# Patient Record
Sex: Female | Born: 1935 | Race: Black or African American | Hispanic: No | Smoking: Never smoker
Health system: Southern US, Community
[De-identification: ages and names within clinical notes are randomized; demographics above are authoritative.]

## PROBLEM LIST (undated history)

## (undated) DIAGNOSIS — M1990 Unspecified osteoarthritis, unspecified site: Secondary | ICD-10-CM

## (undated) DIAGNOSIS — F0390 Unspecified dementia without behavioral disturbance: Secondary | ICD-10-CM

## (undated) DIAGNOSIS — E079 Disorder of thyroid, unspecified: Secondary | ICD-10-CM

## (undated) DIAGNOSIS — C50919 Malignant neoplasm of unspecified site of unspecified female breast: Secondary | ICD-10-CM

## (undated) DIAGNOSIS — N289 Disorder of kidney and ureter, unspecified: Secondary | ICD-10-CM

## (undated) DIAGNOSIS — L209 Atopic dermatitis, unspecified: Secondary | ICD-10-CM

## (undated) DIAGNOSIS — E119 Type 2 diabetes mellitus without complications: Secondary | ICD-10-CM

## (undated) DIAGNOSIS — I4891 Unspecified atrial fibrillation: Secondary | ICD-10-CM

## (undated) DIAGNOSIS — I10 Essential (primary) hypertension: Secondary | ICD-10-CM

## (undated) DIAGNOSIS — F32A Depression, unspecified: Secondary | ICD-10-CM

## (undated) DIAGNOSIS — F329 Major depressive disorder, single episode, unspecified: Secondary | ICD-10-CM

## (undated) DIAGNOSIS — I509 Heart failure, unspecified: Secondary | ICD-10-CM

## (undated) HISTORY — PX: OTHER SURGICAL HISTORY: SHX169

## (undated) HISTORY — PX: MASTECTOMY: SHX3

## (undated) HISTORY — PX: HIP SURGERY: SHX245

---

## 2003-11-28 ENCOUNTER — Ambulatory Visit: Payer: Self-pay | Admitting: Oncology

## 2004-02-18 ENCOUNTER — Ambulatory Visit: Payer: Self-pay | Admitting: Oncology

## 2004-02-28 ENCOUNTER — Ambulatory Visit: Payer: Self-pay | Admitting: Oncology

## 2004-06-09 ENCOUNTER — Emergency Department: Payer: Self-pay | Admitting: Emergency Medicine

## 2004-06-16 ENCOUNTER — Ambulatory Visit: Payer: Self-pay | Admitting: Oncology

## 2004-06-24 ENCOUNTER — Ambulatory Visit: Payer: Self-pay | Admitting: Oncology

## 2004-06-27 ENCOUNTER — Ambulatory Visit: Payer: Self-pay | Admitting: Oncology

## 2004-08-12 ENCOUNTER — Ambulatory Visit: Payer: Self-pay | Admitting: Oncology

## 2004-08-18 ENCOUNTER — Ambulatory Visit: Payer: Self-pay | Admitting: Oncology

## 2004-11-17 ENCOUNTER — Ambulatory Visit: Payer: Self-pay | Admitting: Oncology

## 2004-11-27 ENCOUNTER — Ambulatory Visit: Payer: Self-pay | Admitting: Oncology

## 2005-03-02 ENCOUNTER — Ambulatory Visit: Payer: Self-pay | Admitting: Oncology

## 2005-03-30 ENCOUNTER — Ambulatory Visit: Payer: Self-pay | Admitting: Oncology

## 2005-05-11 ENCOUNTER — Ambulatory Visit: Payer: Self-pay | Admitting: Internal Medicine

## 2005-05-29 ENCOUNTER — Ambulatory Visit: Payer: Self-pay

## 2005-07-14 ENCOUNTER — Ambulatory Visit: Payer: Self-pay | Admitting: Oncology

## 2005-07-28 ENCOUNTER — Ambulatory Visit: Payer: Self-pay | Admitting: Oncology

## 2005-08-27 ENCOUNTER — Ambulatory Visit: Payer: Self-pay | Admitting: Oncology

## 2005-12-22 ENCOUNTER — Ambulatory Visit: Payer: Self-pay | Admitting: Oncology

## 2005-12-28 ENCOUNTER — Ambulatory Visit: Payer: Self-pay | Admitting: Oncology

## 2006-04-20 ENCOUNTER — Ambulatory Visit: Payer: Self-pay | Admitting: Oncology

## 2006-04-28 ENCOUNTER — Ambulatory Visit: Payer: Self-pay | Admitting: Oncology

## 2006-05-29 ENCOUNTER — Ambulatory Visit: Payer: Self-pay | Admitting: Unknown Physician Specialty

## 2006-08-24 ENCOUNTER — Ambulatory Visit: Payer: Self-pay | Admitting: Internal Medicine

## 2006-10-12 ENCOUNTER — Ambulatory Visit: Payer: Self-pay | Admitting: Internal Medicine

## 2006-10-19 ENCOUNTER — Ambulatory Visit: Payer: Self-pay | Admitting: Oncology

## 2006-10-29 ENCOUNTER — Ambulatory Visit: Payer: Self-pay | Admitting: Oncology

## 2006-10-29 ENCOUNTER — Ambulatory Visit: Payer: Self-pay | Admitting: Internal Medicine

## 2006-11-16 ENCOUNTER — Ambulatory Visit: Payer: Self-pay | Admitting: Unknown Physician Specialty

## 2006-11-30 ENCOUNTER — Inpatient Hospital Stay: Payer: Self-pay | Admitting: Unknown Physician Specialty

## 2006-12-05 ENCOUNTER — Encounter: Payer: Self-pay | Admitting: Internal Medicine

## 2007-03-31 ENCOUNTER — Ambulatory Visit: Payer: Self-pay | Admitting: Internal Medicine

## 2007-04-22 ENCOUNTER — Ambulatory Visit: Payer: Self-pay | Admitting: Oncology

## 2007-04-28 ENCOUNTER — Ambulatory Visit: Payer: Self-pay | Admitting: Internal Medicine

## 2007-05-21 ENCOUNTER — Ambulatory Visit: Payer: Self-pay | Admitting: Internal Medicine

## 2007-05-29 ENCOUNTER — Ambulatory Visit: Payer: Self-pay | Admitting: Internal Medicine

## 2007-08-26 ENCOUNTER — Ambulatory Visit: Payer: Self-pay | Admitting: Internal Medicine

## 2008-06-08 ENCOUNTER — Ambulatory Visit: Payer: Self-pay | Admitting: Family

## 2008-08-26 ENCOUNTER — Ambulatory Visit: Payer: Self-pay | Admitting: Internal Medicine

## 2008-09-01 ENCOUNTER — Ambulatory Visit: Payer: Self-pay | Admitting: Internal Medicine

## 2009-08-19 ENCOUNTER — Ambulatory Visit: Payer: Self-pay | Admitting: Internal Medicine

## 2009-08-24 ENCOUNTER — Ambulatory Visit: Payer: Self-pay | Admitting: Internal Medicine

## 2009-08-27 ENCOUNTER — Ambulatory Visit: Payer: Self-pay | Admitting: Oncology

## 2009-09-02 ENCOUNTER — Ambulatory Visit: Payer: Self-pay | Admitting: Internal Medicine

## 2009-09-15 ENCOUNTER — Ambulatory Visit: Payer: Self-pay | Admitting: Oncology

## 2009-09-17 LAB — CANCER ANTIGEN 27.29: CA 27.29: 23.1 U/mL

## 2009-09-27 ENCOUNTER — Ambulatory Visit: Payer: Self-pay | Admitting: Oncology

## 2009-09-28 ENCOUNTER — Ambulatory Visit: Payer: Self-pay | Admitting: Oncology

## 2010-09-07 ENCOUNTER — Ambulatory Visit: Payer: Self-pay | Admitting: Internal Medicine

## 2010-11-03 ENCOUNTER — Ambulatory Visit: Payer: Self-pay | Admitting: Oncology

## 2010-11-04 LAB — CANCER ANTIGEN 27.29: CA 27.29: 23.5 U/mL (ref 0.0–38.6)

## 2010-11-28 ENCOUNTER — Ambulatory Visit: Payer: Self-pay | Admitting: Oncology

## 2011-01-06 ENCOUNTER — Emergency Department: Payer: Self-pay | Admitting: Emergency Medicine

## 2011-04-12 ENCOUNTER — Ambulatory Visit: Payer: Self-pay | Admitting: Gastroenterology

## 2011-04-17 LAB — PATHOLOGY REPORT

## 2011-05-27 ENCOUNTER — Emergency Department: Payer: Self-pay | Admitting: *Deleted

## 2011-06-05 ENCOUNTER — Emergency Department: Payer: Self-pay | Admitting: Emergency Medicine

## 2011-06-05 LAB — COMPREHENSIVE METABOLIC PANEL
Anion Gap: 9 (ref 7–16)
BUN: 40 mg/dL — ABNORMAL HIGH (ref 7–18)
Bilirubin,Total: 0.4 mg/dL (ref 0.2–1.0)
Calcium, Total: 9.4 mg/dL (ref 8.5–10.1)
Co2: 27 mmol/L (ref 21–32)
EGFR (Non-African Amer.): 26 — ABNORMAL LOW
Glucose: 394 mg/dL — ABNORMAL HIGH (ref 65–99)
SGPT (ALT): 57 U/L
Sodium: 132 mmol/L — ABNORMAL LOW (ref 136–145)

## 2011-06-05 LAB — CBC
HCT: 35.5 % (ref 35.0–47.0)
MCV: 100 fL (ref 80–100)
Platelet: 201 10*3/uL (ref 150–440)
RDW: 13.8 % (ref 11.5–14.5)

## 2011-06-07 ENCOUNTER — Ambulatory Visit: Payer: Self-pay | Admitting: Oncology

## 2011-06-08 ENCOUNTER — Inpatient Hospital Stay: Payer: Self-pay | Admitting: Internal Medicine

## 2011-06-08 LAB — COMPREHENSIVE METABOLIC PANEL
Albumin: 4 g/dL (ref 3.4–5.0)
Alkaline Phosphatase: 119 U/L (ref 50–136)
Anion Gap: 11 (ref 7–16)
Calcium, Total: 8.8 mg/dL (ref 8.5–10.1)
Chloride: 94 mmol/L — ABNORMAL LOW (ref 98–107)
Co2: 27 mmol/L (ref 21–32)
EGFR (African American): 18 — ABNORMAL LOW
EGFR (Non-African Amer.): 16 — ABNORMAL LOW
Potassium: 5 mmol/L (ref 3.5–5.1)
SGOT(AST): 37 U/L (ref 15–37)
SGPT (ALT): 50 U/L
Sodium: 132 mmol/L — ABNORMAL LOW (ref 136–145)
Total Protein: 7.6 g/dL (ref 6.4–8.2)

## 2011-06-08 LAB — CBC
HGB: 10.8 g/dL — ABNORMAL LOW (ref 12.0–16.0)
MCH: 32.1 pg (ref 26.0–34.0)
MCHC: 31.8 g/dL — ABNORMAL LOW (ref 32.0–36.0)
MCV: 101 fL — ABNORMAL HIGH (ref 80–100)
Platelet: 181 10*3/uL (ref 150–440)

## 2011-06-08 LAB — URINALYSIS, COMPLETE
Ph: 5 (ref 4.5–8.0)
RBC,UR: 1 /HPF (ref 0–5)
Specific Gravity: 1.016 (ref 1.003–1.030)
Squamous Epithelial: <1

## 2011-06-08 LAB — CK TOTAL AND CKMB (NOT AT ARMC)
CK, Total: 78 U/L (ref 21–215)
CK-MB: 0.6 ng/mL (ref 0.5–3.6)

## 2011-06-08 LAB — HEMOGLOBIN A1C: Hemoglobin A1C: 12 % — ABNORMAL HIGH (ref 4.2–6.3)

## 2011-06-08 LAB — TROPONIN I: Troponin-I: <0.02 ng/mL

## 2011-06-09 LAB — LIPID PANEL
Cholesterol: 162 mg/dL (ref 0–200)
HDL Cholesterol: 38 mg/dL — ABNORMAL LOW (ref 40–60)

## 2011-06-09 LAB — CBC WITH DIFFERENTIAL/PLATELET
Basophil %: 0.2 %
Eosinophil #: 0 10*3/uL (ref 0.0–0.7)
HCT: 28.8 % — ABNORMAL LOW (ref 35.0–47.0)
HGB: 9.4 g/dL — ABNORMAL LOW (ref 12.0–16.0)
MCHC: 32.6 g/dL (ref 32.0–36.0)
Monocyte #: 0.4 x10 3/mm (ref 0.2–0.9)
Neutrophil #: 3.2 10*3/uL (ref 1.4–6.5)
Neutrophil %: 61 %
RBC: 2.9 10*6/uL — ABNORMAL LOW (ref 3.80–5.20)

## 2011-06-09 LAB — MAGNESIUM: Magnesium: 1.5 mg/dL — ABNORMAL LOW

## 2011-06-09 LAB — BASIC METABOLIC PANEL
Anion Gap: 11 (ref 7–16)
Co2: 22 mmol/L (ref 21–32)
EGFR (African American): 27 — ABNORMAL LOW
EGFR (Non-African Amer.): 23 — ABNORMAL LOW
Glucose: 103 mg/dL — ABNORMAL HIGH (ref 65–99)
Osmolality: 279 (ref 275–301)
Potassium: 4.3 mmol/L (ref 3.5–5.1)

## 2011-06-10 LAB — BASIC METABOLIC PANEL
BUN: 39 mg/dL — ABNORMAL HIGH (ref 7–18)
Calcium, Total: 8.4 mg/dL — ABNORMAL LOW (ref 8.5–10.1)
Chloride: 103 mmol/L (ref 98–107)
Co2: 23 mmol/L (ref 21–32)
EGFR (African American): 28 — ABNORMAL LOW
EGFR (Non-African Amer.): 24 — ABNORMAL LOW
Osmolality: 285 (ref 275–301)

## 2011-06-11 LAB — BASIC METABOLIC PANEL
Anion Gap: 9 (ref 7–16)
Co2: 24 mmol/L (ref 21–32)
Creatinine: 1.96 mg/dL — ABNORMAL HIGH (ref 0.60–1.30)
EGFR (Non-African Amer.): 23 — ABNORMAL LOW
Osmolality: 285 (ref 275–301)

## 2011-06-28 ENCOUNTER — Ambulatory Visit: Payer: Self-pay | Admitting: Oncology

## 2011-07-04 ENCOUNTER — Ambulatory Visit: Payer: Self-pay | Admitting: Oncology

## 2011-07-29 ENCOUNTER — Ambulatory Visit: Payer: Self-pay | Admitting: Oncology

## 2011-09-25 ENCOUNTER — Ambulatory Visit: Payer: Self-pay | Admitting: Internal Medicine

## 2012-04-27 ENCOUNTER — Ambulatory Visit: Payer: Self-pay | Admitting: Oncology

## 2012-05-21 LAB — CBC CANCER CENTER
Basophil #: 0 x10 3/mm (ref 0.0–0.1)
Basophil %: 0.3 %
Eosinophil #: 0 x10 3/mm (ref 0.0–0.7)
Eosinophil %: 0.9 %
HGB: 11.3 g/dL — ABNORMAL LOW (ref 12.0–16.0)
Lymphocyte #: 1.3 x10 3/mm (ref 1.0–3.6)
Lymphocyte %: 28.6 %
MCHC: 33.5 g/dL (ref 32.0–36.0)
MCV: 99 fL (ref 80–100)
Neutrophil %: 62.6 %
RDW: 14.4 % (ref 11.5–14.5)
WBC: 4.5 x10 3/mm (ref 3.6–11.0)

## 2012-05-21 LAB — COMPREHENSIVE METABOLIC PANEL
Albumin: 3.6 g/dL (ref 3.4–5.0)
BUN: 48 mg/dL — ABNORMAL HIGH (ref 7–18)
Calcium, Total: 8.9 mg/dL (ref 8.5–10.1)
Chloride: 102 mmol/L (ref 98–107)
Creatinine: 2.09 mg/dL — ABNORMAL HIGH (ref 0.60–1.30)
EGFR (African American): 24 — ABNORMAL LOW
Potassium: 4.3 mmol/L (ref 3.5–5.1)
SGPT (ALT): 34 U/L (ref 12–78)
Sodium: 138 mmol/L (ref 136–145)
Total Protein: 7.6 g/dL (ref 6.4–8.2)

## 2012-05-28 ENCOUNTER — Ambulatory Visit: Payer: Self-pay | Admitting: Oncology

## 2012-09-25 ENCOUNTER — Ambulatory Visit: Payer: Self-pay | Admitting: Oncology

## 2012-10-17 ENCOUNTER — Ambulatory Visit: Payer: Self-pay | Admitting: Oncology

## 2012-10-24 ENCOUNTER — Emergency Department: Payer: Self-pay | Admitting: Emergency Medicine

## 2012-10-24 LAB — URINALYSIS, COMPLETE
Bacteria: NONE SEEN
Bilirubin,UR: NEGATIVE
Nitrite: NEGATIVE
Protein: NEGATIVE
RBC,UR: NONE SEEN /HPF (ref 0–5)
Squamous Epithelial: 1
WBC UR: 1 /HPF (ref 0–5)

## 2012-10-24 LAB — COMPREHENSIVE METABOLIC PANEL
Anion Gap: 7 (ref 7–16)
BUN: 44 mg/dL — ABNORMAL HIGH (ref 7–18)
Bilirubin,Total: 0.3 mg/dL (ref 0.2–1.0)
Calcium, Total: 8.7 mg/dL (ref 8.5–10.1)
Chloride: 102 mmol/L (ref 98–107)
Co2: 27 mmol/L (ref 21–32)
Creatinine: 1.8 mg/dL — ABNORMAL HIGH (ref 0.60–1.30)
Glucose: 240 mg/dL — ABNORMAL HIGH (ref 65–99)
Potassium: 4.2 mmol/L (ref 3.5–5.1)
SGOT(AST): 30 U/L (ref 15–37)

## 2012-10-24 LAB — CBC
HCT: 29.3 % — ABNORMAL LOW (ref 35.0–47.0)
MCH: 33 pg (ref 26.0–34.0)
MCHC: 33.8 g/dL (ref 32.0–36.0)
Platelet: 185 10*3/uL (ref 150–440)
RBC: 3 10*6/uL — ABNORMAL LOW (ref 3.80–5.20)
RDW: 14.8 % — ABNORMAL HIGH (ref 11.5–14.5)
WBC: 3.2 10*3/uL — ABNORMAL LOW (ref 3.6–11.0)

## 2012-10-24 LAB — TROPONIN I: Troponin-I: <0.02 ng/mL

## 2012-12-01 ENCOUNTER — Emergency Department: Payer: Self-pay | Admitting: Emergency Medicine

## 2013-04-06 ENCOUNTER — Emergency Department: Payer: Self-pay | Admitting: Emergency Medicine

## 2013-04-06 LAB — CBC WITH DIFFERENTIAL/PLATELET
BANDS NEUTROPHIL: 7 %
BASOS ABS: 1 %
Eosinophil: 1 %
HCT: 31.3 % — AB (ref 35.0–47.0)
HGB: 10.4 g/dL — ABNORMAL LOW (ref 12.0–16.0)
Lymphocytes: 4 %
MCH: 32.7 pg (ref 26.0–34.0)
MCHC: 33.4 g/dL (ref 32.0–36.0)
MCV: 98 fL (ref 80–100)
MONOS PCT: 7 %
Platelet: 156 10*3/uL (ref 150–440)
RBC: 3.19 10*6/uL — AB (ref 3.80–5.20)
RDW: 14.5 % (ref 11.5–14.5)
Segmented Neutrophils: 80 %
WBC: 4.9 10*3/uL (ref 3.6–11.0)

## 2013-04-06 LAB — URINALYSIS, COMPLETE
Bacteria: NEGATIVE
Bilirubin,UR: NEGATIVE
Glucose,UR: NEGATIVE mg/dL (ref 0–75)
Ketone: NEGATIVE
Leukocyte Esterase: NEGATIVE
Nitrite: NEGATIVE
Ph: 5 (ref 4.5–8.0)
Protein: 100
Specific Gravity: 1.013 (ref 1.003–1.030)

## 2013-04-06 LAB — HEPATIC FUNCTION PANEL A (ARMC)
AST: 21 U/L (ref 15–37)
Albumin: 3.2 g/dL — ABNORMAL LOW (ref 3.4–5.0)
Alkaline Phosphatase: 69 U/L
Bilirubin, Direct: 0.1 mg/dL (ref 0.00–0.20)
Bilirubin,Total: 0.2 mg/dL (ref 0.2–1.0)
SGPT (ALT): 28 U/L (ref 12–78)
Total Protein: 7.2 g/dL (ref 6.4–8.2)

## 2013-04-06 LAB — BASIC METABOLIC PANEL
ANION GAP: 5 — AB (ref 7–16)
BUN: 40 mg/dL — ABNORMAL HIGH (ref 7–18)
CALCIUM: 8.5 mg/dL (ref 8.5–10.1)
Chloride: 105 mmol/L (ref 98–107)
Co2: 25 mmol/L (ref 21–32)
Creatinine: 1.87 mg/dL — ABNORMAL HIGH (ref 0.60–1.30)
EGFR (African American): 27 — ABNORMAL LOW
EGFR (Non-African Amer.): 23 — ABNORMAL LOW
GLUCOSE: 112 mg/dL — AB (ref 65–99)
OSMOLALITY: 281 (ref 275–301)
Potassium: 4 mmol/L (ref 3.5–5.1)
Sodium: 135 mmol/L — ABNORMAL LOW (ref 136–145)

## 2013-04-06 LAB — TROPONIN I: Troponin-I: <0.02 ng/mL

## 2013-04-06 LAB — MAGNESIUM: Magnesium: 1.6 mg/dL — ABNORMAL LOW

## 2013-04-06 LAB — LIPASE, BLOOD: Lipase: 78 U/L (ref 73–393)

## 2013-05-06 ENCOUNTER — Ambulatory Visit: Payer: Self-pay | Admitting: Ophthalmology

## 2013-05-19 ENCOUNTER — Ambulatory Visit: Payer: Self-pay | Admitting: Ophthalmology

## 2013-05-20 ENCOUNTER — Ambulatory Visit: Payer: Self-pay | Admitting: Oncology

## 2013-05-21 LAB — COMPREHENSIVE METABOLIC PANEL
ALT: 32 U/L (ref 12–78)
Albumin: 3.2 g/dL — ABNORMAL LOW (ref 3.4–5.0)
Alkaline Phosphatase: 72 U/L
Anion Gap: 9 (ref 7–16)
BUN: 45 mg/dL — AB (ref 7–18)
Bilirubin,Total: 0.2 mg/dL (ref 0.2–1.0)
CALCIUM: 9 mg/dL (ref 8.5–10.1)
CHLORIDE: 104 mmol/L (ref 98–107)
CREATININE: 1.76 mg/dL — AB (ref 0.60–1.30)
Co2: 27 mmol/L (ref 21–32)
EGFR (African American): 29 — ABNORMAL LOW
GFR CALC NON AF AMER: 25 — AB
GLUCOSE: 132 mg/dL — AB (ref 65–99)
Osmolality: 293 (ref 275–301)
POTASSIUM: 4.4 mmol/L (ref 3.5–5.1)
SGOT(AST): 26 U/L (ref 15–37)
Sodium: 140 mmol/L (ref 136–145)
Total Protein: 7.4 g/dL (ref 6.4–8.2)

## 2013-05-21 LAB — CBC CANCER CENTER
Basophil #: 0 x10 3/mm (ref 0.0–0.1)
Basophil %: 0.7 %
Eosinophil #: 0.1 x10 3/mm (ref 0.0–0.7)
Eosinophil %: 2.9 %
HCT: 33.1 % — ABNORMAL LOW (ref 35.0–47.0)
HGB: 10.6 g/dL — AB (ref 12.0–16.0)
LYMPHS ABS: 1.5 x10 3/mm (ref 1.0–3.6)
Lymphocyte %: 34.9 %
MCH: 31.4 pg (ref 26.0–34.0)
MCHC: 32 g/dL (ref 32.0–36.0)
MCV: 98 fL (ref 80–100)
MONO ABS: 0.3 x10 3/mm (ref 0.2–0.9)
Monocyte %: 8 %
Neutrophil #: 2.2 x10 3/mm (ref 1.4–6.5)
Neutrophil %: 53.5 %
PLATELETS: 208 x10 3/mm (ref 150–440)
RBC: 3.37 10*6/uL — ABNORMAL LOW (ref 3.80–5.20)
RDW: 15 % — ABNORMAL HIGH (ref 11.5–14.5)
WBC: 4.2 x10 3/mm (ref 3.6–11.0)

## 2013-05-28 ENCOUNTER — Ambulatory Visit: Payer: Self-pay | Admitting: Oncology

## 2013-06-27 ENCOUNTER — Ambulatory Visit: Payer: Self-pay | Admitting: Oncology

## 2014-05-19 DIAGNOSIS — I10 Essential (primary) hypertension: Secondary | ICD-10-CM | POA: Diagnosis not present

## 2014-05-19 DIAGNOSIS — R609 Edema, unspecified: Secondary | ICD-10-CM | POA: Diagnosis not present

## 2014-05-19 DIAGNOSIS — E119 Type 2 diabetes mellitus without complications: Secondary | ICD-10-CM | POA: Diagnosis not present

## 2014-05-19 DIAGNOSIS — D485 Neoplasm of uncertain behavior of skin: Secondary | ICD-10-CM | POA: Diagnosis not present

## 2014-06-05 DIAGNOSIS — C50919 Malignant neoplasm of unspecified site of unspecified female breast: Secondary | ICD-10-CM | POA: Diagnosis not present

## 2014-06-11 DIAGNOSIS — C44622 Squamous cell carcinoma of skin of right upper limb, including shoulder: Secondary | ICD-10-CM | POA: Diagnosis not present

## 2014-06-20 NOTE — Op Note (Signed)
PATIENT NAME:  Paige James, Paige James MR#:  [id number] DATE OF BIRTH:  1920/12/14  DATE OF PROCEDURE:  05/19/2013  Patient name Paige James [id number].   PREOPERATIVE DIAGNOSIS: Cataract, left eye.   POSTOPERATIVE DIAGNOSIS:  Cataract, left eye.  PROCEDURE PERFORMED: Extracapsular cataract extraction using phacoemulsification with placement of Alcon [id number], 22.0 diopter posterior chamber lens, serial number [id number].   SURGEON: Loura Back. Corney Knighton, M.D.   ANESTHESIA: 4% lidocaine and 0.75% Marcaine a 50-50 mixture with 10 units/mL of Hylenex added, given as a peribulbar.   ANESTHESIOLOGIST: Dr. [place] Service.   COMPLICATIONS: None.   ESTIMATED BLOOD LOSS: Less than 1 mL.   DESCRIPTION OF PROCEDURE: The patient was brought to the Operating Room and given IV sedation and peribulbar block. She was prepped and draped in the usual fashion. The vertical rectus muscles were imbricated using 5-0 silk sutures, bridle sutures. Hemostasis was obtained for 1 clock hour at the superior limbus and a partial-thickness scleral groove was dissected anteriorly into clear cornea with an Alcon crescent knife. The anterior chamber was entered superonasally and superotemporally through clear cornea with a paracentesis knife and air was used to replace the aqueous. Vision blue was used to paint the anterior capsule and then the air was replaced with balanced salt. The anterior chamber was entered superiorly through the lamellar dissection with a 2.6 mm keratome. DisCoVisc was used to place the aqueous and a continuous-tear circular capsulorrhexis was carried out. Hydrodissection was used to loosen the nucleus and phacoemulsification was carried out with a divide-and-conquer technique. Ultrasound time was 1 minute and 33 seconds, average power was 22.9% and CDE was 33.66. Irrigation-aspiration was used to remove the residual cortex. The capsular bag was inflated with DisCoVisc and the intraocular lens was inserted  in the capsular bag under direct visualization. Irrigation-aspiration was used to remove the residual DisCoVisc and the wound was inflated with balanced salt. Miostat was injected via the paracentesis track and a 0.1 mL of cefuroxime was also injected via the paracentesis track in 1 mg of fluid. The wound was checked for leaks. None were found. The bridle sutures were removed and 2 drops of Vigamox were placed in the eye. A shield was placed on the eye and the patient was discharged to the recovery room in good condition.     ____________________________ Loura Back Gagandeep Pettet, MD sad:cs D: 05/19/2013 13:57:09 ET T: 05/19/2013 15:56:04 ET JOB#: [id number]  cc: Remo Lipps A. Ellianna Ruest, MD, <Dictator> Martie Lee MD ELECTRONICALLY SIGNED 05/26/2013 13:28

## 2014-06-21 NOTE — Discharge Summary (Signed)
PATIENT NAME:  Paige James, Paige James MR#:  [id number] DATE OF BIRTH:  08/18/1922  DATE OF ADMISSION:  06/08/2011 DATE OF DISCHARGE:  06/12/2011  ADMITTING PHYSICIAN: Gladstone Lighter, MD   DISCHARGING PHYSICIAN: Gladstone Lighter, MD   PRIMARY MD: Clayborn Bigness, MD   PRIMARY ONCOLOGIST: Forest Gleason, MD   [place]: None.    DISCHARGE DIAGNOSES:  1. Metabolic encephalopathy on admission secondary to elevated blood glucose.  2. Acute renal failure.  3. Uncontrolled diabetes mellitus with HbA1c of 12.  4. Hypertension.  5. Hypothyroidism.   6. Right neck mass.  7. History of breast cancer.  8. Chronic kidney disease with baseline creatinine of 1.8.   DISCHARGE MEDICATIONS:  1. Norvasc 5 mg p.o. daily.  2. Celexa 10 mg p.o. daily.  3. Actos 30 mg p.o. daily.  4. Levothyroxine 100 mcg p.o. daily.  5. Glipizide 10 mg p.o. q.a.m. and 5 mg p.o. at bedtime.  6. Magnesium oxide 400 mg p.o. daily.   DISCHARGE DIET: Low sodium, ADA diet.   DISCHARGE ACTIVITY: As tolerated.    FOLLOW-UP INSTRUCTIONS:  1. Follow-up with PCP 1 to 2 weeks. 2. Follow-up with Dr. Oliva Bustard in two weeks.   LABS AND IMAGING STUDIES: Sodium 138, potassium 4.3, chloride 105, bicarb 24, BUN 40, creatinine 1.96, glucose 98, calcium 8.1. WBC 5.3, hemoglobin 9.4, hematocrit 28.8, platelet count 165. Serum magnesium 1.5 on admission. LDL 87, HDL 38, triglycerides 194, total cholesterol of 162.   CT of the head without contrast for altered mental status showing chronic involutional changes without any acute abnormalities. Urinalysis negative for any infection.   BRIEF HOSPITAL COURSE: Paige James is an 81 year old elderly African American female with past medical history significant for metastatic breast cancer status post right-sided mastectomy with chemoradiation in the past, follows up with Dr. Oliva Bustard, hypertension, noncompliant with meds for diabetes who presented to the hospital from Dr. Metro Kung  office after being confused and altered and blood sugars of 400 in his office. Her creatinine was also 2.6 on presentation with baseline creatinine is around 1.8 to 1.9.  1. Metabolic encephalopathy secondary to dehydration, acute renal failure, and also hyperglycemia.  2. Uncontrolled diabetes mellitus. HbA1c was 12.0. She was only on Actos at home. She was on Amaryl in the past but looks like she has not been taking it currently. She was put back on glipizide at 10 mg in the morning and 5 mg at bedtime and also Actos dose was increased to 30 mg p.o. daily which caused better control of blood sugar and she is being discharged on the same.  3. Hypertension. Continue Norvasc.  4. History of metastatic breast cancer with recent right neck mass in the aorta caval region seen on CT of the neck done for dysphagia. She was seen by Dr. Oliva Bustard and he wanted to get PET scan to see if breast cancer was coming back. PET scan was done in the hospital on 06/12/2011 prior to discharge, the results of which are not available but will follow-up with Dr. Oliva Bustard in the next 1 to 2 weeks.   Her course has been otherwise uneventful in the hospital. She worked with physical therapy who recommended home health but the patient says she walks usually pretty good and drives herself. She is not homebound and she refused home health. She is being discharged home.   DISCHARGE CONDITION: Stable.   DISCHARGE DISPOSITION: Home.   TIME SPENT ON DISCHARGE: 40 minutes.  ____________________________ Gladstone Lighter, MD rk:drc  D: 06/12/2011 15:53:13 ET T: 06/13/2011 14:18:08 ET JOB#: [id number]  cc: Gladstone Lighter, MD, <Dictator> Lavera Guise, MD Martie Lee. Oliva Bustard, MD Gladstone Lighter MD ELECTRONICALLY SIGNED 06/13/2011 15:06

## 2014-06-21 NOTE — H&P (Signed)
PATIENT NAME:  Paige James, SANAGUSTIN MR#:  [id number] DATE OF BIRTH:  08/18/1922  DATE OF ADMISSION:  06/08/2011  ADMITTING PHYSICIAN: Dr. Gladstone Lighter.  PRIMARY CARE PHYSICIAN: Dr. Clayborn Bigness. PRIMARY ONCOLOGIST: Dr. Oliva Bustard.   CHIEF COMPLAINT: Sent in from oncology office for altered mental status and hyperglycemia.   HISTORY OF PRESENT ILLNESS: Paige James is an 81 year old elderly African American female with past medical history significant for history of metastatic breast cancer, status post right-sided mastectomy with chemotherapy and radiation, currently negative for malignancy, history of diabetes, hypertension, hypothyroidism, who was sent in from Dr. Metro Kung office for hyperglycemia and altered mental status. The patient followed up with Dr. Oliva Bustard today for routine breast cancer follow-up and was found to be in remission. She is also being seen by Dr. Tamala Julian for abnormal mass on the right side of the neck and also dysphagia recently. However, she was found to be a little confused during her visit this morning and her fingerstick was found to be 400, so she was sent to the Emergency Room. Her blood glucose on BMP in the ER was 540 with acute on chronic renal failure with creatinine at 2.6 with baseline around 1.8. She is being admitted for the above complaints. Her mental status is starting to clear up with fluids.   PAST MEDICAL HISTORY:  1. Hypertension.  2. Hypothyroidism.  3. Non-insulin-dependent diabetes mellitus.  4. Breast cancer status post surgery chemotherapy and radiation  5. Migraine headaches.   PAST SURGICAL HISTORY:  1. Cataract surgery.  2. Right-sided mastectomy.  3. Right knee surgery.  4. Left knee total knee replacement.  5. Hip surgery.   ALLERGIES TO MEDICATIONS: No known drug allergies.   CURRENT MEDICATIONS:  1. Triamcinolone ointment as needed for rash.  2. Protonix 40 mg p.o. daily.  3. Actos 15 mg p.o. daily.  4. Synthroid 100 mcg p.o. daily.   5. Norvasc 5 mg p.o. daily.  6. Oxybutynin 10 mg p.o. daily.  7. Loratadine 5 mg p.o. daily.  8. Topical cream apply once a day for rash 9. Amaryl 4 mg in the morning and 2 mg at night. Not sure if the patient is still on this or not.  SOCIAL HISTORY: Lives at home by herself. No history of any smoking or alcohol use.   FAMILY HISTORY: The patient states one of her daughters had colon cancer, the other one had leukemia and brother with prostate cancer.    REVIEW OF SYSTEMS: CONSTITUTIONAL: No fever, fatigue, or weakness. EYES: Positive for blurred vision. No glaucoma. Positive for cataracts. ENT: No tinnitus, ear pain, hearing loss, epistaxis or discharge. RESPIRATORY: No cough, wheeze, hemoptysis, or chronic obstructive pulmonary disease. CARDIOVASCULAR: No chest pain, orthopnea, edema, arrhythmia, palpitations, or syncope. GASTROINTESTINAL: No nausea, vomiting, diarrhea, abdominal pain, hematemesis, or melena. GENITOURINARY: No dysuria, hematuria, frequency or incontinence. ENDOCRINE: No polyuria, nocturia, thyroid problems, heat or cold intolerance. HEMATOLOGY: No anemia, easy bruising or bleeding. SKIN: No acne, rash, or lesions. MUSCULOSKELETAL: No neck, back, or shoulder pain. Positive for hand pain secondary to arthritis. NEUROLOGIC: No numbness, weakness, cerebrovascular accident, transient ischemic attack, or seizures. PSYCHOLOGICAL: No anxiety, insomnia, or depression.   PHYSICAL EXAMINATION:  VITAL SIGNS: Temperature 97.2 degrees F., pulse 83, respirations 18, blood pressure 140/91, pulse ox 100% on room air.    GENERAL: Well built, well nourished female lying in bed, not in any acute distress.   HEENT: Normocephalic, atraumatic. Pupils equal, round, reacting to light. Anicteric sclerae. Extraocular movements intact. Oropharynx  clear without erythema, mass or exudates.   NECK: Supple. No thyromegaly, JVD, or carotid bruits. No lymphadenopathy.   LUNGS: Clear to auscultation.  Decreased bibasilar breath sounds. No wheeze or crackles. No use of accessory muscles for breathing.   CARDIOVASCULAR: S1, S2, regular rate and rhythm. No murmurs, rubs or gallops.   ABDOMEN: Soft, nontender. No hepatosplenomegaly. Normal bowel sounds.  EXTREMITIES: No pedal edema. No clubbing or cyanosis. 2+ dorsalis pedis pulses palpable bilaterally.   SKIN: No acne, rash, or lesions.   LYMPHATICS: No cervical or supraclavicular lymphadenopathy.   NEUROLOGIC: Cranial nerves intact. Follows simple commands. No motor or sensory deficits.   PSYCHOLOGICAL: The patient is awake, alert, oriented x2 with episodes of confusion.   LABORATORY, RADIOLOGICAL AND DIAGNOSTIC DATA: WBC 4.1, hemoglobin 10.8, hematocrit 34.0, platelet count 181. Sodium 132, potassium 5.0, chloride 94, bicarbonate 27, BUN 47, creatinine 2.64, glucose 514, calcium 8.8. ALT 50, AST 37, alkaline phosphatase 119, total bilirubin 0.4, albumin 4.0, troponin less than 0.02. Urinalysis: 1+ leukocyte esterase, 3 WBCs but no bacteria. Serum glucose improved to 189. She had a recent CT of the neck down on 06/05/2011 showing nonobstructing calculus is inflammatory in the left kidney, a mass within the aortocaval area in the abdomen cannot be excluded. No evidence of free fluid or pneumoperitoneum present.   ASSESSMENT AND PLAN: An 81 year old female with past medical history of breast cancer status post right mastectomy, chemotherapy and radiation following with Dr. Oliva Bustard, history of diabetes, hypertension, sent in for confusion and fingerstick of 400 from oncology clinic.  1. Altered mental status, likely metabolic encephalopathy from acute renal failure and hyperglycemia, seems to be improving now. IV fluids. Recheck labs and also get a CT of the head.  2. Diabetes mellitus with hyperglycemia. Hemoglobin A1c ordered. Her sugars improved after insulin dose in the ED. The patient is on Amaryl and Actos at home. Not sure if they are updated  from now or not, started her on low dose glipizide and also increased her Actos dose. Follow-up sugars and adjust dosage and will get a dietary consult.  3. Acute renal failure on CKD stage III, prerenal. IV fluids. Recheck in a.m. Baseline creatinine seems to be 1.8.  4. Hypothyroidism. Continue Synthroid. Follow-up TSH.  5. Hypertension. Norvasc.  6. Gastrointestinal and deep vein thrombosis prophylaxis. On Protonix and subcutaneous heparin.  7. CODE STATUS: FULL CODE.   TIME SPENT ON ADMISSION: 50 minutes.    ____________________________ Gladstone Lighter, MD rk:ap D: 06/08/2011 16:33:55 ET T: 06/08/2011 17:02:17 ET JOB#: [id number]  cc: Gladstone Lighter, MD, <Dictator> Janak K. Oliva Bustard, MD Gladstone Lighter MD ELECTRONICALLY SIGNED 06/09/2011 14:29

## 2014-07-20 DIAGNOSIS — I10 Essential (primary) hypertension: Secondary | ICD-10-CM | POA: Diagnosis not present

## 2014-07-20 DIAGNOSIS — N189 Chronic kidney disease, unspecified: Secondary | ICD-10-CM | POA: Diagnosis not present

## 2014-07-20 DIAGNOSIS — N184 Chronic kidney disease, stage 4 (severe): Secondary | ICD-10-CM | POA: Diagnosis not present

## 2014-07-20 DIAGNOSIS — D631 Anemia in chronic kidney disease: Secondary | ICD-10-CM | POA: Diagnosis not present

## 2014-07-21 DIAGNOSIS — I129 Hypertensive chronic kidney disease with stage 1 through stage 4 chronic kidney disease, or unspecified chronic kidney disease: Secondary | ICD-10-CM | POA: Diagnosis not present

## 2014-07-21 DIAGNOSIS — R609 Edema, unspecified: Secondary | ICD-10-CM | POA: Diagnosis not present

## 2014-07-21 DIAGNOSIS — E119 Type 2 diabetes mellitus without complications: Secondary | ICD-10-CM | POA: Diagnosis not present

## 2014-07-21 DIAGNOSIS — E039 Hypothyroidism, unspecified: Secondary | ICD-10-CM | POA: Diagnosis not present

## 2014-07-21 DIAGNOSIS — N184 Chronic kidney disease, stage 4 (severe): Secondary | ICD-10-CM | POA: Diagnosis not present

## 2014-07-21 DIAGNOSIS — R635 Abnormal weight gain: Secondary | ICD-10-CM | POA: Diagnosis not present

## 2014-07-21 DIAGNOSIS — I10 Essential (primary) hypertension: Secondary | ICD-10-CM | POA: Diagnosis not present

## 2014-10-19 DIAGNOSIS — N184 Chronic kidney disease, stage 4 (severe): Secondary | ICD-10-CM | POA: Diagnosis not present

## 2014-10-19 DIAGNOSIS — I10 Essential (primary) hypertension: Secondary | ICD-10-CM | POA: Diagnosis not present

## 2014-10-19 DIAGNOSIS — I129 Hypertensive chronic kidney disease with stage 1 through stage 4 chronic kidney disease, or unspecified chronic kidney disease: Secondary | ICD-10-CM | POA: Diagnosis not present

## 2014-11-03 DIAGNOSIS — N184 Chronic kidney disease, stage 4 (severe): Secondary | ICD-10-CM | POA: Diagnosis not present

## 2014-11-03 DIAGNOSIS — I129 Hypertensive chronic kidney disease with stage 1 through stage 4 chronic kidney disease, or unspecified chronic kidney disease: Secondary | ICD-10-CM | POA: Diagnosis not present

## 2014-11-03 DIAGNOSIS — E872 Acidosis: Secondary | ICD-10-CM | POA: Diagnosis not present

## 2014-11-12 DIAGNOSIS — I10 Essential (primary) hypertension: Secondary | ICD-10-CM | POA: Diagnosis not present

## 2014-11-12 DIAGNOSIS — Z0001 Encounter for general adult medical examination with abnormal findings: Secondary | ICD-10-CM | POA: Diagnosis not present

## 2014-11-12 DIAGNOSIS — E039 Hypothyroidism, unspecified: Secondary | ICD-10-CM | POA: Diagnosis not present

## 2014-11-12 DIAGNOSIS — F329 Major depressive disorder, single episode, unspecified: Secondary | ICD-10-CM | POA: Diagnosis not present

## 2014-11-12 DIAGNOSIS — L409 Psoriasis, unspecified: Secondary | ICD-10-CM | POA: Diagnosis not present

## 2014-11-12 DIAGNOSIS — E119 Type 2 diabetes mellitus without complications: Secondary | ICD-10-CM | POA: Diagnosis not present

## 2014-12-08 ENCOUNTER — Emergency Department: Payer: Medicare Other

## 2014-12-08 ENCOUNTER — Encounter: Payer: Self-pay | Admitting: Emergency Medicine

## 2014-12-08 ENCOUNTER — Inpatient Hospital Stay (HOSPITAL_COMMUNITY)
Admit: 2014-12-08 | Discharge: 2014-12-08 | Disposition: A | Discharge status: Home / Self Care | Payer: Medicare Other | Attending: Cardiovascular Disease | Admitting: Cardiovascular Disease

## 2014-12-08 ENCOUNTER — Inpatient Hospital Stay
Admission: EM | Admit: 2014-12-08 | Discharge: 2014-12-18 | DRG: 682 | Disposition: A | Discharge status: Hospice | Payer: Medicare Other | Attending: Internal Medicine | Admitting: Internal Medicine

## 2014-12-08 DIAGNOSIS — I340 Nonrheumatic mitral (valve) insufficiency: Secondary | ICD-10-CM

## 2014-12-08 DIAGNOSIS — J440 Chronic obstructive pulmonary disease with acute lower respiratory infection: Secondary | ICD-10-CM | POA: Diagnosis not present

## 2014-12-08 DIAGNOSIS — R944 Abnormal results of kidney function studies: Secondary | ICD-10-CM | POA: Diagnosis not present

## 2014-12-08 DIAGNOSIS — Z6834 Body mass index (BMI) 34.0-34.9, adult: Secondary | ICD-10-CM

## 2014-12-08 DIAGNOSIS — R0902 Hypoxemia: Secondary | ICD-10-CM | POA: Diagnosis not present

## 2014-12-08 DIAGNOSIS — N183 Chronic kidney disease, stage 3 (moderate): Secondary | ICD-10-CM | POA: Diagnosis not present

## 2014-12-08 DIAGNOSIS — E1122 Type 2 diabetes mellitus with diabetic chronic kidney disease: Secondary | ICD-10-CM | POA: Diagnosis present

## 2014-12-08 DIAGNOSIS — Z794 Long term (current) use of insulin: Secondary | ICD-10-CM

## 2014-12-08 DIAGNOSIS — J441 Chronic obstructive pulmonary disease with (acute) exacerbation: Secondary | ICD-10-CM | POA: Diagnosis present

## 2014-12-08 DIAGNOSIS — Z9889 Other specified postprocedural states: Secondary | ICD-10-CM

## 2014-12-08 DIAGNOSIS — J9811 Atelectasis: Secondary | ICD-10-CM | POA: Diagnosis not present

## 2014-12-08 DIAGNOSIS — D631 Anemia in chronic kidney disease: Secondary | ICD-10-CM | POA: Diagnosis present

## 2014-12-08 DIAGNOSIS — C50919 Malignant neoplasm of unspecified site of unspecified female breast: Secondary | ICD-10-CM | POA: Diagnosis present

## 2014-12-08 DIAGNOSIS — R0602 Shortness of breath: Secondary | ICD-10-CM | POA: Diagnosis not present

## 2014-12-08 DIAGNOSIS — R410 Disorientation, unspecified: Secondary | ICD-10-CM

## 2014-12-08 DIAGNOSIS — J90 Pleural effusion, not elsewhere classified: Secondary | ICD-10-CM | POA: Diagnosis not present

## 2014-12-08 DIAGNOSIS — R0600 Dyspnea, unspecified: Secondary | ICD-10-CM

## 2014-12-08 DIAGNOSIS — Z853 Personal history of malignant neoplasm of breast: Secondary | ICD-10-CM | POA: Diagnosis not present

## 2014-12-08 DIAGNOSIS — F0390 Unspecified dementia without behavioral disturbance: Secondary | ICD-10-CM | POA: Diagnosis present

## 2014-12-08 DIAGNOSIS — J810 Acute pulmonary edema: Secondary | ICD-10-CM

## 2014-12-08 DIAGNOSIS — M1990 Unspecified osteoarthritis, unspecified site: Secondary | ICD-10-CM | POA: Diagnosis present

## 2014-12-08 DIAGNOSIS — G9341 Metabolic encephalopathy: Secondary | ICD-10-CM | POA: Diagnosis not present

## 2014-12-08 DIAGNOSIS — G92 Toxic encephalopathy: Secondary | ICD-10-CM | POA: Diagnosis present

## 2014-12-08 DIAGNOSIS — N179 Acute kidney failure, unspecified: Principal | ICD-10-CM | POA: Diagnosis present

## 2014-12-08 DIAGNOSIS — I4891 Unspecified atrial fibrillation: Secondary | ICD-10-CM | POA: Diagnosis not present

## 2014-12-08 DIAGNOSIS — D71 Functional disorders of polymorphonuclear neutrophils: Secondary | ICD-10-CM | POA: Diagnosis present

## 2014-12-08 DIAGNOSIS — Z6832 Body mass index (BMI) 32.0-32.9, adult: Secondary | ICD-10-CM | POA: Diagnosis not present

## 2014-12-08 DIAGNOSIS — I129 Hypertensive chronic kidney disease with stage 1 through stage 4 chronic kidney disease, or unspecified chronic kidney disease: Secondary | ICD-10-CM | POA: Diagnosis present

## 2014-12-08 DIAGNOSIS — R32 Unspecified urinary incontinence: Secondary | ICD-10-CM | POA: Diagnosis present

## 2014-12-08 DIAGNOSIS — K219 Gastro-esophageal reflux disease without esophagitis: Secondary | ICD-10-CM | POA: Diagnosis present

## 2014-12-08 DIAGNOSIS — N184 Chronic kidney disease, stage 4 (severe): Secondary | ICD-10-CM | POA: Diagnosis present

## 2014-12-08 DIAGNOSIS — E039 Hypothyroidism, unspecified: Secondary | ICD-10-CM | POA: Diagnosis present

## 2014-12-08 DIAGNOSIS — C3490 Malignant neoplasm of unspecified part of unspecified bronchus or lung: Secondary | ICD-10-CM | POA: Diagnosis present

## 2014-12-08 DIAGNOSIS — N19 Unspecified kidney failure: Secondary | ICD-10-CM | POA: Diagnosis not present

## 2014-12-08 DIAGNOSIS — E440 Moderate protein-calorie malnutrition: Secondary | ICD-10-CM | POA: Diagnosis present

## 2014-12-08 DIAGNOSIS — Z7982 Long term (current) use of aspirin: Secondary | ICD-10-CM | POA: Diagnosis not present

## 2014-12-08 DIAGNOSIS — I481 Persistent atrial fibrillation: Secondary | ICD-10-CM | POA: Diagnosis not present

## 2014-12-08 DIAGNOSIS — R4182 Altered mental status, unspecified: Secondary | ICD-10-CM | POA: Diagnosis not present

## 2014-12-08 DIAGNOSIS — I5031 Acute diastolic (congestive) heart failure: Secondary | ICD-10-CM | POA: Diagnosis not present

## 2014-12-08 DIAGNOSIS — M6281 Muscle weakness (generalized): Secondary | ICD-10-CM | POA: Diagnosis not present

## 2014-12-08 DIAGNOSIS — I482 Chronic atrial fibrillation: Secondary | ICD-10-CM | POA: Diagnosis not present

## 2014-12-08 DIAGNOSIS — R911 Solitary pulmonary nodule: Secondary | ICD-10-CM | POA: Diagnosis present

## 2014-12-08 DIAGNOSIS — Z66 Do not resuscitate: Secondary | ICD-10-CM | POA: Diagnosis present

## 2014-12-08 DIAGNOSIS — I272 Other secondary pulmonary hypertension: Secondary | ICD-10-CM | POA: Diagnosis not present

## 2014-12-08 DIAGNOSIS — I890 Lymphedema, not elsewhere classified: Secondary | ICD-10-CM | POA: Diagnosis present

## 2014-12-08 DIAGNOSIS — D649 Anemia, unspecified: Secondary | ICD-10-CM | POA: Diagnosis present

## 2014-12-08 DIAGNOSIS — I509 Heart failure, unspecified: Secondary | ICD-10-CM

## 2014-12-08 DIAGNOSIS — N189 Chronic kidney disease, unspecified: Secondary | ICD-10-CM

## 2014-12-08 DIAGNOSIS — J9601 Acute respiratory failure with hypoxia: Secondary | ICD-10-CM | POA: Diagnosis present

## 2014-12-08 DIAGNOSIS — Z79899 Other long term (current) drug therapy: Secondary | ICD-10-CM | POA: Diagnosis not present

## 2014-12-08 DIAGNOSIS — I5043 Acute on chronic combined systolic (congestive) and diastolic (congestive) heart failure: Secondary | ICD-10-CM

## 2014-12-08 DIAGNOSIS — F329 Major depressive disorder, single episode, unspecified: Secondary | ICD-10-CM | POA: Diagnosis present

## 2014-12-08 DIAGNOSIS — Z515 Encounter for palliative care: Secondary | ICD-10-CM | POA: Diagnosis present

## 2014-12-08 DIAGNOSIS — Z9011 Acquired absence of right breast and nipple: Secondary | ICD-10-CM | POA: Diagnosis not present

## 2014-12-08 DIAGNOSIS — J209 Acute bronchitis, unspecified: Secondary | ICD-10-CM | POA: Diagnosis present

## 2014-12-08 DIAGNOSIS — I4819 Other persistent atrial fibrillation: Secondary | ICD-10-CM

## 2014-12-08 DIAGNOSIS — R062 Wheezing: Secondary | ICD-10-CM

## 2014-12-08 HISTORY — DX: Type 2 diabetes mellitus without complications: E11.9

## 2014-12-08 HISTORY — DX: Atopic dermatitis, unspecified: L20.9

## 2014-12-08 HISTORY — DX: Essential (primary) hypertension: I10

## 2014-12-08 HISTORY — DX: Unspecified osteoarthritis, unspecified site: M19.90

## 2014-12-08 HISTORY — DX: Disorder of kidney and ureter, unspecified: N28.9

## 2014-12-08 HISTORY — DX: Unspecified dementia, unspecified severity, without behavioral disturbance, psychotic disturbance, mood disturbance, and anxiety: F03.90

## 2014-12-08 HISTORY — DX: Major depressive disorder, single episode, unspecified: F32.9

## 2014-12-08 HISTORY — DX: Disorder of thyroid, unspecified: E07.9

## 2014-12-08 HISTORY — DX: Depression, unspecified: F32.A

## 2014-12-08 LAB — CBC
HEMATOCRIT: 28.6 % — AB (ref 35.0–47.0)
Hemoglobin: 9.5 g/dL — ABNORMAL LOW (ref 12.0–16.0)
MCH: 33.2 pg (ref 26.0–34.0)
MCHC: 33.3 g/dL (ref 32.0–36.0)
MCV: 99.6 fL (ref 80.0–100.0)
PLATELETS: 194 10*3/uL (ref 150–440)
RBC: 2.87 MIL/uL — AB (ref 3.80–5.20)
RDW: 15 % — ABNORMAL HIGH (ref 11.5–14.5)
WBC: 5.1 10*3/uL (ref 3.6–11.0)

## 2014-12-08 LAB — BASIC METABOLIC PANEL
Anion gap: 4 — ABNORMAL LOW (ref 5–15)
BUN: 40 mg/dL — AB (ref 6–20)
CHLORIDE: 105 mmol/L (ref 101–111)
CO2: 23 mmol/L (ref 22–32)
CREATININE: 2.64 mg/dL — AB (ref 0.44–1.00)
Calcium: 7.6 mg/dL — ABNORMAL LOW (ref 8.9–10.3)
GFR calc non Af Amer: 15 mL/min — ABNORMAL LOW (ref >60)
GFR, EST AFRICAN AMERICAN: 17 mL/min — AB (ref >60)
Glucose, Bld: 186 mg/dL — ABNORMAL HIGH (ref 65–99)
Potassium: 5 mmol/L (ref 3.5–5.1)
Sodium: 132 mmol/L — ABNORMAL LOW (ref 135–145)

## 2014-12-08 LAB — CBC WITH DIFFERENTIAL/PLATELET
BASOS ABS: 0 10*3/uL (ref 0–0.1)
Basophils Relative: 1 %
Eosinophils Absolute: 0 10*3/uL (ref 0–0.7)
Eosinophils Relative: 1 %
HEMATOCRIT: 28.2 % — AB (ref 35.0–47.0)
HEMOGLOBIN: 9.1 g/dL — AB (ref 12.0–16.0)
LYMPHS ABS: 0.9 10*3/uL — AB (ref 1.0–3.6)
Lymphocytes Relative: 18 %
MCH: 32.1 pg (ref 26.0–34.0)
MCHC: 32.2 g/dL (ref 32.0–36.0)
MCV: 99.8 fL (ref 80.0–100.0)
Monocytes Absolute: 0.5 10*3/uL (ref 0.2–0.9)
Monocytes Relative: 9 %
NEUTROS ABS: 3.6 10*3/uL (ref 1.4–6.5)
Neutrophils Relative %: 71 %
Platelets: 193 10*3/uL (ref 150–440)
RBC: 2.83 MIL/uL — ABNORMAL LOW (ref 3.80–5.20)
RDW: 15.4 % — ABNORMAL HIGH (ref 11.5–14.5)
WBC: 5.1 10*3/uL (ref 3.6–11.0)

## 2014-12-08 LAB — CREATININE, SERUM
Creatinine, Ser: 2.71 mg/dL — ABNORMAL HIGH (ref 0.44–1.00)
GFR calc non Af Amer: 14 mL/min — ABNORMAL LOW (ref >60)
GFR, EST AFRICAN AMERICAN: 16 mL/min — AB (ref >60)

## 2014-12-08 LAB — GLUCOSE, CAPILLARY: Glucose-Capillary: 66 mg/dL (ref 65–99)

## 2014-12-08 LAB — BRAIN NATRIURETIC PEPTIDE: B Natriuretic Peptide: 972 pg/mL — ABNORMAL HIGH (ref 0.0–100.0)

## 2014-12-08 LAB — TROPONIN I: Troponin I: 0.03 ng/mL (ref <0.031)

## 2014-12-08 LAB — TSH: TSH: 12.422 u[IU]/mL — ABNORMAL HIGH (ref 0.350–4.500)

## 2014-12-08 MED ORDER — INSULIN ASPART 100 UNIT/ML ~~LOC~~ SOLN
0.0000 [IU] | Freq: Three times a day (TID) | SUBCUTANEOUS | Status: DC
  Administered 2014-12-09 (×2): 1 [IU] via SUBCUTANEOUS
  Administered 2014-12-10: 2 [IU] via SUBCUTANEOUS
  Administered 2014-12-11: 5 [IU] via SUBCUTANEOUS
  Administered 2014-12-12: 2 [IU] via SUBCUTANEOUS
  Administered 2014-12-12: 1 [IU] via SUBCUTANEOUS
  Administered 2014-12-12: 3 [IU] via SUBCUTANEOUS
  Administered 2014-12-13: 1 [IU] via SUBCUTANEOUS
  Administered 2014-12-13: 5 [IU] via SUBCUTANEOUS
  Administered 2014-12-13: 2 [IU] via SUBCUTANEOUS
  Administered 2014-12-14: 7 [IU] via SUBCUTANEOUS
  Administered 2014-12-14: 3 [IU] via SUBCUTANEOUS
  Administered 2014-12-14: 2 [IU] via SUBCUTANEOUS
  Administered 2014-12-15: 5 [IU] via SUBCUTANEOUS
  Administered 2014-12-15: 7 [IU] via SUBCUTANEOUS
  Administered 2014-12-15: 3 [IU] via SUBCUTANEOUS
  Administered 2014-12-16 (×2): 7 [IU] via SUBCUTANEOUS
  Administered 2014-12-16 – 2014-12-17 (×2): 5 [IU] via SUBCUTANEOUS
  Administered 2014-12-17 (×2): 3 [IU] via SUBCUTANEOUS
  Administered 2014-12-18: 5 [IU] via SUBCUTANEOUS
  Administered 2014-12-18: 3 [IU] via SUBCUTANEOUS
  Administered 2014-12-18: 5 [IU] via SUBCUTANEOUS
  Filled 2014-12-08 (×2): qty 2
  Filled 2014-12-08: qty 5
  Filled 2014-12-08: qty 1
  Filled 2014-12-08: qty 2
  Filled 2014-12-08 (×3): qty 3
  Filled 2014-12-08: qty 1
  Filled 2014-12-08: qty 7
  Filled 2014-12-08 (×2): qty 5
  Filled 2014-12-08: qty 2
  Filled 2014-12-08 (×2): qty 5
  Filled 2014-12-08: qty 1
  Filled 2014-12-08: qty 3
  Filled 2014-12-08: qty 1
  Filled 2014-12-08: qty 3
  Filled 2014-12-08: qty 1
  Filled 2014-12-08 (×2): qty 7
  Filled 2014-12-08: qty 5
  Filled 2014-12-08: qty 3
  Filled 2014-12-08: qty 7
  Filled 2014-12-08: qty 5

## 2014-12-08 MED ORDER — PANTOPRAZOLE SODIUM 40 MG PO TBEC
40.0000 mg | DELAYED_RELEASE_TABLET | Freq: Every day | ORAL | Status: DC
  Administered 2014-12-08 – 2014-12-18 (×11): 40 mg via ORAL
  Filled 2014-12-08 (×11): qty 1

## 2014-12-08 MED ORDER — VITAMIN D (ERGOCALCIFEROL) 1.25 MG (50000 UNIT) PO CAPS
50000.0000 [IU] | ORAL_CAPSULE | ORAL | Status: DC
  Administered 2014-12-15: 50000 [IU] via ORAL
  Filled 2014-12-08 (×2): qty 1

## 2014-12-08 MED ORDER — FLUOXETINE HCL 10 MG PO CAPS
10.0000 mg | ORAL_CAPSULE | Freq: Every day | ORAL | Status: DC
  Administered 2014-12-09 – 2014-12-18 (×10): 10 mg via ORAL
  Filled 2014-12-08 (×11): qty 1

## 2014-12-08 MED ORDER — ASPIRIN EC 81 MG PO TBEC
81.0000 mg | DELAYED_RELEASE_TABLET | Freq: Every day | ORAL | Status: DC
  Administered 2014-12-08 – 2014-12-18 (×11): 81 mg via ORAL
  Filled 2014-12-08 (×11): qty 1

## 2014-12-08 MED ORDER — GLIPIZIDE 10 MG PO TABS
5.0000 mg | ORAL_TABLET | Freq: Two times a day (BID) | ORAL | Status: DC
  Administered 2014-12-09: 5 mg via ORAL
  Filled 2014-12-08: qty 1

## 2014-12-08 MED ORDER — HEPARIN SODIUM (PORCINE) 5000 UNIT/ML IJ SOLN
5000.0000 [IU] | Freq: Three times a day (TID) | INTRAMUSCULAR | Status: DC
  Administered 2014-12-09 – 2014-12-17 (×21): 5000 [IU] via SUBCUTANEOUS
  Filled 2014-12-08 (×22): qty 1

## 2014-12-08 MED ORDER — FUROSEMIDE 10 MG/ML IJ SOLN
20.0000 mg | Freq: Two times a day (BID) | INTRAMUSCULAR | Status: DC
  Filled 2014-12-08: qty 2

## 2014-12-08 MED ORDER — ADULT MULTIVITAMIN W/MINERALS CH
1.0000 | ORAL_TABLET | Freq: Every day | ORAL | Status: DC
  Administered 2014-12-09 – 2014-12-18 (×10): 1 via ORAL
  Filled 2014-12-08 (×11): qty 1

## 2014-12-08 MED ORDER — SODIUM CHLORIDE 0.9 % IJ SOLN
3.0000 mL | Freq: Two times a day (BID) | INTRAMUSCULAR | Status: DC
  Administered 2014-12-08 – 2014-12-18 (×13): 3 mL via INTRAVENOUS

## 2014-12-08 MED ORDER — VITAMINS A & D EX OINT
1.0000 "application " | TOPICAL_OINTMENT | CUTANEOUS | Status: DC
  Administered 2014-12-16: 1 via TOPICAL
  Filled 2014-12-08 (×2): qty 5

## 2014-12-08 MED ORDER — ACETAMINOPHEN 325 MG PO TABS: 650.0000 mg | ORAL_TABLET | ORAL | Status: DC | PRN

## 2014-12-08 MED ORDER — ONDANSETRON HCL 4 MG/2ML IJ SOLN: 4.0000 mg | Freq: Four times a day (QID) | INTRAMUSCULAR | Status: DC | PRN

## 2014-12-08 MED ORDER — CETIRIZINE HCL 10 MG PO TABS
10.0000 mg | ORAL_TABLET | Freq: Every day | ORAL | Status: DC
  Administered 2014-12-08 – 2014-12-18 (×11): 10 mg via ORAL
  Filled 2014-12-08 (×11): qty 1

## 2014-12-08 MED ORDER — CARVEDILOL 3.125 MG PO TABS
3.1250 mg | ORAL_TABLET | Freq: Two times a day (BID) | ORAL | Status: DC
  Administered 2014-12-08: 3.125 mg via ORAL
  Filled 2014-12-08: qty 1

## 2014-12-08 MED ORDER — AMLODIPINE BESYLATE 5 MG PO TABS
5.0000 mg | ORAL_TABLET | Freq: Every day | ORAL | Status: DC
  Filled 2014-12-08: qty 1

## 2014-12-08 MED ORDER — FUROSEMIDE 10 MG/ML IJ SOLN
40.0000 mg | Freq: Once | INTRAMUSCULAR | Status: AC
  Administered 2014-12-08: 40 mg via INTRAVENOUS
  Filled 2014-12-08: qty 4

## 2014-12-08 MED ORDER — SODIUM CHLORIDE 0.9 % IJ SOLN
3.0000 mL | INTRAMUSCULAR | Status: DC | PRN
  Administered 2014-12-15: 3 mL via INTRAVENOUS
  Filled 2014-12-08: qty 10

## 2014-12-08 MED ORDER — OLOPATADINE HCL 0.1 % OP SOLN
1.0000 [drp] | Freq: Every day | OPHTHALMIC | Status: DC
  Administered 2014-12-09 – 2014-12-18 (×10): 1 [drp] via OPHTHALMIC
  Filled 2014-12-08 (×2): qty 5

## 2014-12-08 MED ORDER — SODIUM BICARBONATE 650 MG PO TABS
650.0000 mg | ORAL_TABLET | Freq: Two times a day (BID) | ORAL | Status: DC
  Administered 2014-12-08 – 2014-12-18 (×19): 650 mg via ORAL
  Filled 2014-12-08 (×19): qty 1

## 2014-12-08 MED ORDER — SODIUM CHLORIDE 0.9 % IV SOLN: 250.0000 mL | INTRAVENOUS | Status: DC | PRN

## 2014-12-08 MED ORDER — OXYBUTYNIN CHLORIDE ER 5 MG PO TB24
10.0000 mg | ORAL_TABLET | Freq: Every day | ORAL | Status: DC
  Administered 2014-12-09 – 2014-12-18 (×10): 10 mg via ORAL
  Filled 2014-12-08 (×12): qty 2

## 2014-12-08 MED ORDER — INSULIN ASPART 100 UNIT/ML ~~LOC~~ SOLN
0.0000 [IU] | Freq: Every day | SUBCUTANEOUS | Status: DC
  Administered 2014-12-11 – 2014-12-14 (×3): 3 [IU] via SUBCUTANEOUS
  Administered 2014-12-15: 5 [IU] via SUBCUTANEOUS
  Administered 2014-12-16: 3 [IU] via SUBCUTANEOUS
  Administered 2014-12-17: 2 [IU] via SUBCUTANEOUS
  Filled 2014-12-08: qty 3
  Filled 2014-12-08: qty 2
  Filled 2014-12-08: qty 3
  Filled 2014-12-08: qty 5
  Filled 2014-12-08: qty 3
  Filled 2014-12-08: qty 2

## 2014-12-08 MED ORDER — LEVOTHYROXINE SODIUM 100 MCG PO TABS
100.0000 ug | ORAL_TABLET | Freq: Every day | ORAL | Status: DC
  Administered 2014-12-09: 100 ug via ORAL
  Filled 2014-12-08: qty 1

## 2014-12-08 MED ORDER — LORAZEPAM 0.5 MG PO TABS
0.5000 mg | ORAL_TABLET | Freq: Two times a day (BID) | ORAL | Status: DC | PRN
  Administered 2014-12-10: 0.5 mg via ORAL
  Filled 2014-12-08: qty 1

## 2014-12-08 NOTE — Consult Note (Signed)
Cardiology Consultation Note  Patient ID: Paige James, MRN: [id number], DOB/AGE: [birth year] 81 y.o. Admit date: 12/08/2014   Date of Consult: 12/08/2014 Primary Physician: No primary care provider on file. Primary Cardiologist: No cardiologist  Chief Complaint: Shortness of breath, weakness Reason for Consult: New onset atrial fibrillation, CHF  HPI: 81 y.o. female with a known history of Metastatic breast cancer status post right-sided mastectomy with chemotherapy and radiation, currently negative for malignancy, history of diabetes, hypertension, hypothyroidism who lives at [facility] assisted [facility]. She presents today with 3-4 days of worsening shortness of breath, weakness, giving out. Found to be in atrial fibrillation in the emergency room.  daughter called EMS for worsening shortness of breath on exertion, unable to ambulate as she did in the past. She just gives out with several steps. Mild cough, nothing significant.  Family does report chronic shortness of breath, worse in the past several days. Possibly some mild leg edema  On EMS arrival, per report, EMS noted wheezes and O2 sats on RA were 83%.   Pt arrived to ED with 4L n/c with adequate O2 sats.   She has been requiring 2 pillows to lay downand being comfortable. She denies any fever. Family was unaware she was in atrial fibrillation. He denies smoking history, no prior heart attack  Past Medical History  Diagnosis Date  . Renal disorder   . Diabetes mellitus without complication ([place])   . Thyroid disease   . Senile dementia   . Hypertension   . Atopic dermatitis   . Osteoarthritis   . Depressive disorder       Most Recent Cardiac Studies: Echocardiogram pending    Surgical History: History reviewed. No pertinent past surgical history.   Home Meds: Prior to Admission medications   Medication Sig Start Date End Date Taking? Authorizing Provider  amLODipine (NORVASC) 5 MG tablet Take 5 mg by  mouth daily.   Yes Historical Provider, MD  carvedilol (COREG) 3.125 MG tablet Take 3.125 mg by mouth 2 (two) times daily.   Yes Historical Provider, MD  FLUoxetine (PROZAC) 10 MG capsule Take 10 mg by mouth daily.   Yes Historical Provider, MD  glipiZIDE (GLUCOTROL) 5 MG tablet Take 5 mg by mouth 2 (two) times daily with a meal.   Yes Historical Provider, MD  levocetirizine (XYZAL) 5 MG tablet Take 5 mg by mouth daily.   Yes Historical Provider, MD  levothyroxine (SYNTHROID, LEVOTHROID) 100 MCG tablet Take 100 mcg by mouth daily before breakfast.   Yes Historical Provider, MD  LORazepam (ATIVAN) 0.5 MG tablet Take 0.5 mg by mouth 2 (two) times daily as needed (for agitation).   Yes Historical Provider, MD  Multiple Vitamin (MULTIVITAMIN WITH MINERALS) TABS tablet Take 1 tablet by mouth daily.   Yes Historical Provider, MD  Olopatadine HCl (PAZEO) 0.7 % SOLN Apply 1 drop to eye daily.   Yes Historical Provider, MD  oxybutynin (DITROPAN-XL) 10 MG 24 hr tablet Take 10 mg by mouth daily.   Yes Historical Provider, MD  pantoprazole (PROTONIX) 40 MG tablet Take 40 mg by mouth daily.   Yes Historical Provider, MD  sodium bicarbonate 650 MG tablet Take 650 mg by mouth 2 (two) times daily.   Yes Historical Provider, MD  Vitamin D, Ergocalciferol, (DRISDOL) 50000 UNITS CAPS capsule Take 50,000 Units by mouth every 7 (seven) days. Pt takes on Wednesday.   Yes Historical Provider, MD  Vitamins A & D (VITAMIN A & D) ointment Apply 1 application  topically every Monday, Wednesday, and Friday.   Yes Historical Provider, MD    Inpatient Medications:  . furosemide  40 mg Intravenous Once  . insulin aspart  0-5 Units Subcutaneous QHS  . insulin aspart  0-9 Units Subcutaneous TID WC      Allergies: No Known Allergies  Social History   Social History  . Marital Status: Widowed    Spouse Name: N/A  . Number of Children: N/A  . Years of Education: N/A   Occupational History  . Not on file.   Social  History Main Topics  . Smoking status: Never Smoker   . Smokeless tobacco: Not on file  . Alcohol Use: Not on file  . Drug Use: Not on file  . Sexual Activity: Not on file   Other Topics Concern  . Not on file   Social History Narrative  . No narrative on file     No family history on file.   Review of Systems :Review of Systems  Constitutional: Positive for malaise/fatigue.  Respiratory: Positive for cough, shortness of breath and wheezing.   Cardiovascular: Positive for leg swelling.  Gastrointestinal: Negative.   Musculoskeletal: Negative.   Neurological: Positive for weakness.  Psychiatric/Behavioral: Negative.   All other systems reviewed and are negative.   Labs:  Recent Labs  12/08/14 1319  TROPONINI 0.03   Lab Results  Component Value Date   WBC 5.1 12/08/2014   HGB 9.1* 12/08/2014   HCT 28.2* 12/08/2014   MCV 99.8 12/08/2014   PLT 193 12/08/2014    Recent Labs Lab 12/08/14 1319  NA 132*  K 5.0  CL 105  CO2 23  BUN 40*  CREATININE 2.64*  CALCIUM 7.6*  GLUCOSE 186*   Lab Results  Component Value Date   CHOL 162 06/09/2011   HDL 38* 06/09/2011   LDLCALC 85 06/09/2011   TRIG 194 06/09/2011   No results found for: DDIMER  Radiology/Studies:  Dg Chest 2 View  12/08/2014   CLINICAL DATA:  81 year old with acute confusion superimposed upon baseline dementia. Wheezing and hypoxemia identified upon EMS arrival. Personal history of breast cancer post right mastectomy.  EXAM: CHEST  2 VIEW  COMPARISON:  CT chest 06/03/2013. Chest x-rays 04/06/2013 and earlier.  FINDINGS: Cardiac silhouette mildly enlarged for technique, unchanged. Mild-to-moderate diffuse interstitial pulmonary edema. Bilateral pleural effusions and associated mild consolidation in the lower lobes.  IMPRESSION: Mild to moderate acute CHF, with stable mild cardiomegaly and mild to moderate diffuse interstitial pulmonary edema. Bilateral pleural effusions and associated passive  atelectasis in the lower lobes.   Electronically Signed   By: Evangeline Dakin M.D.   On: 12/08/2014 13:37    EKG: Atrial fibrillation with rate in the 80s to 90s  Weights: There were no vitals filed for this visit.   Physical Exam: Telemetry showing atrial fibrillation, rate up to 100 bpm at rest Blood pressure 134/100, pulse 78, temperature 98.8 F (37.1 C), temperature source Oral, resp. rate 29, height '5\' 5"'$  (1.651 m), SpO2 98 %. There is no weight on file to calculate BMI. General: Well developed, well nourished, in no acute distress. Head: Normocephalic, atraumatic, sclera non-icteric, no xanthomas, nares are without discharge.  Neck: Negative for carotid bruits. JVD not elevated. Lungs: Wheezing, diffuse rails  Heart: IRRR  No murmurs, rubs, or gallops appreciated. Abdomen: Soft, non-tender, non-distended with active bowel sounds. No hepatomegaly. No rebound/guarding. No obvious abdominal masses. Msk:  Strength and tone appear normal for age. Extremities: No clubbing or  cyanosis. No edema.  Distal pedal pulses are 2+ and equal bilaterally. Neuro: Alert and oriented X 3. No facial asymmetry. No focal deficit. Moves all extremities spontaneously. Psych:  Responds to questions appropriately with a normal affect.    Assessment and Plan:  81 y.o. female with a history of Metastatic breast cancer status post right-sided mastectomy with chemotherapy and radiation, currently negative for malignancy, history of diabetes, hypertension, hypothyroidism who lives at [facility] assisted living home. She presents today with 3-4 days of worsening shortness of breath, weakness, giving out. Found to be in atrial fibrillation in the emergency room.  1) respiratory distress, hypoxia   secondary to CHF. No prior smoking history to explain her wheezing. Echocardiogram pending. Ejection fraction unclear at this time Chest x-ray confirming pulmonary edema, pleural effusions No known coronary artery  disease, cardiac enzymes negative Possibly exacerbated by new atrial fibrillation --She is scheduled to receive Lasix 40 mg IV in the emergency room   would continue Lasix 40 mg IV twice a day  2) atrial fibrillation Possibly persistent. Timing of onset is unclear. Unable to exclude onset of atrial fibrillation 4 days ago given symptoms got worse at that time. May have had chronic atrial fibrillation for long period time, CHF symptoms in the past several days.  --Not a good candidate for anticoagulation given her anemia, high fall risk. Family confirms she has had recent fall. Should be walking with a walker, only walks with a cane at the nursing home. --We'll start beta blocker for rate control, metoprolol 25 mg twice a day with slow titration upwards as tolerated. Echo pending  3) chronic renal failure, stage IV Worse than 2015, creatinine now 2.5 We'll need to proceed with Lasix cautiously She reports that she sees nephrology  4) anemia Likely contributing to her shortness of breath symptoms, tachycardia Unclear if this is iron deficiency, would run a iron panel A need to discuss with renal she is a candidate for EPO   5) Breast cancer, history of mastectomy   surgery on the right with lymphedema of the right arm Seen by oncology, by report is in remission  6) pleural effusions Seen on chest x-ray Secondary to CHF  Signed, Esmond Plants, MD [facility] 12/08/2014, 4:43 PM

## 2014-12-08 NOTE — H&P (Signed)
[facility] [place] NAME: Paige James    MR#:  [id number]  DATE OF BIRTH:  1920-12-27  DATE OF ADMISSION:  12/08/2014  PRIMARY CARE PHYSICIAN: Clayborn Bigness MD   REQUESTING/REFERRING PHYSICIAN: Nance Pear MD  CHIEF COMPLAINT:   Chief Complaint  Patient presents with  . Altered Mental Status  Hypoxia HISTORY OF PRESENT ILLNESS:  Paige James  is a 81 y.o. female with a known history of Metastatic breast cancer status post right-sided mastectomy with chemotherapy and radiation, currently negative for malignancy, history of diabetes, hypertension, hypothyroidism who lives at [facility]. Her daughter called EMS due to her concerns that she was talking about people who have been deceased over the phone with her today. Pt does have baseline dementia, but daughter noted her to be more altered than normal. On EMS arrival, per report, EMS noted wheezes and O2 sats on RA were 83%. Pt arrived to ED with 4L n/c with adequate O2 sats. Pt pleasant, alert and oriented to person and place. On further discussion.  Her daughter mentioned patient regarding cold for about 4 days.  She has also been having shortness of breath on and off for couple of weeks with her not able to walk from bed to the door without getting short of breath lately.  She has been requiring 2 pillows to lay downand being comfortable.  She denies any fever.  She was noted to be in new onset atrial fibrillation while in the emergency department. PAST MEDICAL HISTORY:   Past Medical History  Diagnosis Date  . Renal disorder   . Diabetes mellitus without complication ([place])   . Thyroid disease   . Senile dementia   . Hypertension   . Atopic dermatitis   . Osteoarthritis   . Depressive disorder    PAST SURGICAL HISTORY:  History reviewed. No pertinent past surgical history.  1.   Cataract surgery.   2.   Right-sided mastectomy.   3.   Right knee  surgery.   4.   Left knee total knee replacement.   5.   Hip surgery.   SOCIAL HISTORY:   Social History  Substance Use Topics  . Smoking status: Never Smoker   . Smokeless tobacco: Not on file  . Alcohol Use: Not on file  She lives it [facility] assisted [facility] FAMILY HISTORY:  No family history on file. one of her daughters had colon cancer, the other one had leukemia and brother with prostate cancer.   DRUG ALLERGIES:  No Known Allergies REVIEW OF SYSTEMS:   Review of Systems  Constitutional: Negative for fever, weight loss, malaise/fatigue and diaphoresis.  HENT: Negative for ear discharge, ear pain, hearing loss, nosebleeds, sore throat and tinnitus.   Eyes: Negative for blurred vision and pain.  Respiratory: Positive for shortness of breath and wheezing. Negative for cough and hemoptysis.   Cardiovascular: Positive for orthopnea and PND. Negative for chest pain, palpitations and leg swelling.  Gastrointestinal: Negative for heartburn, nausea, vomiting, abdominal pain, diarrhea, constipation and blood in stool.  Genitourinary: Negative for dysuria, urgency and frequency.  Musculoskeletal: Negative for myalgias and back pain.  Skin: Negative for itching and rash.  Neurological: Negative for dizziness, tingling, tremors, focal weakness, seizures, weakness and headaches.  Psychiatric/Behavioral: Positive for memory loss. Negative for depression. The patient is not nervous/anxious.    MEDICATIONS AT HOME:   Prior to Admission medications   Medication Sig Start Date End Date Taking? Authorizing  Provider  amLODipine (NORVASC) 5 MG tablet Take 5 mg by mouth daily.   Yes Historical Provider, MD  carvedilol (COREG) 3.125 MG tablet Take 3.125 mg by mouth 2 (two) times daily.   Yes Historical Provider, MD  FLUoxetine (PROZAC) 10 MG capsule Take 10 mg by mouth daily.   Yes Historical Provider, MD  glipiZIDE (GLUCOTROL) 5 MG tablet Take 5 mg by mouth 2 (two) times daily with a meal.    Yes Historical Provider, MD  levocetirizine (XYZAL) 5 MG tablet Take 5 mg by mouth daily.   Yes Historical Provider, MD  levothyroxine (SYNTHROID, LEVOTHROID) 100 MCG tablet Take 100 mcg by mouth daily before breakfast.   Yes Historical Provider, MD  LORazepam (ATIVAN) 0.5 MG tablet Take 0.5 mg by mouth 2 (two) times daily as needed (for agitation).   Yes Historical Provider, MD  Multiple Vitamin (MULTIVITAMIN WITH MINERALS) TABS tablet Take 1 tablet by mouth daily.   Yes Historical Provider, MD  Olopatadine HCl (PAZEO) 0.7 % SOLN Apply 1 drop to eye daily.   Yes Historical Provider, MD  oxybutynin (DITROPAN-XL) 10 MG 24 hr tablet Take 10 mg by mouth daily.   Yes Historical Provider, MD  pantoprazole (PROTONIX) 40 MG tablet Take 40 mg by mouth daily.   Yes Historical Provider, MD  sodium bicarbonate 650 MG tablet Take 650 mg by mouth 2 (two) times daily.   Yes Historical Provider, MD  Vitamin D, Ergocalciferol, (DRISDOL) 50000 UNITS CAPS capsule Take 50,000 Units by mouth every 7 (seven) days. Pt takes on Wednesday.   Yes Historical Provider, MD  Vitamins A & D (VITAMIN A & D) ointment Apply 1 application topically every Monday, Wednesday, and Friday.   Yes Historical Provider, MD      VITAL SIGNS:  Blood pressure 134/100, pulse 78, temperature 98.8 F (37.1 C), temperature source Oral, resp. rate 29, height '5\' 5"'$  (1.651 m), SpO2 98 %. on 4 L PHYSICAL EXAMINATION:  Physical Exam  Constitutional: She is oriented to person, place, and time and well-developed, well-nourished, and in no distress.  HENT:  Head: Normocephalic and atraumatic.  Eyes: Conjunctivae and EOM are normal. Pupils are equal, round, and reactive to light.  Neck: Normal range of motion. Neck supple. No tracheal deviation present. No thyromegaly present.  Cardiovascular: Normal rate, regular rhythm and normal heart sounds.   Pulmonary/Chest: Tachypnea noted. She is in respiratory distress. She has wheezes. She exhibits no  tenderness.  Right-sided mastectomy  Abdominal: Soft. Bowel sounds are normal. She exhibits no distension. There is no tenderness.  Musculoskeletal: Normal range of motion.  Neurological: She is alert and oriented to person, place, and time. No cranial nerve deficit.  Skin: Skin is warm and dry. No rash noted.  Psychiatric: Mood and affect normal.   LABORATORY PANEL:   CBC  Recent Labs Lab 12/08/14 1319  WBC 5.1  HGB 9.1*  HCT 28.2*  PLT 193   ------------------------------------------------------------------------------------------------------------------  Chemistries   Recent Labs Lab 12/08/14 1319  NA 132*  K 5.0  CL 105  CO2 23  GLUCOSE 186*  BUN 40*  CREATININE 2.64*  CALCIUM 7.6*   ------------------------------------------------------------------------------------------------------------------  Cardiac Enzymes  Recent Labs Lab 12/08/14 1319  TROPONINI 0.03   ------------------------------------------------------------------------------------------------------------------  RADIOLOGY:  Dg Chest 2 View  12/08/2014   CLINICAL DATA:  81 year old with acute confusion superimposed upon baseline dementia. Wheezing and hypoxemia identified upon EMS arrival. Personal history of breast cancer post right mastectomy.  EXAM: CHEST  2 VIEW  COMPARISON:  CT chest 06/03/2013. Chest x-rays 04/06/2013 and earlier.  FINDINGS: Cardiac silhouette mildly enlarged for technique, unchanged. Mild-to-moderate diffuse interstitial pulmonary edema. Bilateral pleural effusions and associated mild consolidation in the lower lobes.  IMPRESSION: Mild to moderate acute CHF, with stable mild cardiomegaly and mild to moderate diffuse interstitial pulmonary edema. Bilateral pleural effusions and associated passive atelectasis in the lower lobes.   Electronically Signed   By: Evangeline Dakin M.D.   On: 12/08/2014 13:37   IMPRESSION AND PLAN:  An 81 year old female with past medical history of  breast cancer status post right mastectomy, chemotherapy and radiation following with Dr. Oliva Bustard, history of diabetes, hypertension, sent in for confusion and Hypoxia    1.   Altered mental status, likely metabolic encephalopathy from acute renal failure and hypoxia, seems to be improving now and at baseline.  2.   Acute hypoxic respiratory failure: Could be due to new onset atrial fibrillation leading to CHF and/or underlying URI, will get 2-D echocardiogram.  Obtain cardiology consultation and gentle diuresis. Strict I's and O's and daily weights. Nebs. Consider steroids. 3.   Acute renal failure on CKD stage III: Consider nephrology consultation if worsens. Recheck in a.m. Baseline creatinine seems to be 1.8.   4.   New onset atrial fibrillation: Rate is well controlled at this time.  We will consult cardiology. 5.   Hypertension. Norvasc.   6.   Diabetes mellitus: monitor sugars and adjust dosage and diabetic nurse c/s. SSI 7.   Hypothyroidism. Continue Synthroid. Follow-up TSH.     Gastrointestinal and deep vein thrombosis prophylaxis. On Protonix and subcutaneous heparin.   Physical therapy evaluation and management to see if she would need stepup in her placement from assisted to a rehabilitation   All the records are reviewed and case discussed with ED provider. Management plans discussed with the patient, family and they are in agreement.  CODE STATUS: DO NOT RESUSCITATE  TOTAL TIME TAKING CARE OF THIS PATIENT: 55 minutes.    [facility], Migel Hannis M.D on 12/08/2014 at 2:50 PM  Between 7am to 6pm - Pager - [phone]  After 6pm go to [email or web address] - password [email or web address] [facility]  [facility]  Office  [phone]  CC: Primary care physician; Clayborn Bigness MD

## 2014-12-08 NOTE — ED Notes (Signed)
Pt with low blood glucose per hypoglycemic protocol.  Blood glucose = 66.  Pt given graham crackers and sandwich, which she is eating

## 2014-12-08 NOTE — Progress Notes (Signed)
*  PRELIMINARY RESULTS* Echocardiogram 2D Echocardiogram has been performed.  Paige James 12/08/2014, 6:19 PM

## 2014-12-08 NOTE — ED Provider Notes (Signed)
[facility] Emergency Department Provider Note    ____________________________________________  Time seen: 1255  I have reviewed the triage vital signs and the nursing notes.   HISTORY  Chief Complaint Cough  History limited by: Not Limited   HPI Paige James is a 81 y.o. female who presents from her living facility today because of concerns for cough. The patient states she has been coughing for a few days. She denies bringing up anything with her cough. She denies any associated chest pain. She does state she has had some worsening shortness of breath. No fevers. The patient was found to be hypoxic into the low 80s on room air by EMS. She was also found to be wheezing by EMS and received 1 DuoNeb.    Past Medical History  Diagnosis Date  . Renal disorder   . Diabetes mellitus without complication ([place])   . Thyroid disease   . Senile dementia   . Hypertension   . Atopic dermatitis   . Osteoarthritis   . Depressive disorder     Patient Active Problem List   Diagnosis Date Noted  . CHF (congestive heart failure) ([place]) 12/08/2014    History reviewed. No pertinent past surgical history.  Current Outpatient Rx  Name  Route  Sig  Dispense  Refill  . amLODipine (NORVASC) 5 MG tablet   Oral   Take 5 mg by mouth daily.         . carvedilol (COREG) 3.125 MG tablet   Oral   Take 3.125 mg by mouth 2 (two) times daily.         Marland Kitchen FLUoxetine (PROZAC) 10 MG capsule   Oral   Take 10 mg by mouth daily.         Marland Kitchen glipiZIDE (GLUCOTROL) 5 MG tablet   Oral   Take 5 mg by mouth 2 (two) times daily with a meal.         . levocetirizine (XYZAL) 5 MG tablet   Oral   Take 5 mg by mouth daily.         Marland Kitchen levothyroxine (SYNTHROID, LEVOTHROID) 100 MCG tablet   Oral   Take 100 mcg by mouth daily before breakfast.         . LORazepam (ATIVAN) 0.5 MG tablet   Oral   Take 0.5 mg by mouth 2 (two) times daily as needed (for agitation).         . Multiple Vitamin (MULTIVITAMIN WITH MINERALS) TABS tablet   Oral   Take 1 tablet by mouth daily.         . Olopatadine HCl (PAZEO) 0.7 % SOLN   Ophthalmic   Apply 1 drop to eye daily.         Marland Kitchen oxybutynin (DITROPAN-XL) 10 MG 24 hr tablet   Oral   Take 10 mg by mouth daily.         . pantoprazole (PROTONIX) 40 MG tablet   Oral   Take 40 mg by mouth daily.         . sodium bicarbonate 650 MG tablet   Oral   Take 650 mg by mouth 2 (two) times daily.         . Vitamin D, Ergocalciferol, (DRISDOL) 50000 UNITS CAPS capsule   Oral   Take 50,000 Units by mouth every 7 (seven) days. Pt takes on Wednesday.         . Vitamins A & D (VITAMIN A & D) ointment  Topical   Apply 1 application topically every Monday, Wednesday, and Friday.           Allergies Review of patient's allergies indicates no known allergies.  No family history on file.  Social History Social History  Substance Use Topics  . Smoking status: Never Smoker   . Smokeless tobacco: None  . Alcohol Use: None    Review of Systems  Constitutional: Negative for fever. Cardiovascular: Negative for chest pain. Respiratory: Positive for shortness of breath and cough. Gastrointestinal: Negative for abdominal pain, vomiting and diarrhea. Genitourinary: Negative for dysuria. Musculoskeletal: Negative for back pain. Skin: Negative for rash. Neurological: Negative for headaches, focal weakness or numbness.  10-point ROS otherwise negative.  ____________________________________________   PHYSICAL EXAM:  VITAL SIGNS:   98.8 F (37.1 C)  78  --   134/100 mmHg  98 %     Constitutional: Alert and oriented. Well appearing and in no distress. Eyes: Conjunctivae are normal. PERRL. Normal extraocular movements. ENT   Head: Normocephalic and atraumatic.   Nose: No congestion/rhinnorhea.   Mouth/Throat: Mucous membranes are moist.   Neck: No  stridor. Hematological/Lymphatic/Immunilogical: No cervical lymphadenopathy. Cardiovascular: Normal rate, regular rhythm.  No murmurs, rubs, or gallops. Respiratory: Normal respiratory effort without tachypnea nor retractions. Breath sounds are clear and equal bilaterally. No wheezes/rales/rhonchi. Gastrointestinal: Soft and nontender. No distention. There is no CVA tenderness. Genitourinary: Deferred Musculoskeletal: Normal range of motion in all extremities. No joint effusions.  No lower extremity tenderness nor edema. Neurologic:  Normal speech and language. No gross focal neurologic deficits are appreciated. Speech is normal.  Skin:  Skin is warm, dry and intact. No rash noted. Psychiatric: Mood and affect are normal. Speech and behavior are normal. Patient exhibits appropriate insight and judgment.  ____________________________________________    LABS (pertinent positives/negatives)  Labs Reviewed  CBC WITH DIFFERENTIAL/PLATELET - Abnormal; Notable for the following:    RBC 2.83 (*)    Hemoglobin 9.1 (*)    HCT 28.2 (*)    RDW 15.4 (*)    Lymphs Abs 0.9 (*)    All other components within normal limits  BASIC METABOLIC PANEL - Abnormal; Notable for the following:    Sodium 132 (*)    Glucose, Bld 186 (*)    BUN 40 (*)    Creatinine, Ser 2.64 (*)    Calcium 7.6 (*)    GFR calc non Af Amer 15 (*)    GFR calc Af Amer 17 (*)    Anion gap 4 (*)    All other components within normal limits  BRAIN NATRIURETIC PEPTIDE - Abnormal; Notable for the following:    B Natriuretic Peptide 972.0 (*)    All other components within normal limits  TROPONIN I  GLUCOSE, CAPILLARY  HEMOGLOBIN A1C     ____________________________________________   EKG  I, Nance Pear, attending physician, personally viewed and interpreted this EKG  EKG Time: 1259 Rate: 98 Rhythm: afib Axis: normal Intervals: qtc 538 QRS: RBBB ST changes: no st elevation Impression: abnormal  EKG ____________________________________________    RADIOLOGY  CXR IMPRESSION: Mild to moderate acute CHF, with stable mild cardiomegaly and mild to moderate diffuse interstitial pulmonary edema. Bilateral pleural effusions and associated passive atelectasis in the lower lobes.  I, Avonda Toso, personally viewed and evaluated these images (plain radiographs) as part of my medical decision making. ____________________________________________   PROCEDURES  Procedure(s) performed: None  Critical Care performed: No  ____________________________________________   INITIAL IMPRESSION / ASSESSMENT AND PLAN / ED  COURSE  Pertinent labs & imaging results that were available during my care of the patient were reviewed by me and considered in my medical decision making (see chart for details).  Patient presented to the emergency department today because of some confusion and shortness of breath. Workup is notable for elevated BNP, pulmonary edema. Because of this patient was written for Lasix. Will plan on admission to the hospitalist service. ____________________________________________   FINAL CLINICAL IMPRESSION(S) / ED DIAGNOSES  Final diagnoses:  Hypoxia  Acute pulmonary edema ([place])     Nance Pear, MD 12/08/14 2024

## 2014-12-08 NOTE — ED Notes (Signed)
Pt lives in assisted living home.  Her daughter called EMS due to her concerns that she was talking about people who have been deceased over the phone with her today.  Pt does have baseline dementia, but daughter noted her to be more altered than normal.  On EMS arrival, per report, EMS noted wheezes and O2 sats on RA were 83%.  Pt arrived to ED with 4L n/c with adequate O2 sats.  Pt pleasant, alert and oriented to person and place.

## 2014-12-09 ENCOUNTER — Inpatient Hospital Stay: Payer: Medicare Other

## 2014-12-09 DIAGNOSIS — J810 Acute pulmonary edema: Secondary | ICD-10-CM

## 2014-12-09 DIAGNOSIS — R062 Wheezing: Secondary | ICD-10-CM

## 2014-12-09 DIAGNOSIS — I4891 Unspecified atrial fibrillation: Secondary | ICD-10-CM

## 2014-12-09 DIAGNOSIS — R0602 Shortness of breath: Secondary | ICD-10-CM

## 2014-12-09 DIAGNOSIS — I4819 Other persistent atrial fibrillation: Secondary | ICD-10-CM

## 2014-12-09 DIAGNOSIS — R0902 Hypoxemia: Secondary | ICD-10-CM

## 2014-12-09 DIAGNOSIS — I5043 Acute on chronic combined systolic (congestive) and diastolic (congestive) heart failure: Secondary | ICD-10-CM

## 2014-12-09 LAB — IRON AND TIBC
Iron: 44 ug/dL (ref 28–170)
SATURATION RATIOS: 22 % (ref 10.4–31.8)
TIBC: 203 ug/dL — ABNORMAL LOW (ref 250–450)
UIBC: 159 ug/dL

## 2014-12-09 LAB — BASIC METABOLIC PANEL
ANION GAP: 9 (ref 5–15)
BUN: 43 mg/dL — ABNORMAL HIGH (ref 6–20)
CHLORIDE: 107 mmol/L (ref 101–111)
CO2: 25 mmol/L (ref 22–32)
Calcium: 7.8 mg/dL — ABNORMAL LOW (ref 8.9–10.3)
Creatinine, Ser: 2.8 mg/dL — ABNORMAL HIGH (ref 0.44–1.00)
GFR calc Af Amer: 16 mL/min — ABNORMAL LOW (ref >60)
GFR, EST NON AFRICAN AMERICAN: 14 mL/min — AB (ref >60)
GLUCOSE: 124 mg/dL — AB (ref 65–99)
POTASSIUM: 4 mmol/L (ref 3.5–5.1)
Sodium: 141 mmol/L (ref 135–145)

## 2014-12-09 LAB — HEMOGLOBIN A1C: HEMOGLOBIN A1C: 6.8 % — AB (ref 4.0–6.0)

## 2014-12-09 LAB — CBC WITH DIFFERENTIAL/PLATELET
BASOS ABS: 0 10*3/uL (ref 0–0.1)
Basophils Relative: 1 %
Eosinophils Absolute: 0.1 10*3/uL (ref 0–0.7)
Eosinophils Relative: 3 %
HEMATOCRIT: 27 % — AB (ref 35.0–47.0)
Hemoglobin: 8.6 g/dL — ABNORMAL LOW (ref 12.0–16.0)
LYMPHS ABS: 1.3 10*3/uL (ref 1.0–3.6)
LYMPHS PCT: 27 %
MCH: 32 pg (ref 26.0–34.0)
MCHC: 31.9 g/dL — ABNORMAL LOW (ref 32.0–36.0)
MCV: 100.1 fL — AB (ref 80.0–100.0)
MONO ABS: 0.5 10*3/uL (ref 0.2–0.9)
MONOS PCT: 10 %
NEUTROS ABS: 2.8 10*3/uL (ref 1.4–6.5)
Neutrophils Relative %: 59 %
Platelets: 198 10*3/uL (ref 150–440)
RBC: 2.69 MIL/uL — ABNORMAL LOW (ref 3.80–5.20)
RDW: 15.1 % — AB (ref 11.5–14.5)
WBC: 4.7 10*3/uL (ref 3.6–11.0)

## 2014-12-09 LAB — FERRITIN: Ferritin: 202 ng/mL (ref 11–307)

## 2014-12-09 LAB — GLUCOSE, CAPILLARY
GLUCOSE-CAPILLARY: 105 mg/dL — AB (ref 65–99)
GLUCOSE-CAPILLARY: 106 mg/dL — AB (ref 65–99)
Glucose-Capillary: 125 mg/dL — ABNORMAL HIGH (ref 65–99)
Glucose-Capillary: 142 mg/dL — ABNORMAL HIGH (ref 65–99)
Glucose-Capillary: 166 mg/dL — ABNORMAL HIGH (ref 65–99)

## 2014-12-09 LAB — RETICULOCYTES
RBC.: 2.67 MIL/uL — ABNORMAL LOW (ref 3.80–5.20)
Retic Count, Absolute: 64.1 10*3/uL (ref 19.0–183.0)
Retic Ct Pct: 2.4 % (ref 0.4–3.1)

## 2014-12-09 LAB — FOLATE: Folate: 60.8 ng/mL (ref >5.9)

## 2014-12-09 LAB — VITAMIN B12: Vitamin B-12: 312 pg/mL (ref 180–914)

## 2014-12-09 MED ORDER — ALBUTEROL SULFATE (2.5 MG/3ML) 0.083% IN NEBU: 2.5000 mg | INHALATION_SOLUTION | Freq: Four times a day (QID) | RESPIRATORY_TRACT | Status: DC

## 2014-12-09 MED ORDER — AZITHROMYCIN 250 MG PO TABS
250.0000 mg | ORAL_TABLET | Freq: Every day | ORAL | Status: AC
  Administered 2014-12-10 – 2014-12-13 (×4): 250 mg via ORAL
  Filled 2014-12-09 (×4): qty 1

## 2014-12-09 MED ORDER — LEVALBUTEROL HCL 1.25 MG/0.5ML IN NEBU
1.2500 mg | INHALATION_SOLUTION | Freq: Four times a day (QID) | RESPIRATORY_TRACT | Status: DC
  Administered 2014-12-09 – 2014-12-10 (×4): 1.25 mg via RESPIRATORY_TRACT
  Filled 2014-12-09 (×4): qty 0.5

## 2014-12-09 MED ORDER — LEVOTHYROXINE SODIUM 125 MCG PO TABS
125.0000 ug | ORAL_TABLET | Freq: Every day | ORAL | Status: DC
  Administered 2014-12-10 – 2014-12-18 (×9): 125 ug via ORAL
  Filled 2014-12-09 (×10): qty 1

## 2014-12-09 MED ORDER — AZITHROMYCIN 250 MG PO TABS
500.0000 mg | ORAL_TABLET | Freq: Every day | ORAL | Status: AC
  Administered 2014-12-09: 500 mg via ORAL
  Filled 2014-12-09: qty 2

## 2014-12-09 MED ORDER — FUROSEMIDE 10 MG/ML IJ SOLN
40.0000 mg | Freq: Every day | INTRAMUSCULAR | Status: DC
Start: 2014-12-10 — End: 2014-12-10
  Filled 2014-12-09: qty 4

## 2014-12-09 MED ORDER — CARVEDILOL 6.25 MG PO TABS
6.2500 mg | ORAL_TABLET | Freq: Two times a day (BID) | ORAL | Status: DC
  Administered 2014-12-09 – 2014-12-18 (×18): 6.25 mg via ORAL
  Filled 2014-12-09 (×18): qty 1

## 2014-12-09 MED ORDER — FUROSEMIDE 10 MG/ML IJ SOLN: 40.0000 mg | Freq: Two times a day (BID) | INTRAMUSCULAR | Status: DC

## 2014-12-09 MED ORDER — FUROSEMIDE 10 MG/ML IJ SOLN
40.0000 mg | Freq: Two times a day (BID) | INTRAMUSCULAR | Status: DC
Start: 2014-12-09 — End: 2014-12-09
  Filled 2014-12-09: qty 4

## 2014-12-09 MED ORDER — DILTIAZEM HCL 30 MG PO TABS
30.0000 mg | ORAL_TABLET | Freq: Four times a day (QID) | ORAL | Status: DC
  Administered 2014-12-09 – 2014-12-10 (×4): 30 mg via ORAL
  Filled 2014-12-09 (×6): qty 1

## 2014-12-09 NOTE — Consult Note (Signed)
[facility] CONSULT NOTE    Date: 12/09/2014                  Patient Name:  Paige James  MRN: [id number]  DOB: 1920-11-10  Age / Sex: 81 y.o., female         PCP: No primary care provider on file.                 Service Requesting Consult: Dr. Jeni Salles Medicine                 Reason for Consult: Acute renal failure/CKD stage IV            History of Present Illness: Patient is a 81 y.o. female with a PMHx of chronic kidney disease stage IV baseline EGFR 29 and creatinine of 1.7, diabetes mellitus type 2, dementia, hypertension, osteophytes, depression, who was admitted to [facility] on 12/08/2014 for evaluation of altered mental status.  She has baseline dementia and is unable to offer accurate history.  The patient's daughter apparently called EMS as she was more confused than normal. Upon EMS arrival she was found to be hypoxic with sats of 83%.  We are asked to see her for evaluation management of acute renal failure in the setting of known chronic kidney disease stage IV. The patient's baseline creatinine is 1.7 with an EGFR of 29.  Echocardiogram was completed which showed an EF of 45-50%.  However there was pulmonary hypertension noted.  She is being administered IV diuretics.  Creatinine is currently up to 2.8.  Patient denies nausea, vomiting, diarrhea or any other source of volume loss.   Medications: Outpatient medications: Prescriptions prior to admission  Medication Sig Dispense Refill Last Dose  . amLODipine (NORVASC) 5 MG tablet Take 5 mg by mouth daily.   12/08/2014 at Unknown time  . carvedilol (COREG) 3.125 MG tablet Take 3.125 mg by mouth 2 (two) times daily.   12/08/2014 at 0800  . FLUoxetine (PROZAC) 10 MG capsule Take 10 mg by mouth daily.   12/08/2014 at Unknown time  . glipiZIDE (GLUCOTROL) 5 MG tablet Take 5 mg by mouth 2 (two) times daily with a meal.   12/08/2014 at Unknown time  . levocetirizine (XYZAL) 5 MG tablet Take 5 mg by  mouth daily.   12/08/2014 at Unknown time  . levothyroxine (SYNTHROID, LEVOTHROID) 100 MCG tablet Take 100 mcg by mouth daily before breakfast.   12/08/2014 at Unknown time  . LORazepam (ATIVAN) 0.5 MG tablet Take 0.5 mg by mouth 2 (two) times daily as needed (for agitation).   12/08/2014 at Unknown time  . Multiple Vitamin (MULTIVITAMIN WITH MINERALS) TABS tablet Take 1 tablet by mouth daily.   12/08/2014 at Unknown time  . Olopatadine HCl (PAZEO) 0.7 % SOLN Apply 1 drop to eye daily.   12/08/2014 at Unknown time  . oxybutynin (DITROPAN-XL) 10 MG 24 hr tablet Take 10 mg by mouth daily.   12/08/2014 at Unknown time  . pantoprazole (PROTONIX) 40 MG tablet Take 40 mg by mouth daily.   12/08/2014 at Unknown time  . sodium bicarbonate 650 MG tablet Take 650 mg by mouth 2 (two) times daily.   12/08/2014 at Unknown time  . Vitamin D, Ergocalciferol, (DRISDOL) 50000 UNITS CAPS capsule Take 50,000 Units by mouth every 7 (seven) days. Pt takes on Wednesday.   12/02/2014 at unknown  . Vitamins A & D (VITAMIN A & D) ointment Apply 1 application topically every Monday,  Wednesday, and Friday.   12/07/2014 at Unknown time    Current medications: Current Facility-Administered Medications  Medication Dose Route Frequency Provider Last Rate Last Dose  . 0.9 %  sodium chloride infusion  250 mL Intravenous PRN Max Sane, MD      . acetaminophen (TYLENOL) tablet 650 mg  650 mg Oral Q4H PRN Max Sane, MD      . aspirin EC tablet 81 mg  81 mg Oral Daily Vipul Shah, MD   81 mg at 12/09/14 1020  . [START ON 12/10/2014] azithromycin (ZITHROMAX) tablet 250 mg  250 mg Oral Daily Henreitta Leber, MD      . carvedilol (COREG) tablet 6.25 mg  6.25 mg Oral BID Minna Merritts, MD   6.25 mg at 12/09/14 1020  . cetirizine (ZYRTEC) tablet 10 mg  10 mg Oral Daily Max Sane, MD   10 mg at 12/09/14 1020  . diltiazem (CARDIZEM) tablet 30 mg  30 mg Oral 4 times per day Minna Merritts, MD   30 mg at 12/09/14 1242  . FLUoxetine  (PROZAC) capsule 10 mg  10 mg Oral Daily Max Sane, MD   10 mg at 12/09/14 1020  . [START ON 12/10/2014] furosemide (LASIX) injection 40 mg  40 mg Intravenous Daily Henreitta Leber, MD      . glipiZIDE (GLUCOTROL) tablet 5 mg  5 mg Oral BID WC Max Sane, MD   5 mg at 12/09/14 0838  . heparin injection 5,000 Units  5,000 Units Subcutaneous 3 times per day Max Sane, MD   5,000 Units at 12/09/14 1242  . insulin aspart (novoLOG) injection 0-5 Units  0-5 Units Subcutaneous QHS Max Sane, MD   0 Units at 12/08/14 2313  . insulin aspart (novoLOG) injection 0-9 Units  0-9 Units Subcutaneous TID WC Max Sane, MD   1 Units at 12/09/14 0837  . levalbuterol (XOPENEX) nebulizer solution 1.25 mg  1.25 mg Nebulization 4 times per day Henreitta Leber, MD   1.25 mg at 12/09/14 1335  . [START ON 12/10/2014] levothyroxine (SYNTHROID, LEVOTHROID) tablet 125 mcg  125 mcg Oral QAC breakfast Henreitta Leber, MD      . LORazepam (ATIVAN) tablet 0.5 mg  0.5 mg Oral BID PRN Max Sane, MD      . multivitamin with minerals tablet 1 tablet  1 tablet Oral Daily Max Sane, MD   1 tablet at 12/09/14 1020  . olopatadine (PATANOL) 0.1 % ophthalmic solution 1 drop  1 drop Both Eyes Daily Max Sane, MD   1 drop at 12/09/14 1022  . ondansetron (ZOFRAN) injection 4 mg  4 mg Intravenous Q6H PRN Max Sane, MD      . oxybutynin (DITROPAN-XL) 24 hr tablet 10 mg  10 mg Oral Daily Vipul Shah, MD   10 mg at 12/09/14 1020  . pantoprazole (PROTONIX) EC tablet 40 mg  40 mg Oral Daily Max Sane, MD   40 mg at 12/09/14 1020  . sodium bicarbonate tablet 650 mg  650 mg Oral BID Max Sane, MD   650 mg at 12/09/14 1020  . sodium chloride 0.9 % injection 3 mL  3 mL Intravenous Q12H Vipul Shah, MD   3 mL at 12/09/14 1021  . sodium chloride 0.9 % injection 3 mL  3 mL Intravenous PRN Max Sane, MD      . vitamin A & D ointment 1 application  1 application Topical Q M,W,F Max Sane, MD   1 application  at 12/09/14 1000  . Vitamin D  (Ergocalciferol) (DRISDOL) capsule 50,000 Units  50,000 Units Oral Q7 days Max Sane, MD   50,000 Units at 12/08/14 2233      Allergies: No Known Allergies    Past Medical History: Past Medical History  Diagnosis Date  . Renal disorder   . Diabetes mellitus without complication ([place])   . Thyroid disease   . Senile dementia   . Hypertension   . Atopic dermatitis   . Osteoarthritis   . Depressive disorder      Past Surgical History: History reviewed. No pertinent past surgical history.   Family History: No family history on file.   Social History: Social History   Social History  . Marital Status: Widowed    Spouse Name: N/A  . Number of Children: N/A  . Years of Education: N/A   Occupational History  . Not on file.   Social History Main Topics  . Smoking status: Never Smoker   . Smokeless tobacco: Not on file  . Alcohol Use: Not on file  . Drug Use: Not on file  . Sexual Activity: Not on file   Other Topics Concern  . Not on file   Social History Narrative  . No narrative on file     Review of Systems: Patient unable to provide reliable ROS due to underlying dementia.  Vital Signs: Blood pressure 155/82, pulse 80, temperature 98.2 F (36.8 C), temperature source Oral, resp. rate 18, height 5' 5"  (1.651 m), SpO2 95 %.  Weight trends: There were no vitals filed for this visit.  Physical Exam: General: NAD, plesantly confused  Head: Normocephalic, atraumatic.  Eyes: Anicteric, EOMI  Nose: Mucous membranes moist, not inflammed, nonerythematous.  Throat: Oropharynx nonerythematous, no exudate appreciated.   Neck: Supple, no obvious JVD  Lungs:  Bilateral expiratory wheezing, normal effort  Heart: Irregular, no rubs  Abdomen:  BS normoactive. Soft, Nondistended, non-tender.  No masses or organomegaly.  Extremities: No pretibial edema.  Neurologic: A&O X2, Motor strength is 5/5 in the all 4 extremities  Skin: No visible rashes, scars.    Lab  results: Basic Metabolic Panel:  Recent Labs Lab 12/08/14 1319 12/08/14 2049 12/09/14 0447  NA 132*  --  141  K 5.0  --  4.0  CL 105  --  107  CO2 23  --  25  GLUCOSE 186*  --  124*  BUN 40*  --  43*  CREATININE 2.64* 2.71* 2.80*  CALCIUM 7.6*  --  7.8*    Liver Function Tests: No results for input(s): AST, ALT, ALKPHOS, BILITOT, PROT, ALBUMIN in the last 168 hours. No results for input(s): LIPASE, AMYLASE in the last 168 hours. No results for input(s): AMMONIA in the last 168 hours.  CBC:  Recent Labs Lab 12/08/14 1319 12/08/14 2049 12/09/14 0447  WBC 5.1 5.1 4.7  NEUTROABS 3.6  --  2.8  HGB 9.1* 9.5* 8.6*  HCT 28.2* 28.6* 27.0*  MCV 99.8 99.6 100.1*  PLT 193 194 198    Cardiac Enzymes:  Recent Labs Lab 12/08/14 1319  TROPONINI 0.03    BNP: Invalid input(s): POCBNP  CBG:  Recent Labs Lab 12/08/14 1833 12/08/14 2301 12/09/14 0750 12/09/14 1149  GLUCAP 66 105* 125* 106*    Microbiology: No results found for this or any previous visit.  Coagulation Studies: No results for input(s): LABPROT, INR in the last 72 hours.  Urinalysis: No results for input(s): COLORURINE, LABSPEC, [place], [place], [place], BILIRUBINUR, KETONESUR,  PROTEINUR, UROBILINOGEN, NITRITE, LEUKOCYTESUR in the last 72 hours.  Invalid input(s): APPERANCEUR    Imaging: Dg Chest 2 View  12/08/2014  CLINICAL DATA:  81 year old with acute confusion superimposed upon baseline dementia. Wheezing and hypoxemia identified upon EMS arrival. Personal history of breast cancer post right mastectomy. EXAM: CHEST  2 VIEW COMPARISON:  CT chest 06/03/2013. Chest x-rays 04/06/2013 and earlier. FINDINGS: Cardiac silhouette mildly enlarged for technique, unchanged. Mild-to-moderate diffuse interstitial pulmonary edema. Bilateral pleural effusions and associated mild consolidation in the lower lobes. IMPRESSION: Mild to moderate acute CHF, with stable mild cardiomegaly and mild to moderate diffuse  interstitial pulmonary edema. Bilateral pleural effusions and associated passive atelectasis in the lower lobes. Electronically Signed   By: Evangeline Dakin M.D.   On: 12/08/2014 13:37      Assessment & Plan: Pt is a 81 y.o. yo female with a PMHX of chronic kidney disease stage IV baseline EGFR 29 and creatinine of 1.7, diabetes mellitus type 2, dementia, hypertension, osteophytes, depression, who was admitted to [facility] on 12/08/2014 for evaluation of altered mental status.   1.  Acute renal failure/CKD stage IV baseline Cr 1.7 egfr 29.  Acute renal failure likely secondary to altered cardiorenal hemodynamics. Chronic kidney disease is likely multifactorial with contributions from diabetes mellitus, hypertension, and age. -Renal ultrasound has been ordered. We will review this once available.  Discussed the case with cardiology.  We will reduce Lasix to 40 mg IV daily.  If creatinine continues to rise we may need to consider actually stopping Lasix and providing IV fluid hydration.  No urgent indication for dialysis at present.  We will also obtain further serologic workup.  2.  Acute diastolic heart failure. Ejection fraction was found to be 45-50% however right-sided pressures were elevated.  Patient being given Lasix 40 mg IV daily for now.  May need to stop if renal function continues to worsen.  3.  Anemia of CKD:  Hgb drifting down, currently 8.6.  No urgent indication for procrit, but will consider if hgb continues to drift down.

## 2014-12-09 NOTE — Progress Notes (Signed)
Patient: ZYAH GOMM / Admit Date: 12/08/2014 / Date of Encounter: 12/09/2014, 8:37 AM   Subjective: Still with Sob, mildly improved, Did not sleep ("worried") No leg swelling, still with cough and wheezing Tele showing HR 90s On 5 liters [place] oxygen  Review of Systems: Review of Systems  Constitutional: Negative.        Gait instability  Respiratory: Positive for cough, shortness of breath and wheezing.   Cardiovascular: Negative.   Gastrointestinal: Negative.   Musculoskeletal: Negative.   Neurological: Negative.   Psychiatric/Behavioral: Negative.   All other systems reviewed and are negative.  Objective: Telemetry: atrial fib with rate 90s Physical Exam: Blood pressure 135/82, pulse 92, temperature 98.8 F (37.1 C), temperature source Oral, resp. rate 26, height '5\' 5"'$  (1.651 m), SpO2 89 %. There is no weight on file to calculate BMI. General: Well developed, well nourished, in no acute distress. Head: Normocephalic, atraumatic, sclera non-icteric, no xanthomas, nares are without discharge. Neck: Negative for carotid bruits. JVP not elevated. Lungs: Clear bilaterally to auscultation without wheezes, rales, or rhonchi. Breathing is unlabored. Heart: RRR S1 S2 without murmurs, rubs, or gallops.  Abdomen: Soft, non-tender, non-distended with normoactive bowel sounds. No rebound/guarding. Extremities: No clubbing or cyanosis. No edema. Distal pedal pulses are 2+ and equal bilaterally. Neuro: Alert and oriented X 3. Moves all extremities spontaneously. Psych:  Responds to questions appropriately with a normal affect.   Intake/Output Summary (Last 24 hours) at 12/09/14 0837 Last data filed at 12/09/14 0300  Gross per 24 hour  Intake      0 ml  Output    550 ml  Net   -550 ml    Inpatient Medications:  . amLODipine  5 mg Oral Daily  . aspirin EC  81 mg Oral Daily  . carvedilol  3.125 mg Oral BID  . cetirizine  10 mg Oral Daily  . FLUoxetine  10 mg Oral Daily  .  furosemide  40 mg Intravenous BID  . glipiZIDE  5 mg Oral BID WC  . heparin  5,000 Units Subcutaneous 3 times per day  . insulin aspart  0-5 Units Subcutaneous QHS  . insulin aspart  0-9 Units Subcutaneous TID WC  . levothyroxine  100 mcg Oral QAC breakfast  . multivitamin with minerals  1 tablet Oral Daily  . olopatadine  1 drop Both Eyes Daily  . oxybutynin  10 mg Oral Daily  . pantoprazole  40 mg Oral Daily  . sodium bicarbonate  650 mg Oral BID  . sodium chloride  3 mL Intravenous Q12H  . vitamin A & D  1 application Topical Q M,W,F  . Vitamin D (Ergocalciferol)  50,000 Units Oral Q7 days   Infusions:    Labs:  Recent Labs  12/08/14 1319 12/08/14 2049 12/09/14 0447  NA 132*  --  141  K 5.0  --  4.0  CL 105  --  107  CO2 23  --  25  GLUCOSE 186*  --  124*  BUN 40*  --  43*  CREATININE 2.64* 2.71* 2.80*  CALCIUM 7.6*  --  7.8*   No results for input(s): AST, ALT, ALKPHOS, BILITOT, PROT, ALBUMIN in the last 72 hours.  Recent Labs  12/08/14 1319 12/08/14 2049  WBC 5.1 5.1  NEUTROABS 3.6  --   HGB 9.1* 9.5*  HCT 28.2* 28.6*  MCV 99.8 99.6  PLT 193 194    Recent Labs  12/08/14 1319  TROPONINI 0.03  Invalid input(s): POCBNP  Recent Labs  12/08/14 2049  HGBA1C 6.8*     Weights: There were no vitals filed for this visit.   Radiology/Studies:  Dg Chest 2 View  12/08/2014  CLINICAL DATA:  81 year old with acute confusion superimposed upon baseline dementia. Wheezing and hypoxemia identified upon EMS arrival. Personal history of breast cancer post right mastectomy. EXAM: CHEST  2 VIEW COMPARISON:  CT chest 06/03/2013. Chest x-rays 04/06/2013 and earlier. FINDINGS: Cardiac silhouette mildly enlarged for technique, unchanged. Mild-to-moderate diffuse interstitial pulmonary edema. Bilateral pleural effusions and associated mild consolidation in the lower lobes. IMPRESSION: Mild to moderate acute CHF, with stable mild cardiomegaly and mild to moderate diffuse  interstitial pulmonary edema. Bilateral pleural effusions and associated passive atelectasis in the lower lobes. Electronically Signed   By: Evangeline Dakin M.D.   On: 12/08/2014 13:37     Assessment and Plan  81 y.o. female with a history of Metastatic breast cancer status post right-sided mastectomy with chemotherapy and radiation, currently negative for malignancy, history of diabetes, hypertension, hypothyroidism who lives at [facility] assisted [facility] home. She presents today with 3-4 days of worsening shortness of breath, weakness, giving out. Found to be in atrial fibrillation in the emergency room.  1) respiratory distress, hypoxia  secondary to CHF. No prior smoking history to explain her wheezing. Unable to exclude bronchitis as well, reports it started in the past few days  EF mildly depressed Chest x-ray confirming pulmonary edema, pleural effusions No known coronary artery disease, cardiac enzymes negative Sx  exacerbated by new atrial fibrillation --Renal function stable, would continue  Lasix 40 mg IV BID ---may need xopenex treatments, ABX (even steroids if sx do not improve)  2) atrial fibrillation Possibly persistent. Timing of onset is unclear. Unable to exclude onset of atrial fibrillation 4 days ago given symptoms got worse at that time. May have had chronic atrial fibrillation for long period time, CHF symptoms in the past several days.  --Not a good candidate for anticoagulation given her anemia, high fall risk. Family confirms she has had recent fall. Should be walking with a walker, only walks with a cane at the nursing home.  metoprolol 25 mg twice a day with slow titration upwards as tolerated.  3) chronic renal failure, stage IV Worse than 2015, creatinine now 2.5 She reports that she sees nephrology  4) anemia Likely contributing to her shortness of breath symptoms, tachycardia Unclear if this is iron deficiency, would run a iron panel candidate for  EPO?  5) Breast cancer, history of mastectomy  surgery on the right with lymphedema of the right arm Seen by oncology, by report is in remission  6) pleural effusions Seen on chest x-ray Secondary to CHF  Signed, Esmond Plants, MD [facility] 12/09/2014, 8:37 AM

## 2014-12-09 NOTE — Care Management Important Message (Signed)
Important Message  Patient Details  Name: Paige James MRN: [id number] Date of Birth: [birth year]   Medicare Important Message Given:  Yes-second notification given    Katrina Stack, RN 12/09/2014, 5:40 PM

## 2014-12-09 NOTE — Care Management (Signed)
Was contacted by [name] with DSS informing that patient [place] a ward of the county.  Patient has a daughter that can be informed about care and plan but she is not to make any health care decisions.  Patient presented from [place] but at discharge, patient is to discharge to [facility] on [facility].  Tracy's phone number is [phone]

## 2014-12-09 NOTE — Progress Notes (Signed)
[facility] NAME: Paige James    MR#:  [id number]  DATE OF BIRTH:  1920/10/23  SUBJECTIVE:  CHIEF COMPLAINT:   Chief Complaint  Patient presents with  . Altered Mental Status   Patient here due to altered mental status and also noted to be short of breath. Patient was noted to be in new onset atrial fibrillation also noted to have mild acute respiratory failure. Family at bedside this morning. To having some wheezing/bronchospasm. Renal function about the same.  REVIEW OF SYSTEMS:    Review of Systems  Constitutional: Negative for fever and chills.  HENT: Negative for congestion and tinnitus.   Eyes: Negative for blurred vision and double vision.  Respiratory: Positive for cough and shortness of breath. Negative for wheezing.   Cardiovascular: Negative for chest pain, orthopnea and PND.  Gastrointestinal: Negative for nausea, vomiting, abdominal pain and diarrhea.  Genitourinary: Negative for dysuria and hematuria.  Neurological: Positive for weakness (generalized). Negative for dizziness, sensory change and focal weakness.  All other systems reviewed and are negative.   Nutrition: Renal heart healthy Tolerating Diet: Yes Tolerating PT: Yes   DRUG ALLERGIES:  No Known Allergies  VITALS:  Blood pressure 155/82, pulse 80, temperature 98.2 F (36.8 C), temperature source Oral, resp. rate 18, height '5\' 5"'$  (1.651 m), SpO2 95 %.  PHYSICAL EXAMINATION:   Physical Exam  GENERAL:  81 y.o.-year-old patient lying in the bed with no acute distress.  EYES: Pupils equal, round, reactive to light and accommodation. No scleral icterus. Extraocular muscles intact.  HEENT: Head atraumatic, normocephalic. Oropharynx and nasopharynx clear.  NECK:  Supple, no jugular venous distention. No thyroid enlargement, no tenderness.  LUNGS: No evidence of accessory muscles. Positive diffuse wheezing. Good air entry bilaterally.   CARDIOVASCULAR: S1, S2 irregular. No murmurs, rubs, or gallops.  ABDOMEN: Soft, nontender, nondistended. Bowel sounds present. No organomegaly or mass.  EXTREMITIES: No cyanosis, clubbing or edema b/l.    NEUROLOGIC: Cranial nerves II through XII are intact. No focal Motor or sensory deficits b/l.   PSYCHIATRIC: The patient is alert and oriented x 3.  SKIN: No obvious rash, lesion, or ulcer.    LABORATORY PANEL:   CBC  Recent Labs Lab 12/09/14 0447  WBC 4.7  HGB 8.6*  HCT 27.0*  PLT 198   ------------------------------------------------------------------------------------------------------------------  Chemistries   Recent Labs Lab 12/09/14 0447  NA 141  K 4.0  CL 107  CO2 25  GLUCOSE 124*  BUN 43*  CREATININE 2.80*  CALCIUM 7.8*   ------------------------------------------------------------------------------------------------------------------  Cardiac Enzymes  Recent Labs Lab 12/08/14 1319  TROPONINI 0.03   ------------------------------------------------------------------------------------------------------------------  RADIOLOGY:  Dg Chest 2 View  12/08/2014  CLINICAL DATA:  81 year old with acute confusion superimposed upon baseline dementia. Wheezing and hypoxemia identified upon EMS arrival. Personal history of breast cancer post right mastectomy. EXAM: CHEST  2 VIEW COMPARISON:  CT chest 06/03/2013. Chest x-rays 04/06/2013 and earlier. FINDINGS: Cardiac silhouette mildly enlarged for technique, unchanged. Mild-to-moderate diffuse interstitial pulmonary edema. Bilateral pleural effusions and associated mild consolidation in the lower lobes. IMPRESSION: Mild to moderate acute CHF, with stable mild cardiomegaly and mild to moderate diffuse interstitial pulmonary edema. Bilateral pleural effusions and associated passive atelectasis in the lower lobes. Electronically Signed   By: Evangeline Dakin M.D.   On: 12/08/2014 13:37   US Renal  12/09/2014  CLINICAL  DATA:  Chronic renal failure. EXAM: RENAL / URINARY TRACT ULTRASOUND COMPLETE COMPARISON:  PET-CT dated  06/12/2011 and abdominal ultrasound dated 08/25/2011 FINDINGS: Right Kidney: Length: 8.0 cm. Increased echogenicity of the renal parenchyma. No hydronephrosis or mass lesion. Left Kidney: Length: 8.4 cm. Increased echogenicity of the renal parenchyma. No hydronephrosis or mass lesion. Bladder: Appears normal for degree of bladder distention. IMPRESSION: Bilateral echogenic kidneys consistent with renal medical disease. No obstruction. Electronically Signed   By: Lorriane Shire M.D.   On: 12/09/2014 15:09     ASSESSMENT AND PLAN:   81 year old female with past medical history of chronic kidney disease stage III, hypertension, type 2 diabetes without, condition, dementia, osteoarthritis, hypothyroidism who presented to the hospital due to shortness of breath and also mental status.  #1 altered mental status-this is likely metabolic encephalopathy from her acute renal failure and also acute respiratory failure. -Mental status a bit improved since yesterday. We'll continue to monitor. -Continue Zithromax for possible bronchitis.  #2 acute respiratory failure with hypoxia-likely secondary to mild CHF, continue with underlying acute bronchitis. -Continue IV Lasix to treat CHF. Started patient on some Xopenex nebs, Zithromax for the acute bronchitis.  #3 acute bronchitis-continue Xopenex nebs, Zithromax.  #4 CHF-acute on chronic systolic dysfunction. -Patient's echocardiogram showed mild LV dysfunction with EF of 45%. -Appreciate cardiology input. Continue Lasix, Coreg. Follow I's and O's and daily weights.  #5 new-onset atrial fibrillation-rate controlled and continue Coreg, Cardizem. -Given her advanced age and high fall risk she is likely not a long-term antibiotic lotion candidate. -Continue aspirin. Appreciate cardiology input  #6 chronic kidney disease stage III-IV-patient likely has  chronic kidney disease secondary to diabetes hypertension/age. Current acute increase in creatinine could be cardiorenal given her CHF. -Continue gentle diuresis. And follow BUN and creatinine and urine output. -Appreciate nephrology consult. Await renal ultrasound.   #7 depression-continue Prozac  #8 urinary incontinence-continue oxybutynin.  #9 GERD-continue Protonix.  #10 hypothyroidism-continue Synthroid. Patient's TSH is slightly elevated. We'll increase her Synthroid dose slightly upon discharge   All the records are reviewed and case discussed with Care Management/Social Workerr. Management plans discussed with the patient, family and they are in agreement.  CODE STATUS: DO NOT RESUSCITATE  DVT Prophylaxis: Heparin subcutaneous  TOTAL TIME TAKING CARE OF THIS PATIENT: 35 minutes.   POSSIBLE D/C IN 1-2 DAYS, DEPENDING ON CLINICAL CONDITION.  Greater than 50% of time spent in coordination of care and discussion with patient's family, nephrology, cardiology.  Henreitta Leber M.D on 12/09/2014 at 3:48 PM  Between 7am to 6pm - Pager - [phone]  After 6pm go to [email or web address] - password [email or web address] [facility]  [facility]  Office  [phone]  CC: Primary care physician; No primary care provider on file.

## 2014-12-09 NOTE — Evaluation (Signed)
Physical Therapy Evaluation Patient Details Name: Paige James MRN: [id number] DOB: Apr 25, 1920 Today's Date: 12/09/2014   History of Present Illness  Pt is a 81 y.o. female presenting to hospital with AMS, hypoxia, and new onset a-fib.  PMH includes dementia, metastatic breast CA s/p R mastectomy with chemotherapy and radiation, s/p B TKR.  Clinical Impression  Currently pt demonstrates impairments with safety, balance, strength, and limitations with functional mobility.  Prior to admission, pt reports ambulating independently without AD at ALF.  Pt appearing impulsive during session and pt verbalizing the need to be "independent" requiring increased assist for safety.  Per case management, plan for pt to discharge to [facility] upon discharge (instead of [facility] that she came from).  Currently pt is CGA with transfers and gait with RW for safety (used RW d/t pt's knees appearing to buckle a few times when initially standing--pt self corrected using RW-- but pt also unsafe with RW pushing it too far forward with gait, quick with turns, and demonstrating very fast ambulation speed.  Wheezing noted with all activity but O2 sats 93% or above on 3 L/min via nasal cannula entire session.  Pt would benefit from skilled PT to address above noted impairments and functional limitations.  Recommend pt discharge to ALF with assist for functional mobility for safety and HHPT when medically appropriate.     Follow Up Recommendations Home health PT;Supervision for mobility/OOB    Equipment Recommendations  Rolling walker with 5" wheels (pending further assessment)    Recommendations for Other Services       Precautions / Restrictions Precautions Precautions: Fall Restrictions Weight Bearing Restrictions: No      Mobility  Bed Mobility Overal bed mobility: Needs Assistance Bed Mobility: Supine to Sit     Supine to sit: Supervision     General bed mobility comments: SBA  for safety d/t impulsiveness  Transfers Overall transfer level: Needs assistance Equipment used: Rolling walker (2 wheeled) Transfers: Sit to/from Stand           General transfer comment: pt's B knees appeared to buckle a few times upon standing but then able to transfer to chair with CGA and RW  Ambulation/Gait Ambulation/Gait assistance: Min guard Ambulation Distance (Feet): 40 Feet Assistive device: Rolling walker (2 wheeled) Gait Pattern/deviations: Step-through pattern Gait velocity: increased   General Gait Details: impulsive, pt pushing RW far in front of her and quick with turns but no loss of balance or knee buckling noted with gait  Stairs            Wheelchair Mobility    Modified Rankin (Stroke Patients Only)       Balance Overall balance assessment: Needs assistance Sitting-balance support: Bilateral upper extremity supported;Feet supported Sitting balance-Leahy Scale: Good     Standing balance support: Bilateral upper extremity supported (on RW) Standing balance-Leahy Scale: Good                               Pertinent Vitals/Pain Pain Assessment: No/denies pain  Vitals stable and WFL throughout treatment session.    Home Living Family/patient expects to be discharged to:: Assisted living                 Additional Comments: Pt reports not having an AD (although per notes may have cane)    Prior Function           Comments: Pt reports ambulating without  AD     Hand Dominance        Extremity/Trunk Assessment   Upper Extremity Assessment: Generalized weakness           Lower Extremity Assessment: Generalized weakness         Communication   Communication: No difficulties  Cognition Arousal/Alertness: Awake/alert Behavior During Therapy: Impulsive Overall Cognitive Status:  (Oriented to self and place; disoriented to time)                      General Comments   Nursing cleared pt for  participation in physical therapy.  Pt agreeable to PT session.    Exercises        Assessment/Plan    PT Assessment Patient needs continued PT services  PT Diagnosis Generalized weakness;Difficulty walking   PT Problem List Decreased strength;Decreased activity tolerance;Decreased balance;Decreased mobility;Decreased knowledge of use of DME;Decreased safety awareness;Decreased knowledge of precautions  PT Treatment Interventions DME instruction;Gait training;Functional mobility training;Therapeutic activities;Therapeutic exercise;Balance training;Patient/family education   PT Goals (Current goals can be found in the Care Plan section) Acute Rehab PT Goals Patient Stated Goal: to get rid of "cold" PT Goal Formulation: With patient Time For Goal Achievement: 12/23/14 Potential to Achieve Goals: Good    Frequency Min 2X/week   Barriers to discharge        Co-evaluation               End of Session Equipment Utilized During Treatment: Gait belt;Oxygen Activity Tolerance: Patient tolerated treatment well (Wheezing noted though with activity) Patient left: in chair;with call bell/phone within reach;with chair alarm set Nurse Communication: Mobility status;Precautions         Time: 4562-5638 PT Time Calculation (min) (ACUTE ONLY): 23 min   Charges:   PT Evaluation $Initial PT Evaluation Tier I: 1 Procedure     PT G CodesLeitha Bleak 12-20-14, 1:09 PM Leitha Bleak, [place]

## 2014-12-09 NOTE — Progress Notes (Signed)
Patient was admitted from the ED via stretcher. She was on 3L of acute supplemental oxygen, she was alert and oriented to self, place and time. Patient was  Oriented to her room and admission documentation that patient could provide were documented. Patient was educated about fall prevention,how to use the call bell and the importance of the bed alarm. Patient was mostly cooperative, remained in asymptomatic afib, with stable VS. She remained hemodynamically stable.

## 2014-12-09 NOTE — Progress Notes (Signed)
Inpatient Diabetes Program Recommendations  AACE/ADA: New Consensus Statement on Inpatient Glycemic Control (2015)  Target Ranges:  Prepandial:   less than 140 mg/dL      Peak postprandial:   less than 180 mg/dL (1-2 hours)      Critically ill patients:  140 - 180 mg/dL  Results for RUFINA, KIMERY (MRN [id number]) as of 12/09/2014 09:29  Ref. Range 12/08/2014 18:33 12/08/2014 23:01 12/09/2014 07:50  Glucose-Capillary Latest Ref Range: 65-99 mg/dL 66 105 (H) 125 (H)   Review of Glycemic Control   Diabetes history: DM2 Outpatient Diabetes medications: Glipizide 5 mg BID Current orders for Inpatient glycemic control: Novolog 0-9 units TID with meals, Novolog 0-5 units HS, Glipizide 5 mg BID  Inpatient Diabetes Program Recommendations:   NOTE: Noted consult for diabetes coordinator. Reviewed chart. Glucose 66 mg/dl yesterday at 18:33 and fasting glucose 125 mg/dl this morning. If patient has any other episodes of hypoglycemia, may want to consider discontinuing Glipizide while inpatient and just use Novolog correction insulin if needed. Will continue to follow.  Thanks, Barnie Alderman, RN, MSN, CCRN, CDE Diabetes Coordinator Inpatient Diabetes Program [phone] (Team Pager from [place] to [place]) [phone] ([place] office) [phone] [facility] office) [phone] [facility] office)

## 2014-12-09 NOTE — Clinical Social Work Note (Addendum)
Clinical Social Work Assessment  Patient Details  Name: Paige James MRN: [id number] Date of Birth: [birth year]  Date of referral:  12/09/14               Reason for consult:   (from [place] will not DC back)                Permission sought to share information with:  Guardian, Customer service manager Permission granted to share information::  Yes, Verbal Permission Granted  Name::      (Daughter  Handley, DSS guardian [name]  [phone])  Agency::     Relationship::     Contact Information:     Housing/Transportation Living arrangements for the past 2 months:  [place] of Information:  Patient (DSS Guardian) Patient Interpreter Needed:  None Criminal Activity/Legal Involvement Pertinent to Current Situation/Hospitalization:  No - Comment as needed Significant Relationships:  Adult Children (ALF  and DSS guardian) Lives with:  Facility Resident Do you feel safe going back to the place where you live?  Yes Need for family participation in patient care:  Yes (Comment) (DSS Guardian )  Care giving concerns:  None at this time   Facilities manager / plan:  Holiday representative (CSW) in to assess patient for ongoing and disposition needs. Patient is engaged in conversation with CSW. States she lives at care home, but wants to go home. Informed CSW she has a home.  States she has three children two living.  Patient stated she enjoyed her breakfast, was going to drink her juice and take a nap.  (Additional Information provided by DSS guardian [name]).   Patient currently lives at [facility], has lived there a little over a year.  Ptient's support system is her DSS guardian and her daughter Carnell.  Per DSS guardian, patient was schedule to move to a new ALF.  This will happen when she is discharged from the hospital.  Per [name] patient will discharge to  [facility] [address] (aske for [place] or [place]).    PT  consult completed with Homehealth PT.  CSW will complete FL2, place a copy on chart in anticipation of patient returning to ALF at discharge.       Employment status:  Disabled (Comment on whether or not currently receiving Disability) Insurance information:  Programmer, applications [facility]) PT Recommendations:  Not assessed at this time Information / Referral to community resources:   (none at this time)  Patient/Family's Response to care:  Patient's guardian is in agreement with patient going to ALF at discharge.   Patient/Family's Understanding of and Emotional Response to Diagnosis, Current Treatment, and Prognosis:   Patient's guardian understands patient will continue under medical workup.  Once medially clear by MD will discharge to New ALF.  Emotional Assessment Appearance:  Appears stated age Attitude/Demeanor/Rapport:    Affect (typically observed):  Pleasant, Appropriate, Adaptable, Hopeful Orientation:  Oriented to Self, Oriented to Place, Oriented to Situation, Oriented to  Time Alcohol / Substance use:    Psych involvement (Current and /or in the community):  No (Comment)  Discharge Needs  Concerns to be addressed:  Care Coordination Readmission within the last 30 days:  No Current discharge risk:  Chronically ill Barriers to Discharge:  Continued Medical Work up, No Barriers Identified   Maurine Cane, LCSW 12/09/2014, 11:12 AM Casimer Lanius. [name], MSW Clinical Social Work Department Emergency Room [phone] 12:56 PM

## 2014-12-10 ENCOUNTER — Inpatient Hospital Stay: Payer: Medicare Other

## 2014-12-10 DIAGNOSIS — N184 Chronic kidney disease, stage 4 (severe): Secondary | ICD-10-CM | POA: Insufficient documentation

## 2014-12-10 LAB — PROTEIN ELECTROPHORESIS, SERUM
A/G Ratio: 1 (ref 0.7–1.7)
ALBUMIN ELP: 3.1 g/dL (ref 2.9–4.4)
ALPHA-1-GLOBULIN: 0.1 g/dL (ref 0.0–0.4)
ALPHA-2-GLOBULIN: 0.8 g/dL (ref 0.4–1.0)
BETA GLOBULIN: 0.9 g/dL (ref 0.7–1.3)
GAMMA GLOBULIN: 1.4 g/dL (ref 0.4–1.8)
Globulin, Total: 3.2 g/dL (ref 2.2–3.9)
Total Protein ELP: 6.3 g/dL (ref 6.0–8.5)

## 2014-12-10 LAB — BASIC METABOLIC PANEL
Anion gap: 7 (ref 5–15)
BUN: 47 mg/dL — AB (ref 6–20)
CO2: 26 mmol/L (ref 22–32)
CREATININE: 3.02 mg/dL — AB (ref 0.44–1.00)
Calcium: 8.2 mg/dL — ABNORMAL LOW (ref 8.9–10.3)
Chloride: 104 mmol/L (ref 101–111)
GFR calc Af Amer: 14 mL/min — ABNORMAL LOW (ref >60)
GFR, EST NON AFRICAN AMERICAN: 12 mL/min — AB (ref >60)
GLUCOSE: 192 mg/dL — AB (ref 65–99)
Potassium: 4.3 mmol/L (ref 3.5–5.1)
SODIUM: 137 mmol/L (ref 135–145)

## 2014-12-10 LAB — VITAMIN D 25 HYDROXY (VIT D DEFICIENCY, FRACTURES): VIT D 25 HYDROXY: 35.4 ng/mL (ref 30.0–100.0)

## 2014-12-10 LAB — KAPPA/LAMBDA LIGHT CHAINS
Kappa free light chain: 185.99 mg/L — ABNORMAL HIGH (ref 3.30–19.40)
Kappa, lambda light chain ratio: 3.04 — ABNORMAL HIGH (ref 0.26–1.65)
Lambda free light chains: 61.18 mg/L — ABNORMAL HIGH (ref 5.71–26.30)

## 2014-12-10 LAB — CBC
HEMATOCRIT: 29.1 % — AB (ref 35.0–47.0)
Hemoglobin: 9.4 g/dL — ABNORMAL LOW (ref 12.0–16.0)
MCH: 32.4 pg (ref 26.0–34.0)
MCHC: 32.2 g/dL (ref 32.0–36.0)
MCV: 100.4 fL — AB (ref 80.0–100.0)
PLATELETS: 218 10*3/uL (ref 150–440)
RBC: 2.89 MIL/uL — ABNORMAL LOW (ref 3.80–5.20)
RDW: 15.4 % — AB (ref 11.5–14.5)
WBC: 4.6 10*3/uL (ref 3.6–11.0)

## 2014-12-10 LAB — GLUCOSE, CAPILLARY
GLUCOSE-CAPILLARY: 102 mg/dL — AB (ref 65–99)
Glucose-Capillary: 169 mg/dL — ABNORMAL HIGH (ref 65–99)
Glucose-Capillary: 129 mg/dL — ABNORMAL HIGH (ref 65–99)

## 2014-12-10 LAB — PROTEIN / CREATININE RATIO, URINE
CREATININE, URINE: 203 mg/dL
PROTEIN CREATININE RATIO: 1.35 mg/mg{creat} — AB (ref 0.00–0.15)
TOTAL PROTEIN, URINE: 274 mg/dL

## 2014-12-10 LAB — PARATHYROID HORMONE, INTACT (NO CA): PTH: 297 pg/mL — ABNORMAL HIGH (ref 15–65)

## 2014-12-10 MED ORDER — METHYLPREDNISOLONE SODIUM SUCC 40 MG IJ SOLR
40.0000 mg | Freq: Two times a day (BID) | INTRAMUSCULAR | Status: DC
  Administered 2014-12-11: 40 mg via INTRAVENOUS
  Filled 2014-12-10 (×2): qty 1

## 2014-12-10 MED ORDER — LEVALBUTEROL HCL 1.25 MG/0.5ML IN NEBU
1.2500 mg | INHALATION_SOLUTION | Freq: Four times a day (QID) | RESPIRATORY_TRACT | Status: DC
  Administered 2014-12-10 – 2014-12-11 (×2): 1.25 mg via RESPIRATORY_TRACT
  Filled 2014-12-10 (×3): qty 0.5

## 2014-12-10 MED ORDER — DILTIAZEM HCL 30 MG PO TABS
30.0000 mg | ORAL_TABLET | Freq: Two times a day (BID) | ORAL | Status: DC
  Administered 2014-12-11 – 2014-12-12 (×3): 30 mg via ORAL
  Filled 2014-12-10 (×3): qty 1

## 2014-12-10 MED ORDER — HALOPERIDOL LACTATE 5 MG/ML IJ SOLN
2.5000 mg | Freq: Once | INTRAMUSCULAR | Status: AC
Start: 2014-12-10 — End: 2014-12-10
  Administered 2014-12-10: 2.5 mg via INTRAMUSCULAR

## 2014-12-10 MED ORDER — SODIUM CHLORIDE 0.9 % IV SOLN
INTRAVENOUS | Status: DC
  Administered 2014-12-10 – 2014-12-14 (×3): via INTRAVENOUS

## 2014-12-10 MED ORDER — HALOPERIDOL LACTATE 5 MG/ML IJ SOLN
0.5000 mg | Freq: Four times a day (QID) | INTRAMUSCULAR | Status: DC | PRN
  Filled 2014-12-10 (×2): qty 1

## 2014-12-10 MED ORDER — HALOPERIDOL LACTATE 5 MG/ML IJ SOLN
5.0000 mg | Freq: Once | INTRAMUSCULAR | Status: AC
  Administered 2014-12-10: 5 mg via INTRAMUSCULAR

## 2014-12-10 NOTE — Progress Notes (Signed)
[facility] NAME: Paige James    MR#:  [id number]  DATE OF BIRTH:  [birth year]  SUBJECTIVE:  CHIEF COMPLAINT:   Chief Complaint  Patient presents with  . Altered Mental Status   Confused this morning. Still has some wheezing/bronchospasm. Renal function a bit worse today.  REVIEW OF SYSTEMS:    Review of Systems  Constitutional: Negative for fever and chills.  HENT: Negative for congestion and tinnitus.   Eyes: Negative for blurred vision and double vision.  Respiratory: Positive for cough and shortness of breath. Negative for wheezing.   Cardiovascular: Negative for chest pain, orthopnea and PND.  Gastrointestinal: Negative for nausea, vomiting, abdominal pain and diarrhea.  Genitourinary: Negative for dysuria and hematuria.  Neurological: Positive for weakness (generalized). Negative for dizziness, sensory change and focal weakness.  All other systems reviewed and are negative.   Nutrition: Renal heart healthy Tolerating Diet: Yes Tolerating PT: Yes   DRUG ALLERGIES:  No Known Allergies  VITALS:  Blood pressure 133/71, pulse 71, temperature 97.7 F (36.5 C), temperature source Oral, resp. rate 19, height '5\' 5"'$  (1.651 m), weight 87.454 kg (192 lb 12.8 oz), SpO2 95 %.  PHYSICAL EXAMINATION:   Physical Exam  GENERAL:  81 y.o.-year-old patient lying in the bed in no acute distress.  EYES: Pupils equal, round, reactive to light and accommodation. No scleral icterus. Extraocular muscles intact.  HEENT: Head atraumatic, normocephalic. Oropharynx and nasopharynx clear.  NECK:  Supple, no jugular venous distention. No thyroid enlargement, no tenderness.  LUNGS: No evidence of accessory muscles. Diffuse end-expiratory wheezing bilaterally. Good air entry bilaterally. No rhonchi, rales CARDIOVASCULAR: S1, S2 irregular. No murmurs, rubs, or gallops.  ABDOMEN: Soft, nontender, nondistended. Bowel sounds present. No  organomegaly or mass.  EXTREMITIES: No cyanosis, clubbing or edema b/l.    NEUROLOGIC: Cranial nerves II through XII are intact. No focal Motor or sensory deficits b/l.   PSYCHIATRIC: The patient is alert and oriented x 1.  SKIN: No obvious rash, lesion, or ulcer.    LABORATORY PANEL:   CBC  Recent Labs Lab 12/10/14 0402  WBC 4.6  HGB 9.4*  HCT 29.1*  PLT 218   ------------------------------------------------------------------------------------------------------------------  Chemistries   Recent Labs Lab 12/10/14 0402  NA 137  K 4.3  CL 104  CO2 26  GLUCOSE 192*  BUN 47*  CREATININE 3.02*  CALCIUM 8.2*   ------------------------------------------------------------------------------------------------------------------  Cardiac Enzymes  Recent Labs Lab 12/08/14 1319  TROPONINI 0.03   ------------------------------------------------------------------------------------------------------------------  RADIOLOGY:  Dg Chest 2 View  12/08/2014  CLINICAL DATA:  81 year old with acute confusion superimposed upon baseline dementia. Wheezing and hypoxemia identified upon EMS arrival. Personal history of breast cancer post right mastectomy. EXAM: CHEST  2 VIEW COMPARISON:  CT chest 06/03/2013. Chest x-rays 04/06/2013 and earlier. FINDINGS: Cardiac silhouette mildly enlarged for technique, unchanged. Mild-to-moderate diffuse interstitial pulmonary edema. Bilateral pleural effusions and associated mild consolidation in the lower lobes. IMPRESSION: Mild to moderate acute CHF, with stable mild cardiomegaly and mild to moderate diffuse interstitial pulmonary edema. Bilateral pleural effusions and associated passive atelectasis in the lower lobes. Electronically Signed   By: Evangeline Dakin M.D.   On: 12/08/2014 13:37   US Renal  12/09/2014  CLINICAL DATA:  Chronic renal failure. EXAM: RENAL / URINARY TRACT ULTRASOUND COMPLETE COMPARISON:  PET-CT dated 06/12/2011 and abdominal  ultrasound dated 08/25/2011 FINDINGS: Right Kidney: Length: 8.0 cm. Increased echogenicity of the renal parenchyma. No hydronephrosis or mass lesion. Left  Kidney: Length: 8.4 cm. Increased echogenicity of the renal parenchyma. No hydronephrosis or mass lesion. Bladder: Appears normal for degree of bladder distention. IMPRESSION: Bilateral echogenic kidneys consistent with renal medical disease. No obstruction. Electronically Signed   By: Lorriane Shire M.D.   On: 12/09/2014 15:09     ASSESSMENT AND PLAN:   81 year old female with past medical history of chronic kidney disease stage III, hypertension, type 2 diabetes without, condition, dementia, osteoarthritis, hypothyroidism who presented to the hospital due to shortness of breath and also mental status.  #1 altered mental status-this is likely metabolic encephalopathy from her acute renal failure and also acute respiratory failure. -Still a bit confused today. Likely had some sundowning. We'll avoid benzodiazepines. -We'll order some when necessary Haldol for agitation if needed overnight. -Continue to follow mental status  #2 acute respiratory failure with hypoxia-likely secondary to mild CHF/underlying acute bronchitis. -Lasix stopped as renal function was getting worse.  -Continue Xopenex nebulizers. We'll add some low-dose IV steroids. I will get a CT chest non-contrast.  #3 acute bronchitis-continue Xopenex nebs, Zithromax. -Add IV steroids, get CT chest noncontrast.  #4 CHF-acute on chronic systolic dysfunction. -Patient's echocardiogram showed mild LV dysfunction with EF of 45%. -Appreciate cardiology input. Cont. Coreg. Hold Lasix given worsening renal function. Follow I's and O's and daily weights.  #5 new-onset atrial fibrillation-rate controlled and continue Coreg, Cardizem. -Given her advanced age and high fall risk she is likely not a long-term anticoagulation candidate.  -Continue aspirin. Appreciate cardiology input  #6  chronic kidney disease stage III-IV-patient likely has chronic kidney disease secondary to diabetes hypertension/age. Current acute increase in creatinine could be cardiorenal given her CHF. - Cr. A bit worse today and therefore Lasix has been stopped. Gentle IV fluids started.  -follow BUN and creatinine and urine output. -Appreciate nephrology consult. Renal US showing no evidence of obstruction but evidence of medical renal disease.   #7 depression-continue Prozac  #8 urinary incontinence-continue oxybutynin.  #9 GERD-continue Protonix.  #10 hypothyroidism-continue Synthroid. Patient's TSH is slightly elevated. We'll increase her Synthroid dose slightly upon discharge  I will get a PT eval.   All the records are reviewed and case discussed with Care Management/Social Workerr. Management plans discussed with the patient, family and they are in agreement.  CODE STATUS: DO NOT RESUSCITATE  DVT Prophylaxis: Heparin subcutaneous  TOTAL TIME TAKING CARE OF THIS PATIENT: 30 minutes.   POSSIBLE D/C IN 2-3 DAYS, DEPENDING ON CLINICAL CONDITION.   Henreitta Leber M.D on 12/10/2014 at 10:35 AM  Between 7am to 6pm - Pager - [phone]  After 6pm go to [email or web address] - password [email or web address] [facility]  [facility]  Office  [phone]  CC: Primary care physician; No primary care provider on file.

## 2014-12-10 NOTE — Progress Notes (Signed)
Pt had become increasingly agitated throughout the day resulting in the self removal of her IV and increasing impulsiveness. Pt attempted to get out of bed disreguarding redirection from the charge nurse and myself. MD paged and since no IV access available and order for 2.'5mg'$  of IM Haldol was ordered. The pt became increasingly agitated, impulsive and combative post IV administration resulting in the pt out of bed swinging the telemetry box at nursing staff so hard that she fell backwards. A Code 300 was called, the MD was informed of fall and another '5mg'$  of IM Haldol was ordered and administered with the assistance of security. Pt still irritable and a sitter has been placed in the room. Unable to assess post fall vitals due to the pts combativeness.

## 2014-12-10 NOTE — Progress Notes (Signed)
   12/10/14 1400  Clinical Encounter Type  Visited With Patient and family together  Visit Type Initial  Referral From Nurse  Consult/Referral To Chaplain  Spiritual Encounters  Spiritual Needs Emotional  Stress Factors  Patient Stress Factors Exhausted;Health changes;Loss of control  Family Stress Factors Exhausted;Family relationships;Major life changes  Observed medical staff activity in patient's room. Family member appeared distressed. Introducing myself, I visited with family member as staff attempted to calm the patient who appeared agitated and combative. Visited with family members and provided compassionate presence until patient calmed.   Chap. Fong Mccarry G. [place]

## 2014-12-10 NOTE — Progress Notes (Signed)
Initial Nutrition Assessment   INTERVENTION:   Meals and Snacks: Cater to patient preferences. RD notes renal diet ordered with fluid restriction. If po intake remains poor, recommend liberalizing diet order. Medical Food Supplement Therapy: will recommend Sugar Free Mighty Shakes BID and Magic Cup BID on meal trays for added nutrition (each shake approximately provides 300kcals and 9g protein)   NUTRITION DIAGNOSIS:   Inadequate oral intake related to acute illness as evidenced by meal completion < 50% this morning.  GOAL:   Patient will meet greater than or equal to 90% of their needs  MONITOR:    (Energy Intake, Electrolyte and Renal Profile, Anthropometrics, Glucose Profile)  REASON FOR ASSESSMENT:   Diagnosis    ASSESSMENT:   Pt admitted with AMS, per MD note with new onset afib, acute renal fialure, acute on chronic CHF and h/o DM. Pt from [place]. Pt also with h/o metastatic breast cancer s/p mastectomy with chemotherapy and radiation.  Past Medical History  Diagnosis Date  . Renal disorder   . Diabetes mellitus without complication ([place])   . Thyroid disease   . Senile dementia   . Hypertension   . Atopic dermatitis   . Osteoarthritis   . Depressive disorder      Diet Order:  Diet renal with fluid restriction Fluid restriction:: 1200 mL Fluid; Room service appropriate?: Yes; Fluid consistency:: Thin    Current Nutrition: Pt asleep on visit; RD also notes AMS on admission with h/o dementia. Per Nsg pt needing assistance with meals. 30% of breakfast recorded this am with 129m fluid. Po intake 50-95% of meals yesterday.  Food/Nutrition-Related History: Unable to clarify. Per MST no decrease in appetite PTA.  Medications: novolog, SS novolog, solumedrol, MVI, vitamin D, NS at 574mhr  Electrolyte/Renal Profile and Glucose Profile:   Recent Labs Lab 12/08/14 1319 12/08/14 2049 12/09/14 0447 12/10/14 0402  NA 132*  --  141 137  K 5.0   --  4.0 4.3  CL 105  --  107 104  CO2 23  --  25 26  BUN 40*  --  43* 47*  CREATININE 2.64* 2.71* 2.80* 3.02*  CALCIUM 7.6*  --  7.8* 8.2*  GLUCOSE 186*  --  124* 192*   Protein Profile: No results for input(s): ALBUMIN in the last 168 hours.  Gastrointestinal Profile: Last BM:  12/09/2014   Nutrition-Focused Physical Exam Findings:  Unable to complete Nutrition-Focused physical exam at this time. RD notes 2+ RUE edema per documented.   Weight Change: Per MST no decrease in weight PTA. RD notes daily weights ordered.   Skin:  Reviewed, no issues  Height:   Ht Readings from Last 1 Encounters:  12/08/14 '5\' 5"'$  (1.651 m)    Weight:   Wt Readings from Last 1 Encounters:  12/10/14 192 lb 12.8 oz (87.454 kg)    Ideal Body Weight:   56.8kg  BMI:  Body mass index is 32.08 kg/(m^2).  Estimated Nutritional Needs:   Kcal:  BEE: 978kcals, TEE: (IF 1.1-1.3)(AF 1.2) 1293-1527kcals, using IBW of 56.8kg  Protein:  57-68g protein (1.0-1.2g/kg) using IBW of 56.8kg  Fluid:  1420-170431mf fluid (25-34m79m) using IBW of 56.8kg, note 1200mL67mid restriction currently  EDUCATION NEEDS:   Education needs no appropriate at this time    MODERElizabeth LDN Pager [phone]

## 2014-12-10 NOTE — Progress Notes (Signed)
Pt combative when touched.  Pt otherwise lethargic due to medication give on day shift.  Unable to give medications safely at this time.  Pt also threw telemetry box off during day shift, unable to replace on patient at this time.  Sitter at bedside.  Pt appears to be resting comfortably.   Lynnda Shields, RN

## 2014-12-10 NOTE — Progress Notes (Signed)
Patient: Paige James / Admit Date: 12/08/2014 / Date of Encounter: 12/10/2014, 9:44 AM   Subjective: Patient still with shortness of breath, wheezing Lasix held for climb in creatinine, now 3. Came into the hospital with creatinine 2.6 (last in our system was March 2015 with creatinine 1.76) Patient pulled out her IV this morning. Lasix held  Review of Systems: ROS Constitutional: Negative.   Gait instability  Respiratory: Positive for cough, shortness of breath and wheezing.  Cardiovascular: Negative.  Gastrointestinal: Negative.  Musculoskeletal: Negative.  Neurological: Negative.  Psychiatric/Behavioral: Negative.  All other systems reviewed and are negative.  Objecti Telemetry:  Physical Exam: Blood pressure 133/71, pulse 71, temperature 97.7 F (36.5 C), temperature source Oral, resp. rate 19, height '5\' 5"'$  (1.651 m), weight 192 lb 12.8 oz (87.454 kg), SpO2 95 %. Body mass index is 32.08 kg/(m^2). General: Well developed, well nourished, in no acute distress. Head: Normocephalic, atraumatic, sclera non-icteric, no xanthomas, nares are without discharge. Neck: Negative for carotid bruits. JVP not elevated. Lungs: Decreased breath sounds throughout, significant wheezing on expiration Heart: RRR S1 S2 without murmurs, rubs, or gallops.  Abdomen: Soft, non-tender, non-distended with normoactive bowel sounds. No rebound/guarding. Extremities: No clubbing or cyanosis. No edema. Distal pedal pulses are 2+ and equal bilaterally. Neuro: Alert and oriented X 3. Moves all extremities spontaneously. Psych:  Responds to questions appropriately with a normal affect.   Intake/Output Summary (Last 24 hours) at 12/10/14 0944 Last data filed at 12/09/14 1800  Gross per 24 hour  Intake      0 ml  Output      0 ml  Net      0 ml    Inpatient Medications:  . aspirin EC  81 mg Oral Daily  . azithromycin  250 mg Oral Daily  . carvedilol  6.25 mg Oral BID  .  cetirizine  10 mg Oral Daily  . diltiazem  30 mg Oral 4 times per day  . FLUoxetine  10 mg Oral Daily  . heparin  5,000 Units Subcutaneous 3 times per day  . insulin aspart  0-5 Units Subcutaneous QHS  . insulin aspart  0-9 Units Subcutaneous TID WC  . levalbuterol  1.25 mg Nebulization Q6H  . levothyroxine  125 mcg Oral QAC breakfast  . multivitamin with minerals  1 tablet Oral Daily  . olopatadine  1 drop Both Eyes Daily  . oxybutynin  10 mg Oral Daily  . pantoprazole  40 mg Oral Daily  . sodium bicarbonate  650 mg Oral BID  . sodium chloride  3 mL Intravenous Q12H  . vitamin A & D  1 application Topical Q M,W,F  . Vitamin D (Ergocalciferol)  50,000 Units Oral Q7 days   Infusions:  . sodium chloride 50 mL/hr at 12/10/14 0855    Labs:  Recent Labs  12/09/14 0447 12/10/14 0402  NA 141 137  K 4.0 4.3  CL 107 104  CO2 25 26  GLUCOSE 124* 192*  BUN 43* 47*  CREATININE 2.80* 3.02*  CALCIUM 7.8* 8.2*   No results for input(s): AST, ALT, ALKPHOS, BILITOT, PROT, ALBUMIN in the last 72 hours.  Recent Labs  12/08/14 1319  12/09/14 0447 12/10/14 0402  WBC 5.1  < > 4.7 4.6  NEUTROABS 3.6  --  2.8  --   HGB 9.1*  < > 8.6* 9.4*  HCT 28.2*  < > 27.0* 29.1*  MCV 99.8  < > 100.1* 100.4*  PLT 193  < >  198 218  < > = values in this interval not displayed.  Recent Labs  12/08/14 1319  TROPONINI 0.03   Invalid input(s): POCBNP  Recent Labs  12/08/14 2049  HGBA1C 6.8*     Weights: Filed Weights   12/10/14 8088  Weight: 192 lb 12.8 oz (87.454 kg)     Radiology/Studies:  Dg Chest 2 View  12/08/2014  CLINICAL DATA: . IMPRESSION: Mild to moderate acute CHF, with stable mild cardiomegaly and mild to moderate diffuse interstitial pulmonary edema. Bilateral pleural effusions and associated passive atelectasis in the lower lobes. Electronically Signed   By: Evangeline Dakin M.D.   On: 12/08/2014 13:37   US Renal  12/09/2014   IMPRESSION: Bilateral echogenic kidneys  consistent with renal medical disease. No obstruction. Electronically Signed   By: Lorriane Shire M.D.   On: 12/09/2014 15:09     Assessment and Plan  81 y.o. female with a history of Metastatic breast cancer status post right-sided mastectomy with chemotherapy and radiation, currently negative for malignancy, history of diabetes, hypertension, hypothyroidism who lives at [facility]. She presents today with 3-4 days of worsening shortness of breath, weakness, giving out. Found to be in atrial fibrillation in the emergency room.  1) respiratory distress, hypoxia  Initially felt to be from CHF. Symptoms have persisted despite diuresis, now looking prerenal with climb in her creatinine This raises the concern for underlying lung disease in the setting of bronchospasm, possible bronchitis Unable to exclude infiltrative lung disease Talked with hospital team, they will consider CT with no contrast  No prior smoking history --Lasix on hold --Patient pulled out her IV. Had been started on normal saline at 50  EF mildly depressed Chest x-ray confirming pulmonary edema, pleural effusions No known coronary artery disease, cardiac enzymes negative Sx possibly exacerbated by new atrial fibrillation  2) atrial fibrillation Possibly persistent. Timing of onset is unclear.  May have had chronic atrial fibrillation for long period time ----will change to diltiazem ER 120 mg daily --Not a good candidate for anticoagulation given her anemia, high fall risk. Family confirms she has had recent fall. Should be walking with a walker, only walks with a cane at home. Lives by herself  3) chronic renal failure, stage IV Worse than 04/2013, creatinine 2.6 to 3 with lasix  4) anemia Likely contributing to her shortness of breath symptoms, tachycardia Unclear if this is iron deficiency, would run a iron panel candidate for EPO?  5) Breast cancer, history of mastectomy  surgery on  the right with lymphedema of the right arm Seen by oncology, by report is in remission  6) pleural effusions Seen on chest x-ray Secondary to CHF/pulmonary HTN  Signed, Esmond Plants, MD [facility] 12/10/2014, 9:44 AM

## 2014-12-10 NOTE — Progress Notes (Signed)
[facility]  ROUNDING NOTE   Subjective:  Cr actually higher today. We took pt off diuretics and started on IVFs earlier in the day. Renal US shows small kidneys consistent with CKD.  Still pleasantly confused.     Objective:  Vital signs in last 24 hours:  Temp:  [97.7 F (36.5 C)-98.6 F (37 C)] 97.7 F (36.5 C) (10/13 0653) Pulse Rate:  [41-73] 71 (10/13 0833) Resp:  [19-23] 19 (10/13 0833) BP: (122-153)/(71-122) 133/71 mmHg (10/13 0833) SpO2:  [94 %-100 %] 95 % (10/13 0833) FiO2 (%):  [36 %] 36 % (10/13 0751) Weight:  [87.454 kg (192 lb 12.8 oz)] 87.454 kg (192 lb 12.8 oz) (10/13 0653)  Weight change:  Filed Weights   12/10/14 0653  Weight: 87.454 kg (192 lb 12.8 oz)    Intake/Output: I/O last 3 completed shifts: In: 120 [P.O.:120] Out: 550 [Urine:550]   Intake/Output this shift:  Total I/O In: 120 [P.O.:120] Out: 300 [Urine:300]  Physical Exam: General: NAD  Head: Normocephalic, atraumatic. Moist oral mucosal membranes  Eyes: Anicteric  Neck: Supple, trachea midline  Lungs:  Bilateral wheezing normal effort  Heart: Regular rate and rhythm  Abdomen:  Soft, nontender, BS present  Extremities:  trace peripheral edema.  Neurologic: Nonfocal, moving all four extremities  Skin: No lesions       Basic Metabolic Panel:  Recent Labs Lab 12/08/14 1319 12/08/14 2049 12/09/14 0447 12/10/14 0402  NA 132*  --  141 137  K 5.0  --  4.0 4.3  CL 105  --  107 104  CO2 23  --  25 26  GLUCOSE 186*  --  124* 192*  BUN 40*  --  43* 47*  CREATININE 2.64* 2.71* 2.80* 3.02*  CALCIUM 7.6*  --  7.8* 8.2*    Liver Function Tests: No results for input(s): AST, ALT, ALKPHOS, BILITOT, PROT, ALBUMIN in the last 168 hours. No results for input(s): LIPASE, AMYLASE in the last 168 hours. No results for input(s): AMMONIA in the last 168 hours.  CBC:  Recent Labs Lab 12/08/14 1319 12/08/14 2049 12/09/14 0447 12/10/14 0402  WBC 5.1 5.1 4.7 4.6   NEUTROABS 3.6  --  2.8  --   HGB 9.1* 9.5* 8.6* 9.4*  HCT 28.2* 28.6* 27.0* 29.1*  MCV 99.8 99.6 100.1* 100.4*  PLT 193 194 198 218    Cardiac Enzymes:  Recent Labs Lab 12/08/14 1319  TROPONINI 0.03    BNP: Invalid input(s): POCBNP  CBG:  Recent Labs Lab 12/09/14 1149 12/09/14 1654 12/09/14 2137 12/10/14 0823 12/10/14 1104  GLUCAP 106* 142* 166* 169* 129*    Microbiology: No results found for this or any previous visit.  Coagulation Studies: No results for input(s): LABPROT, INR in the last 72 hours.  Urinalysis: No results for input(s): COLORURINE, LABSPEC, PHURINE, GLUCOSEU, HGBUR, BILIRUBINUR, KETONESUR, PROTEINUR, UROBILINOGEN, NITRITE, LEUKOCYTESUR in the last 72 hours.  Invalid input(s): APPERANCEUR    Imaging: Ct Chest Wo Contrast  12/10/2014  CLINICAL DATA:  Congestive heart failure and renal failure. EXAM: CT CHEST WITHOUT CONTRAST TECHNIQUE: Multidetector CT imaging of the chest was performed following the standard protocol without IV contrast. COMPARISON:  CT 06/03/2013 FINDINGS: Mediastinum/Nodes: Postsurgical change in the RIGHT axilla of lymphadenectomy and RIGHT mastectomy . No supraclavicular or mediastinal adenopathy. No pericardial fluid. Coronary calcifications are present. The ascending aorta measures intimal calcification. No significant aneurysm. Esophagus is normal. Lungs/Pleura: There is a moderate layering RIGHT pleural effusion with associated RIGHT basilar passive atelectasis.  This RIGHT effusion is increased from comparison exam. There is nodular pleural-parenchymal thickening at the RIGHT lung apex (image 9, series 3) which is similar to comparison exam. Small LEFT effusion is present. Upper abdomen: Limited view of the liver, kidneys, pancreas are unremarkable. Normal adrenal glands. Musculoskeletal: No aggressive osseous lesion. There is mild anasarca of the soft tissues of the thorax. IMPRESSION: 1. Bilateral pleural effusions, moderate  on the RIGHT and small on the LEFT with associated bibasilar atelectasis. 2. Stable nodule corporal parenchymal thickening at the RIGHT lung apex. 3. No airspace disease evident. Electronically Signed   By: Suzy Bouchard M.D.   On: 12/10/2014 12:23   US Renal  12/09/2014  CLINICAL DATA:  Chronic renal failure. EXAM: RENAL / URINARY TRACT ULTRASOUND COMPLETE COMPARISON:  PET-CT dated 06/12/2011 and abdominal ultrasound dated 08/25/2011 FINDINGS: Right Kidney: Length: 8.0 cm. Increased echogenicity of the renal parenchyma. No hydronephrosis or mass lesion. Left Kidney: Length: 8.4 cm. Increased echogenicity of the renal parenchyma. No hydronephrosis or mass lesion. Bladder: Appears normal for degree of bladder distention. IMPRESSION: Bilateral echogenic kidneys consistent with renal medical disease. No obstruction. Electronically Signed   By: Lorriane Shire M.D.   On: 12/09/2014 15:09     Medications:   . sodium chloride 50 mL/hr at 12/10/14 0855   . aspirin EC  81 mg Oral Daily  . azithromycin  250 mg Oral Daily  . carvedilol  6.25 mg Oral BID  . cetirizine  10 mg Oral Daily  . diltiazem  30 mg Oral Q12H  . FLUoxetine  10 mg Oral Daily  . heparin  5,000 Units Subcutaneous 3 times per day  . insulin aspart  0-5 Units Subcutaneous QHS  . insulin aspart  0-9 Units Subcutaneous TID WC  . levalbuterol  1.25 mg Nebulization Q6H  . levothyroxine  125 mcg Oral QAC breakfast  . methylPREDNISolone (SOLU-MEDROL) injection  40 mg Intravenous Q12H  . multivitamin with minerals  1 tablet Oral Daily  . olopatadine  1 drop Both Eyes Daily  . oxybutynin  10 mg Oral Daily  . pantoprazole  40 mg Oral Daily  . sodium bicarbonate  650 mg Oral BID  . sodium chloride  3 mL Intravenous Q12H  . vitamin A & D  1 application Topical Q M,W,F  . Vitamin D (Ergocalciferol)  50,000 Units Oral Q7 days   sodium chloride, acetaminophen, haloperidol lactate, ondansetron (ZOFRAN) IV, sodium chloride  Assessment/  Plan:  81 y.o. female  with a PMHX of chronic kidney disease stage IV baseline EGFR 29 and creatinine of 1.7, diabetes mellitus type 2, dementia, hypertension, osteophytes, depression, who was admitted to [facility] on 12/08/2014 for evaluation of altered mental status.   1. Acute renal failure/CKD stage IV baseline Cr 1.7 egfr 29. Acute renal failure likely secondary to altered cardiorenal hemodynamics. Chronic kidney disease is likely multifactorial with contributions from diabetes mellitus, hypertension, and age. Renal US shows small kidneys. - Renal function worse, diuretics stopped, started on gentle hydration with 0.9NS at 50cc/hr, follow up renal function in AM. No indication for HD at this time.   2. Acute diastolic heart failure. Ejection fraction was found to be 45-50% however right-sided pressures were elevated.  -renal function worse with diuretics, lasix stopped, actually started on gentle IVF hydration.  3. Anemia of CKD: hgb up to 9.4, no urgent indication to start procrit.   LOS: 2 Corinthia Helmers 10/13/20164:58 PM

## 2014-12-11 LAB — BASIC METABOLIC PANEL
Anion gap: 8 (ref 5–15)
BUN: 45 mg/dL — ABNORMAL HIGH (ref 6–20)
CHLORIDE: 109 mmol/L (ref 101–111)
CO2: 24 mmol/L (ref 22–32)
CREATININE: 2.74 mg/dL — AB (ref 0.44–1.00)
Calcium: 8.2 mg/dL — ABNORMAL LOW (ref 8.9–10.3)
GFR calc non Af Amer: 14 mL/min — ABNORMAL LOW (ref >60)
GFR, EST AFRICAN AMERICAN: 16 mL/min — AB (ref >60)
GLUCOSE: 95 mg/dL (ref 65–99)
Potassium: 4 mmol/L (ref 3.5–5.1)
Sodium: 141 mmol/L (ref 135–145)

## 2014-12-11 LAB — GLUCOSE, CAPILLARY
Glucose-Capillary: 91 mg/dL (ref 65–99)
Glucose-Capillary: 292 mg/dL — ABNORMAL HIGH (ref 65–99)
Glucose-Capillary: 219 mg/dL — ABNORMAL HIGH (ref 65–99)

## 2014-12-11 MED ORDER — LEVALBUTEROL HCL 1.25 MG/0.5ML IN NEBU
1.2500 mg | INHALATION_SOLUTION | Freq: Four times a day (QID) | RESPIRATORY_TRACT | Status: DC | PRN
  Administered 2014-12-11: 1.25 mg via RESPIRATORY_TRACT
  Filled 2014-12-11: qty 0.5

## 2014-12-11 MED ORDER — METHYLPREDNISOLONE SODIUM SUCC 40 MG IJ SOLR
40.0000 mg | Freq: Every day | INTRAMUSCULAR | Status: DC
  Administered 2014-12-12 – 2014-12-15 (×4): 40 mg via INTRAVENOUS
  Filled 2014-12-11 (×4): qty 1

## 2014-12-11 NOTE — Progress Notes (Signed)
Per family request. Patient would like to move forward with the thoracentesis. Dr. Ashby Dawes has been notified.

## 2014-12-11 NOTE — Progress Notes (Signed)
[place] at [place] NAME: Paige James    MR#:  [id number]  DATE OF BIRTH:  June 12, 1920  SUBJECTIVE:  CHIEF COMPLAINT:   Chief Complaint  Patient presents with  . Altered Mental Status   Pt. Was a bit confused yesterday and got some Haldol but a bit better today. Sitter at bedside  Granddaughter at bedside. Still has some wheezing but mostly upper airway.    REVIEW OF SYSTEMS:    Review of Systems  Constitutional: Negative for fever and chills.  HENT: Negative for congestion and tinnitus.   Eyes: Negative for blurred vision and double vision.  Respiratory: Positive for wheezing. Negative for cough and shortness of breath.   Cardiovascular: Negative for chest pain, orthopnea and PND.  Gastrointestinal: Negative for nausea, vomiting, abdominal pain and diarrhea.  Genitourinary: Negative for dysuria and hematuria.  Neurological: Positive for weakness (generalized). Negative for dizziness, sensory change and focal weakness.  All other systems reviewed and are negative.   Nutrition: Renal heart healthy Tolerating Diet: Yes Tolerating PT: Yes   DRUG ALLERGIES:  No Known Allergies  VITALS:  Blood pressure 164/94, pulse 72, temperature 98.3 F (36.8 C), temperature source Oral, resp. rate 20, height '5\' 5"'$  (1.651 m), weight 86.274 kg (190 lb 3.2 oz), SpO2 97 %.  PHYSICAL EXAMINATION:   Physical Exam  GENERAL:  81 y.o.-year-old patient lying in the bed in no acute distress.  EYES: Pupils equal, round, reactive to light and accommodation. No scleral icterus. Extraocular muscles intact.  HEENT: Head atraumatic, normocephalic. Oropharynx and nasopharynx clear.  NECK:  Supple, no jugular venous distention. No thyroid enlargement, no tenderness.  LUNGS: No evidence of accessory muscles. Upper Airway wheezing b/l. No rales, rhonchi. Good air entry bilaterally. No rhonchi, rales CARDIOVASCULAR: S1, S2 irregular. No murmurs, rubs, or  gallops.  ABDOMEN: Soft, nontender, nondistended. Bowel sounds present. No organomegaly or mass.  EXTREMITIES: No cyanosis, clubbing or edema b/l.    NEUROLOGIC: Cranial nerves II through XII are intact. No focal Motor or sensory deficits b/l.   PSYCHIATRIC: The patient is alert and oriented x 1.  SKIN: No obvious rash, lesion, or ulcer.    LABORATORY PANEL:   CBC  Recent Labs Lab 12/10/14 0402  WBC 4.6  HGB 9.4*  HCT 29.1*  PLT 218   ------------------------------------------------------------------------------------------------------------------  Chemistries   Recent Labs Lab 12/11/14 0626  NA 141  K 4.0  CL 109  CO2 24  GLUCOSE 95  BUN 45*  CREATININE 2.74*  CALCIUM 8.2*   ------------------------------------------------------------------------------------------------------------------  Cardiac Enzymes  Recent Labs Lab 12/08/14 1319  TROPONINI 0.03   ------------------------------------------------------------------------------------------------------------------  RADIOLOGY:  Ct Chest Wo Contrast  12/10/2014  CLINICAL DATA:  Congestive heart failure and renal failure. EXAM: CT CHEST WITHOUT CONTRAST TECHNIQUE: Multidetector CT imaging of the chest was performed following the standard protocol without IV contrast. COMPARISON:  CT 06/03/2013 FINDINGS: Mediastinum/Nodes: Postsurgical change in the RIGHT axilla of lymphadenectomy and RIGHT mastectomy . No supraclavicular or mediastinal adenopathy. No pericardial fluid. Coronary calcifications are present. The ascending aorta measures intimal calcification. No significant aneurysm. Esophagus is normal. Lungs/Pleura: There is a moderate layering RIGHT pleural effusion with associated RIGHT basilar passive atelectasis. This RIGHT effusion is increased from comparison exam. There is nodular pleural-parenchymal thickening at the RIGHT lung apex (image 9, series 3) which is similar to comparison exam. Small LEFT effusion is  present. Upper abdomen: Limited view of the liver, kidneys, pancreas are unremarkable. Normal adrenal glands.  Musculoskeletal: No aggressive osseous lesion. There is mild anasarca of the soft tissues of the thorax. IMPRESSION: 1. Bilateral pleural effusions, moderate on the RIGHT and small on the LEFT with associated bibasilar atelectasis. 2. Stable nodule corporal parenchymal thickening at the RIGHT lung apex. 3. No airspace disease evident. Electronically Signed   By: Suzy Bouchard M.D.   On: 12/10/2014 12:23     ASSESSMENT AND PLAN:   81 year old female with past medical history of chronic kidney disease stage III, hypertension, type 2 diabetes without, condition, dementia, osteoarthritis, hypothyroidism who presented to the hospital due to shortness of breath and also mental status.  #1 altered mental status-this is likely multifactorial and related to underlying dementia with sundowning and also complicated with underlying renal failure. -Mental status is a bit improved since yesterday. No agitation today. Patient got some Haldol yesterday. Avoid benzodiazepines. -Continue to follow mental status  #2 acute respiratory failure with hypoxia-likely secondary to mild CHF/underlying acute bronchitis. -CT chest yesterday showing a moderate right-sided right pleural effusion and a small left pleural effusion and a small pulmonary nodule. We will get pulmonary consult to see if this pleural effusion on the right needs to be tapped.   -Hold Lasix given renal failure. Continue steroids but will taper, finished Z-Pak.  #3 acute bronchitis-continue Xopenex nebs, Zithromax. -Add IV steroids but will taper, CT chest yesterday showing no airway disease but evidence of bilateral pleural effusions more on the right than the left. Pulmonary consult pending  #4 CHF-acute on chronic systolic dysfunction. -Patient's echocardiogram showed mild LV dysfunction with EF of 45%. -Appreciate cardiology input. Cont.  Coreg. Holding Lasix given worsening renal function.   #5 new-onset atrial fibrillation-rate controlled and continue Coreg, Cardizem. -Given her advanced age and high fall risk she is likely not a long-term anticoagulation candidate.  -Continue aspirin. Appreciate cardiology input  #6 chronic kidney disease stage III-IV-patient likely has chronic kidney disease secondary to diabetes hypertension/age. Current acute increase in creatinine could be cardiorenal given her CHF. - Cr. A bit worse yesterday and therefore Lasix has been stopped. G -follow BUN and creatinine and urine output. -Appreciate nephrology consult. Renal US showing no evidence of obstruction but evidence of medical renal disease.   #7 depression-continue Prozac  #8 urinary incontinence-continue oxybutynin.  #9 GERD-continue Protonix.  #10 hypothyroidism-continue Synthroid. Patient's TSH is slightly elevated. We'll increase her Synthroid dose slightly upon discharge  Await PT Eval.    All the records are reviewed and case discussed with Care Management/Social Workerr. Management plans discussed with the patient, family and they are in agreement.  CODE STATUS: DO NOT RESUSCITATE  DVT Prophylaxis: Heparin subcutaneous  TOTAL TIME TAKING CARE OF THIS PATIENT: 35 minutes.   POSSIBLE D/C IN 2-3 DAYS, DEPENDING ON CLINICAL CONDITION.  Greater than 50% of time spent in coordination of care discussion with the patient's family, pulmonologist.  Henreitta Leber M.D on 12/11/2014 at 3:26 PM  Between 7am to 6pm - Pager - [phone]  After 6pm go to [email or web address] - password [email or web address] [facility]  [facility]  Office  [phone]  CC: Primary care physician; No primary care provider on file.

## 2014-12-11 NOTE — Progress Notes (Signed)
[facility]  ROUNDING NOTE   Subjective:  Was agitated last night. Pulled out IV.  Was given haldol and ativan.  Renal function improved.  Cr down to 2.74.   Objective:  Vital signs in last 24 hours:  Temp:  [98.3 F (36.8 C)] 98.3 F (36.8 C) (10/14 0558) Pulse Rate:  [70-88] 74 (10/14 0933) Resp:  [16] 16 (10/14 0558) BP: (101-152)/(70-110) 152/110 mmHg (10/14 0933) SpO2:  [90 %-98 %] 90 % (10/14 0558) Weight:  [86.274 kg (190 lb 3.2 oz)] 86.274 kg (190 lb 3.2 oz) (10/14 0558)  Weight change: -1.179 kg (-2 lb 9.6 oz) Filed Weights   12/10/14 0653 12/11/14 0558  Weight: 87.454 kg (192 lb 12.8 oz) 86.274 kg (190 lb 3.2 oz)    Intake/Output: I/O last 3 completed shifts: In: 120 [P.O.:120] Out: 300 [Urine:300]   Intake/Output this shift:  Total I/O In: 240 [P.O.:240] Out: 300 [Urine:300]  Physical Exam: General: NAD  Head: Normocephalic, atraumatic. Moist oral mucosal membranes  Eyes: Anicteric  Neck: Supple, trachea midline  Lungs:  Bilateral wheezing normal effort  Heart: Regular rate and rhythm  Abdomen:  Soft, nontender, BS present  Extremities:  trace peripheral edema.  Neurologic: Nonfocal, moving all four extremities  Skin: No lesions       Basic Metabolic Panel:  Recent Labs Lab 12/08/14 1319 12/08/14 2049 12/09/14 0447 12/10/14 0402 12/11/14 0626  NA 132*  --  141 137 141  K 5.0  --  4.0 4.3 4.0  CL 105  --  107 104 109  CO2 23  --  25 26 24   GLUCOSE 186*  --  124* 192* 95  BUN 40*  --  43* 47* 45*  CREATININE 2.64* 2.71* 2.80* 3.02* 2.74*  CALCIUM 7.6*  --  7.8* 8.2* 8.2*    Liver Function Tests: No results for input(s): AST, ALT, ALKPHOS, BILITOT, PROT, ALBUMIN in the last 168 hours. No results for input(s): LIPASE, AMYLASE in the last 168 hours. No results for input(s): AMMONIA in the last 168 hours.  CBC:  Recent Labs Lab 12/08/14 1319 12/08/14 2049 12/09/14 0447 12/10/14 0402  WBC 5.1 5.1 4.7 4.6  NEUTROABS  3.6  --  2.8  --   HGB 9.1* 9.5* 8.6* 9.4*  HCT 28.2* 28.6* 27.0* 29.1*  MCV 99.8 99.6 100.1* 100.4*  PLT 193 194 198 218    Cardiac Enzymes:  Recent Labs Lab 12/08/14 1319  TROPONINI 0.03    BNP: Invalid input(s): POCBNP  CBG:  Recent Labs Lab 12/09/14 2137 12/10/14 0823 12/10/14 1104 12/10/14 2039 12/11/14 0821  GLUCAP 166* 169* 129* 102* 91    Microbiology: No results found for this or any previous visit.  Coagulation Studies: No results for input(s): LABPROT, INR in the last 72 hours.  Urinalysis: No results for input(s): COLORURINE, LABSPEC, PHURINE, GLUCOSEU, HGBUR, BILIRUBINUR, KETONESUR, PROTEINUR, UROBILINOGEN, NITRITE, LEUKOCYTESUR in the last 72 hours.  Invalid input(s): APPERANCEUR    Imaging: Ct Chest Wo Contrast  12/10/2014  CLINICAL DATA:  Congestive heart failure and renal failure. EXAM: CT CHEST WITHOUT CONTRAST TECHNIQUE: Multidetector CT imaging of the chest was performed following the standard protocol without IV contrast. COMPARISON:  CT 06/03/2013 FINDINGS: Mediastinum/Nodes: Postsurgical change in the RIGHT axilla of lymphadenectomy and RIGHT mastectomy . No supraclavicular or mediastinal adenopathy. No pericardial fluid. Coronary calcifications are present. The ascending aorta measures intimal calcification. No significant aneurysm. Esophagus is normal. Lungs/Pleura: There is a moderate layering RIGHT pleural effusion with associated RIGHT basilar  passive atelectasis. This RIGHT effusion is increased from comparison exam. There is nodular pleural-parenchymal thickening at the RIGHT lung apex (image 9, series 3) which is similar to comparison exam. Small LEFT effusion is present. Upper abdomen: Limited view of the liver, kidneys, pancreas are unremarkable. Normal adrenal glands. Musculoskeletal: No aggressive osseous lesion. There is mild anasarca of the soft tissues of the thorax. IMPRESSION: 1. Bilateral pleural effusions, moderate on the RIGHT  and small on the LEFT with associated bibasilar atelectasis. 2. Stable nodule corporal parenchymal thickening at the RIGHT lung apex. 3. No airspace disease evident. Electronically Signed   By: Suzy Bouchard M.D.   On: 12/10/2014 12:23   US Renal  12/09/2014  CLINICAL DATA:  Chronic renal failure. EXAM: RENAL / URINARY TRACT ULTRASOUND COMPLETE COMPARISON:  PET-CT dated 06/12/2011 and abdominal ultrasound dated 08/25/2011 FINDINGS: Right Kidney: Length: 8.0 cm. Increased echogenicity of the renal parenchyma. No hydronephrosis or mass lesion. Left Kidney: Length: 8.4 cm. Increased echogenicity of the renal parenchyma. No hydronephrosis or mass lesion. Bladder: Appears normal for degree of bladder distention. IMPRESSION: Bilateral echogenic kidneys consistent with renal medical disease. No obstruction. Electronically Signed   By: Lorriane Shire M.D.   On: 12/09/2014 15:09     Medications:   . sodium chloride 50 mL/hr at 12/11/14 1132   . aspirin EC  81 mg Oral Daily  . azithromycin  250 mg Oral Daily  . carvedilol  6.25 mg Oral BID  . cetirizine  10 mg Oral Daily  . diltiazem  30 mg Oral Q12H  . FLUoxetine  10 mg Oral Daily  . heparin  5,000 Units Subcutaneous 3 times per day  . insulin aspart  0-5 Units Subcutaneous QHS  . insulin aspart  0-9 Units Subcutaneous TID WC  . levothyroxine  125 mcg Oral QAC breakfast  . methylPREDNISolone (SOLU-MEDROL) injection  40 mg Intravenous Q12H  . multivitamin with minerals  1 tablet Oral Daily  . olopatadine  1 drop Both Eyes Daily  . oxybutynin  10 mg Oral Daily  . pantoprazole  40 mg Oral Daily  . sodium bicarbonate  650 mg Oral BID  . sodium chloride  3 mL Intravenous Q12H  . vitamin A & D  1 application Topical Q M,W,F  . Vitamin D (Ergocalciferol)  50,000 Units Oral Q7 days   sodium chloride, acetaminophen, haloperidol lactate, levalbuterol, ondansetron (ZOFRAN) IV, sodium chloride  Assessment/ Plan:  81 y.o. female  with a PMHX of  chronic kidney disease stage IV baseline EGFR 29 and creatinine of 1.7, diabetes mellitus type 2, dementia, hypertension, osteophytes, depression, who was admitted to [facility] on 12/08/2014 for evaluation of altered mental status.   1. Acute renal failure/CKD stage IV baseline Cr 1.7 egfr 29. Acute renal failure likely secondary to altered cardiorenal hemodynamics. Chronic kidney disease is likely multifactorial with contributions from diabetes mellitus, hypertension, and age. Renal US shows small kidneys. - Renal function has improved, but pt pulled out IV, instructed nursing to replace this and restart IVFs.  Follow Cr trend for now.  2. Acute diastolic heart failure. Ejection fraction was found to be 45-50% however right-sided pressures were elevated.  -renal function improved off of diuretics, will follow respiratory status to make sure shes not getting worse with IVF hydration.  3. Anemia of CKD: hgb 9.4 yesterday, no acute indication for procrit, but will need to have CBC monitored as outpt.   LOS: 3 Isao Seltzer 10/14/201611:42 AM

## 2014-12-11 NOTE — Progress Notes (Signed)
  Inpatient Diabetes Program Recommendations  AACE/ADA: New Consensus Statement on Inpatient Glycemic Control (2015)  Target Ranges:  Prepandial:   less than 140 mg/dL      Peak postprandial:   less than 180 mg/dL (1-2 hours)      Critically ill patients:  140 - 180 mg/dL    Results for ADAJAH, COCKING (MRN [id number]) as of 12/11/2014 10:11  Ref. Range 12/09/2014 21:37 12/10/2014 08:23 12/10/2014 11:04 12/10/2014 20:39 12/11/2014 08:21  Glucose-Capillary Latest Ref Range: 65-99 mg/dL 166 (H) 169 (H) 129 (H) 102 (H) 91   Review of Glycemic Control  Diabetes history: DM2 Outpatient Diabetes medications: Glipizide 5 mg BID Current orders for Inpatient glycemic control: Novolog 0-9 units TID with meals, Novolog 0-5 units HS  Inpatient Diabetes Program Recommendations:   A1C 6.8% -ideal at home but given her age, consider stopping her orders for Novolog insulin to avoid hypoglycemia.    Gentry Fitz, RN, BA, MHA, CDE Diabetes Coordinator Inpatient Diabetes Program  [phone] (Team Pager) [phone] ([place]) 12/11/2014 10:14 AM

## 2014-12-11 NOTE — Patient Outreach (Signed)
[facility]) Care Management  12/11/2014  DEA BITTING 1920-06-12 [id number]   Referral from Gentry Fitz, RN, notification sent to Joylene Draft, RN to outreach for [place] Management services.  Thanks, Ronnell Freshwater. [place], [place] Assistant Phone: [phone] Fax: [phone]

## 2014-12-11 NOTE — Care Management (Signed)
Pulmonary consult.  Possible lung malignancy.  Do not anticipate further work up- such as biopsy due to comorbidities.  Patient may benefit from therapeutic thoracentesis for respiratory difficulty due to pleural effusion.  It is anticipated that patient  To discharge to an assisted living with home health.  Patient  had code 300 called 10/13  and has required a sitter over the last 24 hours due to agitation requiring requiring haldol injections.  Physical therapy did not attempt to treat yesterday due to the agitation

## 2014-12-11 NOTE — Progress Notes (Signed)
Pt will not wear nebulizer mask. RT held nebulizer treatment in an attempt to give pt her treatment. Pt covers her head and will not participate in treatment.

## 2014-12-11 NOTE — Consult Note (Signed)
[facility] Consultation      Assessment and Plan:  Acute hypoxic respiratory failure with right-sided pleural effusion. -I had a long discussion with patient's daughter and grandson at the bedside today. I discussed the risks and benefits of a thoracentesis, as well as the possibilities of various different diagnoses, which would cause both the effusion and the lung nodule. Given her respiratory difficulty. She may benefit symptomatically from a thoracentesis though I explained that the effusion, if it is from congestive heart failure, is likely to return. Patient's daughter is going to discuss this with the rest of her family and see if they would like to proceed. As it does not appear to be urgent, this could be done in the next few days or even on an outpatient basis. -The patient does have some respiratory difficulty, and would likely benefit from a therapeutic thoracentesis, I also explained that we could send this for usual testing to look for evidence of cancer.  Right upper lobe lung nodule. -This appears to be stable, to minimally enlarged from previous CAT scan from 06/03/2013. Likelihood is that this represents granulomatous disease or a very slowly growing indolent lung cancer. -Discussed with the family, given her comorbidities and advanced age and poor functional status, would not recommend biopsy at this time, and they were in agreement.  Acute systolic congestive heart failure. -Echocardiogram showed ejection fraction of 45-50%, diuresis has been limited by renal function. -Discussed possible thoracentesis with family. They're going to consider this.  -Doubt significant infectious component, or bronchoconstriction. Can wean down steroids, no need for antibiotics for respiratory standpoint.  Date: 12/11/2014  MRN# [id number] RAESHA COONROD [birth year]  Referring Physician: Dr. Verdell Carmine.   MIKALIA FESSEL is a 81 y.o. old female seen in consultation for chief  complaint of:    Chief Complaint  Patient presents with  . Altered Mental Status    HPI:  The patient is a 81 year old Afro-American female who was admitted to the hospital with confusion and respiratory difficulty. The patient is awake and alert but not very verbal, therefore, all history obtained from the chart and from the patient's daughter and grandson who are at the bedside. The patient lives in an assisted living facility. He is inhibitory though she is topically stable. Apparently when she walks her by mouth intake has been decreasing over the last few weeks, she has a history of metastatic breast cancer, status post mastectomy, chemotherapy and radiation, she has been in remission for several years. Currently the patient is awake, however, she does appear to have some respiratory difficulty.  She has been treated in the hospital with a course of steroids for possible COPD exacerbation, she was also started on a course of azithromycin. The patient was given diuresis to try to reduce the size of her fusion. However, dosing was limited because of development of acute kidney injury.   CT of the chest images and reports from 12/10/2014 and 06/03/2013 were reviewed. There is a small nodule in the right upper lobe, this appears to be slightly to not enlarged compared to previous CAT scan, this could represent an indolent cancer versus granulomatous disease which appears to be more likely. There is also a moderate right-sided pleural effusion and a small left-sided pleural effusion, which were not seen on previous imaging. The effusions do not appear to be loculated.   Echocardiogram results from 12/08/2014 reviewed. -Ejection fraction was 45-50%. -Pulmonary systolic pressure was 62-56 mmHg. -Atrial fibrillation.  PMHX:  Past Medical History  Diagnosis Date  . Renal disorder   . Diabetes mellitus without complication ([place])   . Thyroid disease   . Senile dementia   . Hypertension   .  Atopic dermatitis   . Osteoarthritis   . Depressive disorder    Surgical Hx:  History reviewed. No pertinent past surgical history. Family Hx:  No family history on file. Social Hx:   Social History  Substance Use Topics  . Smoking status: Never Smoker   . Smokeless tobacco: None  . Alcohol Use: None   Medication:   No current outpatient prescriptions on file.    Allergies:  Review of patient's allergies indicates no known allergies.  Review of Systems: Gen:  Denies  fever, sweats, chills HEENT: Denies blurred vision, double vision. Cvc:  No dizziness, chest pain. Resp:   Denies cough or sputum porduction,  Gi: Denies swallowing difficulty, Gu:  Denies bladder incontinence, Ext:   No Joint pain, stiffness. Skin: No skin rash,  hives Endoc:  No polyuria, polydipsia. Psych: No depression, insomnia. Other:  All other systems were reviewed with the patient and were negative other that what is mentioned in the HPI.   Physical Examination:   VS: BP 164/94 mmHg  Pulse 72  Temp(Src) 98.3 F (36.8 C) (Oral)  Resp 20  Ht '5\' 5"'$  (1.651 m)  Wt 190 lb 3.2 oz (86.274 kg)  BMI 31.65 kg/m2  SpO2 92%  General Appearance: No distress  Neuro:without focal findings,  speech normal,  HEENT: PERRLA, EOM intact.   Pulmonary: normal breath sounds, No wheezing.  CardiovascularNormal S1,S2.  No m/r/g.   Abdomen: Benign, Soft, non-tender. Renal:  No costovertebral tenderness  GU:  No performed at this time. Endoc: No evident thyromegaly, no signs of acromegaly. Skin:   warm, no rashes, no ecchymosis  Extremities: normal, no cyanosis, clubbing.  Other findings:    LABORATORY PANEL:   CBC  Recent Labs Lab 12/10/14 0402  WBC 4.6  HGB 9.4*  HCT 29.1*  PLT 218   ------------------------------------------------------------------------------------------------------------------  Chemistries   Recent Labs Lab 12/11/14 0626  NA 141  K 4.0  CL 109  CO2 24  GLUCOSE 95    BUN 45*  CREATININE 2.74*  CALCIUM 8.2*   ------------------------------------------------------------------------------------------------------------------  Cardiac Enzymes  Recent Labs Lab 12/08/14 1319  TROPONINI 0.03   ------------------------------------------------------------  RADIOLOGY:  Ct Chest Wo Contrast  12/10/2014  CLINICAL DATA:  Congestive heart failure and renal failure. EXAM: CT CHEST WITHOUT CONTRAST TECHNIQUE: Multidetector CT imaging of the chest was performed following the standard protocol without IV contrast. COMPARISON:  CT 06/03/2013 FINDINGS: Mediastinum/Nodes: Postsurgical change in the RIGHT axilla of lymphadenectomy and RIGHT mastectomy . No supraclavicular or mediastinal adenopathy. No pericardial fluid. Coronary calcifications are present. The ascending aorta measures intimal calcification. No significant aneurysm. Esophagus is normal. Lungs/Pleura: There is a moderate layering RIGHT pleural effusion with associated RIGHT basilar passive atelectasis. This RIGHT effusion is increased from comparison exam. There is nodular pleural-parenchymal thickening at the RIGHT lung apex (image 9, series 3) which is similar to comparison exam. Small LEFT effusion is present. Upper abdomen: Limited view of the liver, kidneys, pancreas are unremarkable. Normal adrenal glands. Musculoskeletal: No aggressive osseous lesion. There is mild anasarca of the soft tissues of the thorax. IMPRESSION: 1. Bilateral pleural effusions, moderate on the RIGHT and small on the LEFT with associated bibasilar atelectasis. 2. Stable nodule corporal parenchymal thickening at the RIGHT lung apex. 3. No airspace disease evident. Electronically Signed  By: Suzy Bouchard M.D.   On: 12/10/2014 12:23   US Renal  12/09/2014  CLINICAL DATA:  Chronic renal failure. EXAM: RENAL / URINARY TRACT ULTRASOUND COMPLETE COMPARISON:  PET-CT dated 06/12/2011 and abdominal ultrasound dated 08/25/2011 FINDINGS:  Right Kidney: Length: 8.0 cm. Increased echogenicity of the renal parenchyma. No hydronephrosis or mass lesion. Left Kidney: Length: 8.4 cm. Increased echogenicity of the renal parenchyma. No hydronephrosis or mass lesion. Bladder: Appears normal for degree of bladder distention. IMPRESSION: Bilateral echogenic kidneys consistent with renal medical disease. No obstruction. Electronically Signed   By: Lorriane Shire M.D.   On: 12/09/2014 15:09       Thank  you for the consultation and for allowing [facility] to assist in the care of your patient. Our recommendations are noted above.  Please contact us if we can be of further service.   [name], MD.  Board Certified in Internal Medicine, Pulmonary Medicine, [place], and Sleep Medicine.  [facility] Office Number: [phone]  Patricia Pesa, M.D.  Vilinda Boehringer, M.D.  Merton Border, M.D

## 2014-12-11 NOTE — Progress Notes (Signed)
Pt has been resting quietly today. No signs of agitation or impulsiveness. Family currently at bedside. No complaints at this time.

## 2014-12-11 NOTE — Care Management Important Message (Signed)
Important Message  Patient Details  Name: Paige James MRN: [id number] Date of Birth: 1920/11/07   Medicare Important Message Given:  Yes-third notification given    Darius Bump Allmond 12/11/2014, 2:08 PM

## 2014-12-12 LAB — GLUCOSE, CAPILLARY
GLUCOSE-CAPILLARY: 237 mg/dL — AB (ref 65–99)
GLUCOSE-CAPILLARY: 160 mg/dL — AB (ref 65–99)
Glucose-Capillary: 119 mg/dL — ABNORMAL HIGH (ref 65–99)
Glucose-Capillary: 150 mg/dL — ABNORMAL HIGH (ref 65–99)
Glucose-Capillary: 197 mg/dL — ABNORMAL HIGH (ref 65–99)

## 2014-12-12 LAB — BASIC METABOLIC PANEL
ANION GAP: 7 (ref 5–15)
BUN: 55 mg/dL — AB (ref 6–20)
CALCIUM: 8.2 mg/dL — AB (ref 8.9–10.3)
CO2: 23 mmol/L (ref 22–32)
CREATININE: 3.11 mg/dL — AB (ref 0.44–1.00)
Chloride: 106 mmol/L (ref 101–111)
GFR calc Af Amer: 14 mL/min — ABNORMAL LOW (ref >60)
GFR, EST NON AFRICAN AMERICAN: 12 mL/min — AB (ref >60)
GLUCOSE: 263 mg/dL — AB (ref 65–99)
Potassium: 4.6 mmol/L (ref 3.5–5.1)
Sodium: 136 mmol/L (ref 135–145)

## 2014-12-12 MED ORDER — DILTIAZEM HCL ER COATED BEADS 120 MG PO CP24
120.0000 mg | ORAL_CAPSULE | Freq: Every day | ORAL | Status: DC
  Administered 2014-12-12 – 2014-12-18 (×7): 120 mg via ORAL
  Filled 2014-12-12 (×7): qty 1

## 2014-12-12 NOTE — Progress Notes (Signed)
Spoke with Dr. Angelena Form regarding new cardizem order. Pt already received previously ordered dose this morning, but should still get newly ordered dose at this time per MD.   Toniann Ket

## 2014-12-12 NOTE — Progress Notes (Signed)
CBG 160 per glucometer.

## 2014-12-12 NOTE — Progress Notes (Signed)
[facility] Consultation      Assessment and Plan:  Acute hypoxic respiratory failure with right-sided pleural effusion. -I had a long discussion with patient's daughter and grandson at the bedside yesterday regarding the risks and benefits of a thoracentesis. The RN has been formulated. They would like to proceed with a thoracentesis, therefore, this has been ordered.  Right upper lobe lung nodule. -This appears to be stable, to minimally enlarged from previous CAT scan from 06/03/2013. Likelihood is that this represents granulomatous disease or a very slowly growing indolent lung cancer. -Discussed with the family, given her comorbidities and advanced age and poor functional status, would not recommend biopsy at this time, and they were in agreement.  Acute systolic congestive heart failure. -Echocardiogram showed ejection fraction of 45-50%, diuresis has been limited by renal function. -Discussed possible thoracentesis with family. They're going to consider this.  -Doubt significant infectious component, or bronchoconstriction. Can wean down steroids, no need for antibiotics for respiratory standpoint.    Date: 12/12/2014  MRN# [id number] Paige James 01-25-1921    HPI:  The patient is resting comfortably. No new complaints today.  She has been treated in the hospital with a course of steroids for possible COPD exacerbation, she was also started on a course of azithromycin. The patient was given diuresis to try to reduce the size of her fusion. However, dosing was limited because of development of acute kidney injury.   CT of the chest images and reports from 12/10/2014 and 06/03/2013 were reviewed. There is a small nodule in the right upper lobe, this appears to be slightly to not enlarged compared to previous CAT scan, this could represent an indolent cancer versus granulomatous disease which appears to be more likely. There is also a moderate right-sided  pleural effusion and a small left-sided pleural effusion, which were not seen on previous imaging. The effusions do not appear to be loculated.   Echocardiogram results from 12/08/2014 reviewed. -Ejection fraction was 45-50%. -Pulmonary systolic pressure was 53-97 mmHg. -Atrial fibrillation.    Allergies:  Review of patient's allergies indicates no known allergies.  Review of Systems: Gen:  Denies  fever, sweats, chills HEENT: Denies blurred vision, double vision. Cvc:  No dizziness, chest pain. Resp:   Denies cough or sputum porduction,  Gi: Denies swallowing difficulty, Gu:  Denies bladder incontinence, Ext:   No Joint pain, stiffness. Skin: No skin rash,  hives Endoc:  No polyuria, polydipsia. Psych: No depression, insomnia. Other:  All other systems were reviewed with the patient and were negative other that what is mentioned in the HPI.   Physical Examination:   VS: BP 159/94 mmHg  Pulse 71  Temp(Src) 98.4 F (36.9 C) (Oral)  Resp 20  Ht '5\' 5"'$  (1.651 m)  Wt 87.176 kg (192 lb 3 oz)  BMI 31.98 kg/m2  SpO2 96%  General Appearance: No distress  Neuro:without focal findings,  speech normal,  HEENT: PERRLA, EOM intact.   Pulmonary: normal breath sounds, No wheezing.  CardiovascularNormal S1,S2.  No m/r/g.   Abdomen: Benign, Soft, non-tender. Renal:  No costovertebral tenderness  GU:  No performed at this time. Endoc: No evident thyromegaly, no signs of acromegaly. Skin:   warm, no rashes, no ecchymosis  Extremities: normal, no cyanosis, clubbing.  Other findings:    LABORATORY PANEL:   CBC  Recent Labs Lab 12/10/14 0402  WBC 4.6  HGB 9.4*  HCT 29.1*  PLT 218   ------------------------------------------------------------------------------------------------------------------  Chemistries   Recent Labs  Lab 12/12/14 0453  NA 136  K 4.6  CL 106  CO2 23  GLUCOSE 263*  BUN 55*  CREATININE 3.11*  CALCIUM 8.2*    ------------------------------------------------------------------------------------------------------------------  Cardiac Enzymes  Recent Labs Lab 12/08/14 1319  TROPONINI 0.03   ------------------------------------------------------------  RADIOLOGY:  Ct Chest Wo Contrast  12/10/2014  CLINICAL DATA:  Congestive heart failure and renal failure. EXAM: CT CHEST WITHOUT CONTRAST TECHNIQUE: Multidetector CT imaging of the chest was performed following the standard protocol without IV contrast. COMPARISON:  CT 06/03/2013 FINDINGS: Mediastinum/Nodes: Postsurgical change in the RIGHT axilla of lymphadenectomy and RIGHT mastectomy . No supraclavicular or mediastinal adenopathy. No pericardial fluid. Coronary calcifications are present. The ascending aorta measures intimal calcification. No significant aneurysm. Esophagus is normal. Lungs/Pleura: There is a moderate layering RIGHT pleural effusion with associated RIGHT basilar passive atelectasis. This RIGHT effusion is increased from comparison exam. There is nodular pleural-parenchymal thickening at the RIGHT lung apex (image 9, series 3) which is similar to comparison exam. Small LEFT effusion is present. Upper abdomen: Limited view of the liver, kidneys, pancreas are unremarkable. Normal adrenal glands. Musculoskeletal: No aggressive osseous lesion. There is mild anasarca of the soft tissues of the thorax. IMPRESSION: 1. Bilateral pleural effusions, moderate on the RIGHT and small on the LEFT with associated bibasilar atelectasis. 2. Stable nodule corporal parenchymal thickening at the RIGHT lung apex. 3. No airspace disease evident. Electronically Signed   By: Suzy Bouchard M.D.   On: 12/10/2014 12:23       Thank  you for the consultation and for allowing [facility] to assist in the care of your patient. Our recommendations are noted above.  Please contact us if we can be of further service.   [name],  MD.  Board Certified in Internal Medicine, Pulmonary Medicine, [place], and Sleep Medicine.  [facility] Office Number: [phone]  Patricia Pesa, M.D.  Vilinda Boehringer, M.D.  Merton Border, M.D

## 2014-12-12 NOTE — Progress Notes (Addendum)
[facility] [place] NAME: Paige James    MR#:  [id number]  DATE OF BIRTH:  16-Dec-1920  SUBJECTIVE:  CHIEF COMPLAINT:   Chief Complaint  Patient presents with  . Altered Mental Status   Pt. Was a bit confused day before yesterday and got some Haldol , improved.  Still has some wheezing but mostly upper airway.  Patient states that she feels much better today, still remains on oxygen therapy at 2 L of oxygen through nasal cannula, however, is not on oxygen at home . Nephrologist does not recommend diuresis at present. Kidney function is not improving . Not on IV fluids REVIEW OF SYSTEMS:    Review of Systems  Constitutional: Negative for fever and chills.  HENT: Negative for congestion and tinnitus.   Eyes: Negative for blurred vision and double vision.  Respiratory: Positive for wheezing. Negative for cough and shortness of breath.   Cardiovascular: Negative for chest pain, orthopnea and PND.  Gastrointestinal: Negative for nausea, vomiting, abdominal pain and diarrhea.  Genitourinary: Negative for dysuria and hematuria.  Neurological: Positive for weakness (generalized). Negative for dizziness, sensory change and focal weakness.  All other systems reviewed and are negative.   Nutrition: Renal heart healthy Tolerating Diet: Yes Tolerating PT: Yes   DRUG ALLERGIES:  No Known Allergies  VITALS:  Blood pressure 159/94, pulse 71, temperature 98.4 F (36.9 C), temperature source Oral, resp. rate 20, height '5\' 5"'$  (1.651 m), weight 87.176 kg (192 lb 3 oz), SpO2 96 %.  PHYSICAL EXAMINATION:   Physical Exam  GENERAL:  81 y.o.-year-old patient lying in the bed in no acute distress. Comfortable intermittent wheezing. Upper airways EYES: Pupils equal, round, reactive to light and accommodation. No scleral icterus. Extraocular muscles intact.  HEENT: Head atraumatic, normocephalic. Oropharynx and nasopharynx clear.  NECK:  Supple,  no jugular venous distention. No thyroid enlargement, no tenderness.  LUNGS: No evidence of accessory muscles. Upper Airway wheezing b/l. No rales, rhonchi. Good air entry bilaterally. No rhonchi, rales CARDIOVASCULAR: S1, S2 irregular. No murmurs, rubs, or gallops.  ABDOMEN: Soft, nontender, nondistended. Bowel sounds present. No organomegaly or mass.  EXTREMITIES: No cyanosis, clubbing or edema b/l.    NEUROLOGIC: Cranial nerves II through XII are intact. No focal Motor or sensory deficits b/l.   PSYCHIATRIC: The patient is alert and oriented x 1.  SKIN: No obvious rash, lesion, or ulcer.    LABORATORY PANEL:   CBC  Recent Labs Lab 12/10/14 0402  WBC 4.6  HGB 9.4*  HCT 29.1*  PLT 218   ------------------------------------------------------------------------------------------------------------------  Chemistries   Recent Labs Lab 12/12/14 0453  NA 136  K 4.6  CL 106  CO2 23  GLUCOSE 263*  BUN 55*  CREATININE 3.11*  CALCIUM 8.2*   ------------------------------------------------------------------------------------------------------------------  Cardiac Enzymes  Recent Labs Lab 12/08/14 1319  TROPONINI 0.03   ------------------------------------------------------------------------------------------------------------------  RADIOLOGY:  Ct Chest Wo Contrast  12/10/2014  CLINICAL DATA:  Congestive heart failure and renal failure. EXAM: CT CHEST WITHOUT CONTRAST TECHNIQUE: Multidetector CT imaging of the chest was performed following the standard protocol without IV contrast. COMPARISON:  CT 06/03/2013 FINDINGS: Mediastinum/Nodes: Postsurgical change in the RIGHT axilla of lymphadenectomy and RIGHT mastectomy . No supraclavicular or mediastinal adenopathy. No pericardial fluid. Coronary calcifications are present. The ascending aorta measures intimal calcification. No significant aneurysm. Esophagus is normal. Lungs/Pleura: There is a moderate layering RIGHT pleural  effusion with associated RIGHT basilar passive atelectasis. This RIGHT effusion is increased from  comparison exam. There is nodular pleural-parenchymal thickening at the RIGHT lung apex (image 9, series 3) which is similar to comparison exam. Small LEFT effusion is present. Upper abdomen: Limited view of the liver, kidneys, pancreas are unremarkable. Normal adrenal glands. Musculoskeletal: No aggressive osseous lesion. There is mild anasarca of the soft tissues of the thorax. IMPRESSION: 1. Bilateral pleural effusions, moderate on the RIGHT and small on the LEFT with associated bibasilar atelectasis. 2. Stable nodule corporal parenchymal thickening at the RIGHT lung apex. 3. No airspace disease evident. Electronically Signed   By: Suzy Bouchard M.D.   On: 12/10/2014 12:23     ASSESSMENT AND PLAN:   81 year old female with past medical history of chronic kidney disease stage III, hypertension, type 2 diabetes without, condition, dementia, osteoarthritis, hypothyroidism who presented to the hospital due to shortness of breath and also mental status.  #1 altered mental status-this is likely multifactorial and related to underlying dementia with sundowning and also complicated with underlying renal failure. -Mental status has improved . No agitation . Patient received her some Haldol daily for yesterday. Avoiding benzodiazepines.-Continue to follow mental status, overall stable  #2 acute respiratory failure with hypoxia-likely secondary to mild CHF, questionable underlying acute bronchitis. -CT chest yesterday showing a moderate right-sided right pleural effusion and a small left pleural effusion and a small pulmonary nodule. Appreciate pulmonary consult , patient would benefit from diuresis. However, due to poor kidney function when not using diuretics at this time.    -Hold Lasix given renal failure. Tapering steroids, finished Z-Pak.   #3 acute bronchitis-continue Xopenex nebs, Zithromax. Tapering  steroids. Patient is clinically improving overall and her incision requirement also is improving  CT chest yesterday showing no airway disease but evidence of bilateral pleural effusions more on the right than the left. Pulmonary consult is appreciated, family is to make decisions about thoracentesis, although CHF is likely implicated in pleural effusions.   #4 CHF-acute on chronic systolic- diastolic dysfunction. -Patient's echocardiogram showed mild LV dysfunction with EF of 45%. -Appreciate cardiology input. Cont. Coreg. Holding Lasix given worsening renal function.   #5 new-onset atrial fibrillation-rate controlled and continue Coreg, Cardizem. -Given her advanced age and high fall risk she is likely not a long-term anticoagulation candidate.  -Continue aspirin. Appreciate cardiology input  #6 . Acute on chronic renal failure with history of chronic kidney disease stage III-IV-patient likely has chronic kidney disease secondary to diabetes hypertension/age. Current acute increase in creatinine could be cardiorenal given her CHF. Kidney function is worse despite conservative therapy. Patient is off diuretics-follow BUN and creatinine and urine output.-Appreciate nephrology consult. Renal US showing no evidence of obstruction but evidence of medical renal disease.   #7 depression-continue Prozac  #8 urinary incontinence-continue oxybutynin.  #9 GERD-continue Protonix.  #10 hypothyroidism-continue Synthroid. Patient's TSH is slightly elevated. We'll increase her Synthroid dose slightly upon discharge  Await PT Eval.    All the records are reviewed and case discussed with Care Management/Social Workerr. Management plans discussed with the patient, family and they are in agreement.  CODE STATUS: DO NOT RESUSCITATE  DVT Prophylaxis: Heparin subcutaneous  TOTAL TIME TAKING CARE OF THIS PATIENT: 44mnutes.     .Marland Kitchen VTheodoro GristM.D on 12/12/2014 at 11:14 AM  Between 7am to 6pm -  Pager - [phone]  After 6pm go to [email or web address] - password [email or web address] [facility]  Office  [phone] CC: Primary care physician; No primary care provider on file.

## 2014-12-12 NOTE — Progress Notes (Signed)
[facility]  ROUNDING NOTE   Subjective:  On IVF hydration but Cr worse this AM.  Resting comfortably at the moment.  Objective:  Vital signs in last 24 hours:  Temp:  [98.1 F (36.7 C)-98.4 F (36.9 C)] 98.2 F (36.8 C) (10/15 1141) Pulse Rate:  [54-75] 54 (10/15 1431) Resp:  [20] 20 (10/15 1141) BP: (134-159)/(76-98) 134/98 mmHg (10/15 1431) SpO2:  [95 %-96 %] 95 % (10/15 1141) Weight:  [87.176 kg (192 lb 3 oz)] 87.176 kg (192 lb 3 oz) (10/15 0500)  Weight change: 0.902 kg (1 lb 15.8 oz) Filed Weights   12/10/14 0653 12/11/14 0558 12/12/14 0500  Weight: 87.454 kg (192 lb 12.8 oz) 86.274 kg (190 lb 3.2 oz) 87.176 kg (192 lb 3 oz)    Intake/Output: I/O last 3 completed shifts: In: 1675.8 [P.O.:720; I.V.:955.8] Out: 300 [Urine:300]   Intake/Output this shift:  Total I/O In: 240 [P.O.:240] Out: 0   Physical Exam: General: NAD  Head: Normocephalic, atraumatic. Moist oral mucosal membranes  Eyes: Anicteric  Neck: Supple, trachea midline  Lungs:  Bilateral wheezing normal effort  Heart: Regular rate and rhythm  Abdomen:  Soft, nontender, BS present  Extremities:  trace peripheral edema.  Neurologic: Nonfocal, moving all four extremities  Skin: No lesions       Basic Metabolic Panel:  Recent Labs Lab 12/08/14 1319 12/08/14 2049 12/09/14 0447 12/10/14 0402 12/11/14 0626 12/12/14 0453  NA 132*  --  141 137 141 136  K 5.0  --  4.0 4.3 4.0 4.6  CL 105  --  107 104 109 106  CO2 23  --  25 26 24 23   GLUCOSE 186*  --  124* 192* 95 263*  BUN 40*  --  43* 47* 45* 55*  CREATININE 2.64* 2.71* 2.80* 3.02* 2.74* 3.11*  CALCIUM 7.6*  --  7.8* 8.2* 8.2* 8.2*    Liver Function Tests: No results for input(s): AST, ALT, ALKPHOS, BILITOT, PROT, ALBUMIN in the last 168 hours. No results for input(s): LIPASE, AMYLASE in the last 168 hours. No results for input(s): AMMONIA in the last 168 hours.  CBC:  Recent Labs Lab 12/08/14 1319 12/08/14 2049  12/09/14 0447 12/10/14 0402  WBC 5.1 5.1 4.7 4.6  NEUTROABS 3.6  --  2.8  --   HGB 9.1* 9.5* 8.6* 9.4*  HCT 28.2* 28.6* 27.0* 29.1*  MCV 99.8 99.6 100.1* 100.4*  PLT 193 194 198 218    Cardiac Enzymes:  Recent Labs Lab 12/08/14 1319  TROPONINI 0.03    BNP: Invalid input(s): POCBNP  CBG:  Recent Labs Lab 12/11/14 1236 12/11/14 1650 12/11/14 2037 12/12/14 0748 12/12/14 1211  GLUCAP 119* 292* 219* 237* 160*    Microbiology: No results found for this or any previous visit.  Coagulation Studies: No results for input(s): LABPROT, INR in the last 72 hours.  Urinalysis: No results for input(s): COLORURINE, LABSPEC, PHURINE, GLUCOSEU, HGBUR, BILIRUBINUR, KETONESUR, PROTEINUR, UROBILINOGEN, NITRITE, LEUKOCYTESUR in the last 72 hours.  Invalid input(s): APPERANCEUR    Imaging: No results found.   Medications:   . sodium chloride 50 mL/hr at 12/11/14 1132   . aspirin EC  81 mg Oral Daily  . azithromycin  250 mg Oral Daily  . carvedilol  6.25 mg Oral BID  . cetirizine  10 mg Oral Daily  . diltiazem  120 mg Oral Daily  . FLUoxetine  10 mg Oral Daily  . heparin  5,000 Units Subcutaneous 3 times per day  .  insulin aspart  0-5 Units Subcutaneous QHS  . insulin aspart  0-9 Units Subcutaneous TID WC  . levothyroxine  125 mcg Oral QAC breakfast  . methylPREDNISolone (SOLU-MEDROL) injection  40 mg Intravenous Daily  . multivitamin with minerals  1 tablet Oral Daily  . olopatadine  1 drop Both Eyes Daily  . oxybutynin  10 mg Oral Daily  . pantoprazole  40 mg Oral Daily  . sodium bicarbonate  650 mg Oral BID  . sodium chloride  3 mL Intravenous Q12H  . vitamin A & D  1 application Topical Q M,W,F  . Vitamin D (Ergocalciferol)  50,000 Units Oral Q7 days   sodium chloride, acetaminophen, haloperidol lactate, levalbuterol, ondansetron (ZOFRAN) IV, sodium chloride  Assessment/ Plan:  81 y.o. female  with a PMHX of chronic kidney disease stage IV baseline EGFR 29 and  creatinine of 1.7, diabetes mellitus type 2, dementia, hypertension, osteophytes, depression, who was admitted to [facility] on 12/08/2014 for evaluation of altered mental status.   1. Acute renal failure/CKD stage IV baseline Cr 1.7 egfr 29. Acute renal failure likely secondary to altered cardiorenal hemodynamics. Chronic kidney disease is likely multifactorial with contributions from diabetes mellitus, hypertension, and age. Renal US shows small kidneys. SPEP negative. - Renal function worse today, Cr up to 3.11, for now continue hydration and monitor renal function trend closely, avoid nephrotoxins as possible.  2. Acute diastolic heart failure. Ejection fraction was found to be 45-50% however right-sided pressures were elevated.  -pt was temporarily off IVFs as she pulled her IV out, currently on IVFs, doesn't appear to be in worsening heart failure.    3. Anemia of CKD: last hgb 9.4, will need outpt monitoring as well to determine if she will require treatment with epogen.    LOS: 4 Paige James 10/15/20163:15 PM

## 2014-12-12 NOTE — Progress Notes (Signed)
Per Ultrasound tech, thoracentesis will be scheduled for Monday unless situation becomes emergent, then MD would need to call Radiologist. Will continue to monitor. Family was previously aware that procedure would likely be Monday.

## 2014-12-12 NOTE — Progress Notes (Signed)
     SUBJECTIVE:Breathing is better. No pain.   BP 152/84 mmHg  Pulse 72  Temp(Src) 98.2 F (36.8 C) (Oral)  Resp 20  Ht '5\' 5"'$  (1.651 m)  Wt 192 lb 3 oz (87.176 kg)  BMI 31.98 kg/m2  SpO2 95%  Intake/Output Summary (Last 24 hours) at 12/12/14 1314 Last data filed at 12/12/14 1004  Gross per 24 hour  Intake 1435.83 ml  Output      0 ml  Net 1435.83 ml    PHYSICAL EXAM General: Well developed, well nourished, in no acute distress. Alert and oriented x 3.  Psych:  Good affect, responds appropriately Neck: No JVD. No masses noted.  Lungs: Clear bilaterally with no wheezes or rhonci noted.  Heart: Irregular with no murmurs noted. Abdomen: Bowel sounds are present. Soft, non-tender.  Extremities: No lower extremity edema.   LABS: Basic Metabolic Panel:  Recent Labs  12/11/14 0626 12/12/14 0453  NA 141 136  K 4.0 4.6  CL 109 106  CO2 24 23  GLUCOSE 95 263*  BUN 45* 55*  CREATININE 2.74* 3.11*  CALCIUM 8.2* 8.2*   CBC:  Recent Labs  12/10/14 0402  WBC 4.6  HGB 9.4*  HCT 29.1*  MCV 100.4*  PLT 218   Current Meds: . aspirin EC  81 mg Oral Daily  . azithromycin  250 mg Oral Daily  . carvedilol  6.25 mg Oral BID  . cetirizine  10 mg Oral Daily  . diltiazem  30 mg Oral Q12H  . FLUoxetine  10 mg Oral Daily  . heparin  5,000 Units Subcutaneous 3 times per day  . insulin aspart  0-5 Units Subcutaneous QHS  . insulin aspart  0-9 Units Subcutaneous TID WC  . levothyroxine  125 mcg Oral QAC breakfast  . methylPREDNISolone (SOLU-MEDROL) injection  40 mg Intravenous Daily  . multivitamin with minerals  1 tablet Oral Daily  . olopatadine  1 drop Both Eyes Daily  . oxybutynin  10 mg Oral Daily  . pantoprazole  40 mg Oral Daily  . sodium bicarbonate  650 mg Oral BID  . sodium chloride  3 mL Intravenous Q12H  . vitamin A & D  1 application Topical Q M,W,F  . Vitamin D (Ergocalciferol)  50,000 Units Oral Q7 days     ASSESSMENT AND PLAN: 81 y.o. female with a  history of metastatic breast cancer status post right-sided mastectomy with chemotherapy and radiation, currently negative for malignancy, history of diabetes, hypertension, hypothyroidism who lives at [place] assisted living home. She was admitted 12/10/14 with 3-4 days of worsening shortness of breath, weakness. She was found to be in atrial fibrillation in the ED.   1) Acute respiratory distress, hypoxia: Initially felt to be from CHF but with diuresis her renal function worsened. She now has a moderate sized right pleural effusion. Plans for thoracentesis per Pulmonary team. LV systolic function is mildly depressed.   2) Atrial fibrillation: ? Duration. Possibly persistent. May have had chronic atrial fibrillation for long period time. Rate is controlled. Will change to Cardizem CD 120 mg daily. Not a good candidate for anticoagulation given her anemia, advanced age and high fall risk. Family confirms she has had recent fall.   3) Chronic kidney disease, stage IV: nephrology following.    Tommy Minichiello  10/15/20161:14 PM

## 2014-12-12 NOTE — Care Management Note (Addendum)
Case Management Note  Patient Details  Name: Paige James MRN: [id number] Date of Birth: 03/06/1920  Subjective/Objective:     This Probation officer spoke with [name] who reports that Ms Slight lives at a [facility]                        [phone]. Daughter cannot remember the name of the [facility]. The number provided by daughter [name] is to [place] who report that [name] was recently moved from that facility to "[facility]". [name] is under the legal guardianship of DSS.                 Action/Plan:   Expected Discharge Date:                  Expected Discharge Plan:     In-House Referral:     Discharge planning Services     Post Acute Care Choice:    Choice offered to:     DME Arranged:    DME Agency:     HH Arranged:    [place] Agency:     Status of Service:     Medicare Important Message Given:  Yes-third notification given Date Medicare IM Given:    Medicare IM give by:    Date Additional Medicare IM Given:    Additional Medicare Important Message give by:     If discussed at [place] of Stay Meetings, dates discussed:    Additional Comments:  [name], RN 12/12/2014, 3:50 PM

## 2014-12-13 DIAGNOSIS — I5031 Acute diastolic (congestive) heart failure: Secondary | ICD-10-CM

## 2014-12-13 LAB — CBC
HCT: 25.5 % — ABNORMAL LOW (ref 35.0–47.0)
Hemoglobin: 8.4 g/dL — ABNORMAL LOW (ref 12.0–16.0)
MCH: 33.1 pg (ref 26.0–34.0)
MCHC: 33.1 g/dL (ref 32.0–36.0)
MCV: 99.9 fL (ref 80.0–100.0)
PLATELETS: 182 10*3/uL (ref 150–440)
RBC: 2.55 MIL/uL — ABNORMAL LOW (ref 3.80–5.20)
RDW: 15.1 % — AB (ref 11.5–14.5)
WBC: 6.2 10*3/uL (ref 3.6–11.0)

## 2014-12-13 LAB — GLUCOSE, CAPILLARY
GLUCOSE-CAPILLARY: 143 mg/dL — AB (ref 65–99)
GLUCOSE-CAPILLARY: 254 mg/dL — AB (ref 65–99)
Glucose-Capillary: 198 mg/dL — ABNORMAL HIGH (ref 65–99)
Glucose-Capillary: 297 mg/dL — ABNORMAL HIGH (ref 65–99)

## 2014-12-13 LAB — BASIC METABOLIC PANEL
Anion gap: 7 (ref 5–15)
BUN: 59 mg/dL — AB (ref 6–20)
CALCIUM: 8.2 mg/dL — AB (ref 8.9–10.3)
CO2: 24 mmol/L (ref 22–32)
CREATININE: 2.88 mg/dL — AB (ref 0.44–1.00)
Chloride: 107 mmol/L (ref 101–111)
GFR calc Af Amer: 15 mL/min — ABNORMAL LOW (ref >60)
GFR, EST NON AFRICAN AMERICAN: 13 mL/min — AB (ref >60)
Glucose, Bld: 154 mg/dL — ABNORMAL HIGH (ref 65–99)
Potassium: 4.4 mmol/L (ref 3.5–5.1)
SODIUM: 138 mmol/L (ref 135–145)

## 2014-12-13 MED ORDER — EPOETIN ALFA 20000 UNIT/ML IJ SOLN
20000.0000 [IU] | Freq: Once | INTRAMUSCULAR | Status: AC
  Administered 2014-12-13: 20000 [IU] via SUBCUTANEOUS
  Filled 2014-12-13 (×2): qty 1

## 2014-12-13 NOTE — Progress Notes (Signed)
[facility]  ROUNDING NOTE   Subjective:  Renal function improved. Cr down to 2.88. Daughter at bedside this AM.   Objective:  Vital signs in last 24 hours:  Temp:  [98 F (36.7 C)-98.7 F (37.1 C)] 98 F (36.7 C) (10/16 1140) Pulse Rate:  [54-124] 66 (10/16 1140) Resp:  [18-20] 20 (10/16 1140) BP: (124-152)/(71-98) 152/86 mmHg (10/16 1140) SpO2:  [98 %-100 %] 100 % (10/16 1140) Weight:  [88.678 kg (195 lb 8 oz)] 88.678 kg (195 lb 8 oz) (10/16 0513)  Weight change: 1.503 kg (3 lb 5 oz) Filed Weights   12/11/14 0558 12/12/14 0500 12/13/14 0513  Weight: 86.274 kg (190 lb 3.2 oz) 87.176 kg (192 lb 3 oz) 88.678 kg (195 lb 8 oz)    Intake/Output: I/O last 3 completed shifts: In: 2185 [P.O.:360; I.V.:1825] Out: 0    Intake/Output this shift:  Total I/O In: 240 [P.O.:240] Out: 0   Physical Exam: General: NAD  Head: Normocephalic, atraumatic. Moist oral mucosal membranes  Eyes: Anicteric  Neck: Supple, trachea midline  Lungs:  Mild bilateral wheezing normal effort  Heart: Regular rate and rhythm  Abdomen:  Soft, nontender, BS present  Extremities:  trace peripheral edema.  Neurologic: Nonfocal, moving all four extremities  Skin: No lesions       Basic Metabolic Panel:  Recent Labs Lab 12/09/14 0447 12/10/14 0402 12/11/14 0626 12/12/14 0453 12/13/14 0452  NA 141 137 141 136 138  K 4.0 4.3 4.0 4.6 4.4  CL 107 104 109 106 107  CO2 25 26 24 23 24   GLUCOSE 124* 192* 95 263* 154*  BUN 43* 47* 45* 55* 59*  CREATININE 2.80* 3.02* 2.74* 3.11* 2.88*  CALCIUM 7.8* 8.2* 8.2* 8.2* 8.2*    Liver Function Tests: No results for input(s): AST, ALT, ALKPHOS, BILITOT, PROT, ALBUMIN in the last 168 hours. No results for input(s): LIPASE, AMYLASE in the last 168 hours. No results for input(s): AMMONIA in the last 168 hours.  CBC:  Recent Labs Lab 12/08/14 1319 12/08/14 2049 12/09/14 0447 12/10/14 0402 12/13/14 0452  WBC 5.1 5.1 4.7 4.6 6.2   NEUTROABS 3.6  --  2.8  --   --   HGB 9.1* 9.5* 8.6* 9.4* 8.4*  HCT 28.2* 28.6* 27.0* 29.1* 25.5*  MCV 99.8 99.6 100.1* 100.4* 99.9  PLT 193 194 198 218 182    Cardiac Enzymes:  Recent Labs Lab 12/08/14 1319  TROPONINI 0.03    BNP: Invalid input(s): POCBNP  CBG:  Recent Labs Lab 12/12/14 1211 12/12/14 1635 12/12/14 2100 12/13/14 0744 12/13/14 1140  GLUCAP 160* 150* 197* 143* 198*    Microbiology: No results found for this or any previous visit.  Coagulation Studies: No results for input(s): LABPROT, INR in the last 72 hours.  Urinalysis: No results for input(s): COLORURINE, LABSPEC, PHURINE, GLUCOSEU, HGBUR, BILIRUBINUR, KETONESUR, PROTEINUR, UROBILINOGEN, NITRITE, LEUKOCYTESUR in the last 72 hours.  Invalid input(s): APPERANCEUR    Imaging: No results found.   Medications:   . sodium chloride 50 mL/hr at 12/11/14 1132   . aspirin EC  81 mg Oral Daily  . carvedilol  6.25 mg Oral BID  . cetirizine  10 mg Oral Daily  . diltiazem  120 mg Oral Daily  . FLUoxetine  10 mg Oral Daily  . heparin  5,000 Units Subcutaneous 3 times per day  . insulin aspart  0-5 Units Subcutaneous QHS  . insulin aspart  0-9 Units Subcutaneous TID WC  . levothyroxine  125  mcg Oral QAC breakfast  . methylPREDNISolone (SOLU-MEDROL) injection  40 mg Intravenous Daily  . multivitamin with minerals  1 tablet Oral Daily  . olopatadine  1 drop Both Eyes Daily  . oxybutynin  10 mg Oral Daily  . pantoprazole  40 mg Oral Daily  . sodium bicarbonate  650 mg Oral BID  . sodium chloride  3 mL Intravenous Q12H  . vitamin A & D  1 application Topical Q M,W,F  . Vitamin D (Ergocalciferol)  50,000 Units Oral Q7 days   sodium chloride, acetaminophen, haloperidol lactate, levalbuterol, ondansetron (ZOFRAN) IV, sodium chloride  Assessment/ Plan:  81 y.o. female  with a PMHX of chronic kidney disease stage IV baseline EGFR 29 and creatinine of 1.7, diabetes mellitus type 2, dementia,  hypertension, osteophytes, depression, who was admitted to [facility] on 12/08/2014 for evaluation of altered mental status. [facility] nephrology as outpt.  1. Acute renal failure/CKD stage IV baseline Cr 1.7 egfr 29. Acute renal failure likely secondary to altered cardiorenal hemodynamics. Chronic kidney disease is likely multifactorial with contributions from diabetes mellitus, hypertension, and age. Renal US shows small kidneys. SPEP negative.  [facility] nephrology as outpt. - Cr remains above baseline though is trending down slowly.  Continue gentle IVF hydration as before, her oral intake appears to be improving.    2. Acute diastolic heart failure. Ejection fraction was found to be 45-50% however right-sided pressures were elevated.  -this appears to have improved, no worsening of heart failure while on IVFs.  Would continue to monitor for signs of respiratory distress.     3. Anemia of CKD: last dropped to 8.4, in part dilutional, will go ahead and give a dose of epogen today, 20000 units [place] x one.   LOS: 5 Aleksandr Pellow 10/16/20162:13 PM

## 2014-12-13 NOTE — Progress Notes (Signed)
SUBJECTIVE: Breathing is improved this am. No pain.   Tele: atrial fib, rate 60s  BP 135/85 mmHg  Pulse 55  Temp(Src) 98.1 F (36.7 C) (Oral)  Resp 18  Ht '5\' 5"'$  (1.651 m)  Wt 195 lb 8 oz (88.678 kg)  BMI 32.53 kg/m2  SpO2 98%  Intake/Output Summary (Last 24 hours) at 12/13/14 1010 Last data filed at 12/13/14 0925  Gross per 24 hour  Intake   1585 ml  Output      0 ml  Net   1585 ml    PHYSICAL EXAM General: Elderly female, well developed, well nourished, in no acute distress. Alert and oriented x 3.  Psych:  Good affect, responds appropriately Neck: No JVD. No masses noted.  Lungs: Decreased BS right base.  Heart: Irregular with no murmurs noted. Abdomen: Bowel sounds are present. Soft, non-tender.  Extremities: No lower extremity edema.   LABS: Basic Metabolic Panel:  Recent Labs  12/12/14 0453 12/13/14 0452  NA 136 138  K 4.6 4.4  CL 106 107  CO2 23 24  GLUCOSE 263* 154*  BUN 55* 59*  CREATININE 3.11* 2.88*  CALCIUM 8.2* 8.2*   CBC:  Recent Labs  12/13/14 0452  WBC 6.2  HGB 8.4*  HCT 25.5*  MCV 99.9  PLT 182   Current Meds: . aspirin EC  81 mg Oral Daily  . azithromycin  250 mg Oral Daily  . carvedilol  6.25 mg Oral BID  . cetirizine  10 mg Oral Daily  . diltiazem  120 mg Oral Daily  . FLUoxetine  10 mg Oral Daily  . heparin  5,000 Units Subcutaneous 3 times per day  . insulin aspart  0-5 Units Subcutaneous QHS  . insulin aspart  0-9 Units Subcutaneous TID WC  . levothyroxine  125 mcg Oral QAC breakfast  . methylPREDNISolone (SOLU-MEDROL) injection  40 mg Intravenous Daily  . multivitamin with minerals  1 tablet Oral Daily  . olopatadine  1 drop Both Eyes Daily  . oxybutynin  10 mg Oral Daily  . pantoprazole  40 mg Oral Daily  . sodium bicarbonate  650 mg Oral BID  . sodium chloride  3 mL Intravenous Q12H  . vitamin A & D  1 application Topical Q M,W,F  . Vitamin D (Ergocalciferol)  50,000 Units Oral Q7 days     ASSESSMENT AND  PLAN: 81 y.o. female with a history of metastatic breast cancer status post right-sided mastectomy with chemotherapy and radiation, currently negative for malignancy, history of diabetes, hypertension, hypothyroidism who lives at [facility] assisted [facility].She was admitted 12/10/14 with 3-4 days of worsening shortness of breath, weakness. She was found to be in atrial fibrillation in the ED.   1) Acute respiratory distress, hypoxia: Initially felt to be from CHF but with diuresis her renal function worsened and diuretics have been held. She was found to have a moderate sized right pleural effusion. Plans for thoracentesis per Pulmonary team team. LV systolic function is mildly depressed.   2) Atrial fibrillation: Unknown duration/onset. May have had chronic atrial fibrillation. Rate is controlled. Will continue Cardizem CD 120 mg daily. Not a good candidate for anticoagulation given her anemia, advanced age and high fall risk. Family confirms she has had recent fall.   3) Chronic kidney disease, stage IV: Nephrology following.  4. Acute diastolic CHF: Weight is slowly increasing. Renal function is improving. Diuretics are being held by Nephrology.     MCALHANY,CHRISTOPHER  10/16/201610:10 AM

## 2014-12-13 NOTE — Progress Notes (Signed)
Patient resting quietly most of the day. Breathing seems to be improved compared to yesterday, however she still gets very short of breath and wheezy with exertion. Family at bedside most of the day today. No complaints. Planning for thoracentesis tomorrow.

## 2014-12-13 NOTE — Progress Notes (Signed)
[place] at [place] NAME: Paige James    MR#:  [id number]  DATE OF BIRTH:  [birth year]  SUBJECTIVE:  CHIEF COMPLAINT:   Chief Complaint  Patient presents with  . Altered Mental Status   the patient is a 81 year old female with history of metastatic breast cancer status post right-sided mastectomy, diabetes, hypertension, hypothyroidism who presents to the hospital with 3 or 4 days of shortness of breath and weakness. She was found to be in A. Fib. Echo revealed ejection fraction of 45% to 50%. Her chest x-ray revealed bilateral pleural effusions, more on the right, diuresis was tried, however, patient's kidney function worsened. Diuretics were placed on hold and then now patient is receiving some IV fluids to recover kidney function. Patient is complaining of shortness of breath, some wheezing. Patient converted into sinus rhythm.   REVIEW OF SYSTEMS:    Review of Systems  Constitutional: Negative for fever and chills.  HENT: Negative for congestion and tinnitus.   Eyes: Negative for blurred vision and double vision.  Respiratory: Positive for wheezing. Negative for cough and shortness of breath.   Cardiovascular: Negative for chest pain, orthopnea and PND.  Gastrointestinal: Negative for nausea, vomiting, abdominal pain and diarrhea.  Genitourinary: Negative for dysuria and hematuria.  Neurological: Positive for weakness (generalized). Negative for dizziness, sensory change and focal weakness.  All other systems reviewed and are negative.   Nutrition: Renal heart healthy Tolerating Diet: Yes Tolerating PT: Yes   DRUG ALLERGIES:  No Known Allergies  VITALS:  Blood pressure 140/71, pulse 70, temperature 98.1 F (36.7 C), temperature source Oral, resp. rate 18, height '5\' 5"'$  (1.651 m), weight 88.678 kg (195 lb 8 oz), SpO2 98 %.  PHYSICAL EXAMINATION:   Physical Exam  GENERAL:  81 y.o.-year-old patient lying in the bed in no  acute distress. Comfortable,  intermittent wheezing. No significant shortness of breath EYES: Pupils equal, round, reactive to light and accommodation. No scleral icterus. Extraocular muscles intact.  HEENT: Head atraumatic, normocephalic. Oropharynx and nasopharynx clear.  NECK:  Supple, no jugular venous distention. No thyroid enlargement, no tenderness.  LUNGS: No evidence of accessory muscles. Upper Airway wheezing b/l. No rales, rhonchi. Poor air entry bilaterally. No rhonchi, rales, markedly diminished breath sounds bilaterally at bases CARDIOVASCULAR: S1, S2 , regular. No murmurs, rubs, or gallops.  ABDOMEN: Soft, nontender, nondistended. Bowel sounds present. No organomegaly or mass.  EXTREMITIES: No cyanosis, clubbing or edema b/l.    NEUROLOGIC: Cranial nerves II through XII are intact. No focal Motor or sensory deficits b/l.   PSYCHIATRIC: The patient is alert and oriented x 1.  SKIN: No obvious rash, lesion, or ulcer.    LABORATORY PANEL:   CBC  Recent Labs Lab 12/13/14 0452  WBC 6.2  HGB 8.4*  HCT 25.5*  PLT 182   ------------------------------------------------------------------------------------------------------------------  Chemistries   Recent Labs Lab 12/13/14 0452  NA 138  K 4.4  CL 107  CO2 24  GLUCOSE 154*  BUN 59*  CREATININE 2.88*  CALCIUM 8.2*   ------------------------------------------------------------------------------------------------------------------  Cardiac Enzymes  Recent Labs Lab 12/08/14 1319  TROPONINI 0.03   ------------------------------------------------------------------------------------------------------------------  RADIOLOGY:  No results found.   ASSESSMENT AND PLAN:   81 year old female with past medical history of chronic kidney disease stage III, hypertension, type 2 diabetes without, condition, dementia, osteoarthritis, hypothyroidism who presented to the hospital due to shortness of breath and also mental  status.  #1 altered mental status-this is likely multifactorial and  related to underlying dementia with sundowning and also complicated with underlying renal failure. -Mental status has improved . No agitation . Patient received her some Haldol few days ago. Avoiding benzodiazepines.-Continue to follow mental status, overall stable  #2 acute respiratory failure with hypoxia-likely secondary to mild CHF, questionable underlying acute bronchitis/COPD exacerbation. -CT chest showing a moderate right-sided right pleural effusion and a small left pleural effusion and a small pulmonary nodule. Appreciate pulmonary consult , patient would benefit from diuresis. However, due to poor kidney function . We are not using diuretics at this time.    -Hold Lasix given renal failure. Tapering steroids, finished Z-Pak. Relatively clinically stable  #3 acute bronchitis with COPD exacerbation-continue Xopenex nebs, Zithromax. Tapering steroids. Patient is clinically improving overall and her incision requirement also is improving  CT chest yesterday showing no airway disease but evidence of bilateral pleural effusions more on the right than the left. Pulmonary consult is appreciated, family is to make decisions about thoracentesis, although CHF is likely implicated in pleural effusions.   #4 CHF-acute on chronic combined systolic- diastolic dysfunction. -Patient's echocardiogram showed mild LV dysfunction with EF of 45%. -Appreciate cardiology input. Cont. Coreg. Holding Lasix given poor renal function.   #5 new-onset atrial fibrillation, now in sinus rhythm-rate controlled and continue Coreg, Cardizem. -Given her advanced age and high fall risk she is likely not a long-term anticoagulation candidate.  -Continue aspirin. Appreciate cardiology input  #6 . Acute on chronic renal failure with history of chronic kidney disease stage III-IV-patient likely has chronic kidney disease secondary to diabetes  hypertension/age. Current acute increase in creatinine could be cardiorenal given her CHF. Kidney function is better today with conservative therapy. Patient is off diuretics-follow BUN and creatinine and urine output.-Appreciate nephrology consult. Renal US showing no evidence of obstruction but evidence of medical renal disease.   #7 depression-continue Prozac  #8 urinary incontinence-continue oxybutynin.  #9 GERD-continue Protonix.  #10 hypothyroidism-continue Synthroid. Patient's TSH is slightly elevated. We'll increase her Synthroid dose slightly upon discharge  Await PT Eval.    All the records are reviewed and case discussed with Care Management/Social Workerr. Management plans discussed with the patient, family and they are in agreement.  CODE STATUS: DO NOT RESUSCITATE  DVT Prophylaxis: Heparin subcutaneous  TOTAL TIME TAKING CARE OF THIS PATIENT: 30mnutes.     .Marland Kitchen VTheodoro GristM.D on 12/13/2014 at 10:32 AM  Between 7am to 6pm - Pager - [phone]  After 6pm go to [email or web address] - password [email or web address] [facility] EEast St. LouisHospitalists  Office  [phone] CC: Primary care physician; No primary care provider on file.

## 2014-12-14 LAB — GLUCOSE, CAPILLARY
Glucose-Capillary: 244 mg/dL — ABNORMAL HIGH (ref 65–99)
Glucose-Capillary: 310 mg/dL — ABNORMAL HIGH (ref 65–99)
Glucose-Capillary: 256 mg/dL — ABNORMAL HIGH (ref 65–99)

## 2014-12-14 LAB — CREATININE, SERUM
CREATININE: 2.77 mg/dL — AB (ref 0.44–1.00)
GFR, EST AFRICAN AMERICAN: 16 mL/min — AB (ref >60)
GFR, EST NON AFRICAN AMERICAN: 14 mL/min — AB (ref >60)

## 2014-12-14 MED ORDER — HYDRALAZINE HCL 25 MG PO TABS
25.0000 mg | ORAL_TABLET | Freq: Four times a day (QID) | ORAL | Status: DC
Start: 2014-12-14 — End: 2014-12-18
  Administered 2014-12-14 – 2014-12-18 (×18): 25 mg via ORAL
  Filled 2014-12-14 (×18): qty 1

## 2014-12-14 MED ORDER — ISOSORBIDE MONONITRATE ER 30 MG PO TB24
30.0000 mg | ORAL_TABLET | Freq: Every day | ORAL | Status: DC
  Administered 2014-12-14 – 2014-12-18 (×5): 30 mg via ORAL
  Filled 2014-12-14 (×5): qty 1

## 2014-12-14 NOTE — Progress Notes (Signed)
Dr. Mortimer Fries informed [place] and he must get consent from DSS for thoracentesis. MD stated that he wouldn't be able to get to it until tomorrow. Specials notified and order canceled.

## 2014-12-14 NOTE — Progress Notes (Signed)
Subjective:  Renal function improved to 2.77. Reports cough Able to get up to go to bathroom  Objective:  Vital signs in last 24 hours:  Temp:  [97.5 F (36.4 C)-98.1 F (36.7 C)] 97.5 F (36.4 C) (10/17 0846) Pulse Rate:  [66-76] 76 (10/17 0846) Resp:  [17-20] 17 (10/17 0846) BP: (140-161)/(71-106) 161/106 mmHg (10/17 0846) SpO2:  [95 %-100 %] 95 % (10/17 0846) Weight:  [90.311 kg (199 lb 1.6 oz)] 90.311 kg (199 lb 1.6 oz) (10/17 0433)  Weight change: 1.633 kg (3 lb 9.6 oz) Filed Weights   12/12/14 0500 12/13/14 0513 12/14/14 0433  Weight: 87.176 kg (192 lb 3 oz) 88.678 kg (195 lb 8 oz) 90.311 kg (199 lb 1.6 oz)    Intake/Output: I/O last 3 completed shifts: In: 9030 [P.O.:480; I.V.:1350] Out: 175 [Urine:175]   Intake/Output this shift:  Total I/O In: 120 [P.O.:120] Out: 300 [Urine:300]  Physical Exam: General: NAD  Head: Normocephalic, atraumatic. Moist oral mucosal membranes  Eyes: Anicteric  Neck: Supple, trachea midline  Lungs:  bilateral wheezing and crackles, normal effort  Heart: Regular rate and rhythm  Abdomen:  Soft, nontender, BS present  Extremities:  trace peripheral edema.  Neurologic: Nonfocal, moving all four extremities  Skin: No lesions       Basic Metabolic Panel:  Recent Labs Lab 12/09/14 0447 12/10/14 0402 12/11/14 0626 12/12/14 0453 12/13/14 0452 12/14/14 0626  NA 141 137 141 136 138  --   K 4.0 4.3 4.0 4.6 4.4  --   CL 107 104 109 106 107  --   CO2 25 26 24 23 24   --   GLUCOSE 124* 192* 95 263* 154*  --   BUN 43* 47* 45* 55* 59*  --   CREATININE 2.80* 3.02* 2.74* 3.11* 2.88* 2.77*  CALCIUM 7.8* 8.2* 8.2* 8.2* 8.2*  --     Liver Function Tests: No results for input(s): AST, ALT, ALKPHOS, BILITOT, PROT, ALBUMIN in the last 168 hours. No results for input(s): LIPASE, AMYLASE in the last 168 hours. No results for input(s): AMMONIA in the last 168 hours.  CBC:  Recent Labs Lab 12/08/14 1319 12/08/14 2049 12/09/14 0447  12/10/14 0402 12/13/14 0452  WBC 5.1 5.1 4.7 4.6 6.2  NEUTROABS 3.6  --  2.8  --   --   HGB 9.1* 9.5* 8.6* 9.4* 8.4*  HCT 28.2* 28.6* 27.0* 29.1* 25.5*  MCV 99.8 99.6 100.1* 100.4* 99.9  PLT 193 194 198 218 182    Cardiac Enzymes:  Recent Labs Lab 12/08/14 1319  TROPONINI 0.03    BNP: Invalid input(s): POCBNP  CBG:  Recent Labs Lab 12/13/14 0744 12/13/14 1140 12/13/14 1604 12/13/14 2109 12/14/14 0813  GLUCAP 143* 198* 297* 254* 44*    Microbiology: No results found for this or any previous visit.  Coagulation Studies: No results for input(s): LABPROT, INR in the last 72 hours.  Urinalysis: No results for input(s): COLORURINE, LABSPEC, PHURINE, GLUCOSEU, HGBUR, BILIRUBINUR, KETONESUR, PROTEINUR, UROBILINOGEN, NITRITE, LEUKOCYTESUR in the last 72 hours.  Invalid input(s): APPERANCEUR    Imaging: No results found.   Medications:     . aspirin EC  81 mg Oral Daily  . carvedilol  6.25 mg Oral BID  . cetirizine  10 mg Oral Daily  . diltiazem  120 mg Oral Daily  . FLUoxetine  10 mg Oral Daily  . heparin  5,000 Units Subcutaneous 3 times per day  . hydrALAZINE  25 mg Oral 4 times per day  .  insulin aspart  0-5 Units Subcutaneous QHS  . insulin aspart  0-9 Units Subcutaneous TID WC  . isosorbide mononitrate  30 mg Oral Daily  . levothyroxine  125 mcg Oral QAC breakfast  . methylPREDNISolone (SOLU-MEDROL) injection  40 mg Intravenous Daily  . multivitamin with minerals  1 tablet Oral Daily  . olopatadine  1 drop Both Eyes Daily  . oxybutynin  10 mg Oral Daily  . pantoprazole  40 mg Oral Daily  . sodium bicarbonate  650 mg Oral BID  . sodium chloride  3 mL Intravenous Q12H  . vitamin A & D  1 application Topical Q M,W,F  . Vitamin D (Ergocalciferol)  50,000 Units Oral Q7 days   sodium chloride, acetaminophen, haloperidol lactate, levalbuterol, ondansetron (ZOFRAN) IV, sodium chloride  Assessment/ Plan:  81 y.o. female  with a PMHX of chronic kidney  disease stage IV baseline EGFR 29 and creatinine of 1.7, diabetes mellitus type 2, dementia, hypertension, osteophytes, depression, who was admitted to [facility] on 12/08/2014 for evaluation of altered mental status. [facility] nephrology as outpt.  1. Acute renal failure/CKD stage IV baseline Cr 1.7 egfr 29. Acute renal failure likely secondary to altered cardiorenal hemodynamics. Chronic kidney disease is likely multifactorial with contributions from diabetes mellitus, hypertension, and age. Renal US shows small kidneys. SPEP negative.  [facility] nephrology as outpt. - Cr remains above baseline though is trending down slowly.   - sounding wet now. D/c iv fluids  2. Acute diastolic heart failure. Ejection fraction was found to be 45-50% however right-sided pressures were elevated.  -  Would continue to monitor for signs of respiratory distress.     3. Anemia of CKD: last dropped to 8.4, in part dilutional, given epogen [place] this admission   LOS: 6 Edel Rivero 10/17/201610:24 AM

## 2014-12-14 NOTE — Progress Notes (Signed)
SUBJECTIVE: no complaints today.  No pain.   Tele: atrial fib, rate 60s  BP 134/81 mmHg  Pulse 87  Temp(Src) 97.8 F (36.6 C) (Oral)  Resp 18  Ht '5\' 5"'$  (1.651 m)  Wt 199 lb 1.6 oz (90.311 kg)  BMI 33.13 kg/m2  SpO2 92%  Intake/Output Summary (Last 24 hours) at 12/14/14 1513 Last data filed at 12/14/14 1348  Gross per 24 hour  Intake   1180 ml  Output    475 ml  Net    705 ml    PHYSICAL EXAM General: Elderly female, well developed, well nourished, in no acute distress. Alert and oriented x 3.  Psych:  Good affect, responds appropriately Neck: No JVD. No masses noted.  Lungs: Decreased BS right base.  Heart: Irregular with no murmurs noted. Abdomen: Bowel sounds are present. Soft, non-tender.  Extremities: No lower extremity edema.   LABS: Basic Metabolic Panel:  Recent Labs  12/12/14 0453 12/13/14 0452 12/14/14 0626  NA 136 138  --   K 4.6 4.4  --   CL 106 107  --   CO2 23 24  --   GLUCOSE 263* 154*  --   BUN 55* 59*  --   CREATININE 3.11* 2.88* 2.77*  CALCIUM 8.2* 8.2*  --    CBC:  Recent Labs  12/13/14 0452  WBC 6.2  HGB 8.4*  HCT 25.5*  MCV 99.9  PLT 182   Current Meds: . aspirin EC  81 mg Oral Daily  . carvedilol  6.25 mg Oral BID  . cetirizine  10 mg Oral Daily  . diltiazem  120 mg Oral Daily  . FLUoxetine  10 mg Oral Daily  . heparin  5,000 Units Subcutaneous 3 times per day  . hydrALAZINE  25 mg Oral 4 times per day  . insulin aspart  0-5 Units Subcutaneous QHS  . insulin aspart  0-9 Units Subcutaneous TID WC  . isosorbide mononitrate  30 mg Oral Daily  . levothyroxine  125 mcg Oral QAC breakfast  . methylPREDNISolone (SOLU-MEDROL) injection  40 mg Intravenous Daily  . multivitamin with minerals  1 tablet Oral Daily  . olopatadine  1 drop Both Eyes Daily  . oxybutynin  10 mg Oral Daily  . pantoprazole  40 mg Oral Daily  . sodium bicarbonate  650 mg Oral BID  . sodium chloride  3 mL Intravenous Q12H  . vitamin A & D  1  application Topical Q M,W,F  . Vitamin D (Ergocalciferol)  50,000 Units Oral Q7 days     ASSESSMENT AND PLAN: 81 y.o. female with a history of metastatic breast cancer status post right-sided mastectomy with chemotherapy and radiation, currently negative for malignancy, history of diabetes, hypertension, hypothyroidism who lives at [facility].She was admitted 12/10/14 with 3-4 days of worsening shortness of breath, weakness. She was found to be in atrial fibrillation in the ED.   1) Acute respiratory distress, hypoxia: Initially felt to be from CHF but with diuresis her renal function worsened and diuretics have been held. She was found to have a moderate sized right pleural effusion. Plans for thoracentesis per Pulmonary team team. LV systolic function is mildly depressed.   2) Atrial fibrillation: Unknown duration/onset. May have had chronic atrial fibrillation. Rate is controlled with Coreg and Cardizem CD 120 mg daily. Not a good candidate for anticoagulation given her anemia, advanced age, CKD and high fall risk. Family confirms she has had recent fall.  3) Chronic kidney disease, stage IV: Nephrology following.  4. Acute diastolic CHF: Weight is slowly increasing. Renal function is improving. Diuretics are being held.  Will sign off. Please call with questions.     Paige James  12/14/2014  3:13 PM

## 2014-12-14 NOTE — Progress Notes (Signed)
[place] at [place] NAME: Paige James    MR#:  [id number]  DATE OF BIRTH:  10/02/1920  SUBJECTIVE:  CHIEF COMPLAINT:   Chief Complaint  Patient presents with  . Altered Mental Status   the patient is a 81 year old female with history of metastatic breast cancer status post right-sided mastectomy, diabetes, hypertension, hypothyroidism who presents to the hospital with 3 or 4 days of shortness of breath and weakness. She was found to be in A. Fib. Echo revealed ejection fraction of 45% to 50%. Her chest x-ray revealed bilateral pleural effusions, more on the right, diuresis was tried, however, patient's kidney function worsened. Diuretics were placed on hold and patient received IV fluids with improvement of her kidney function. Respiratory status remained stable despite IV fluid administration. Patient's wheezing has subsided with steroids and antibiotics. Remains on 2 L of oxygen through nasal cannula   REVIEW OF SYSTEMS:    Review of Systems  Constitutional: Negative for fever and chills.  HENT: Negative for congestion and tinnitus.   Eyes: Negative for blurred vision and double vision.  Respiratory: Positive for wheezing. Negative for cough and shortness of breath.   Cardiovascular: Negative for chest pain, orthopnea and PND.  Gastrointestinal: Negative for nausea, vomiting, abdominal pain and diarrhea.  Genitourinary: Negative for dysuria and hematuria.  Neurological: Positive for weakness (generalized). Negative for dizziness, sensory change and focal weakness.  All other systems reviewed and are negative.   Nutrition: Renal heart healthy Tolerating Diet: Yes Tolerating PT: Yes   DRUG ALLERGIES:  No Known Allergies  VITALS:  Blood pressure 161/106, pulse 76, temperature 97.5 F (36.4 C), temperature source Oral, resp. rate 17, height '5\' 5"'$  (1.651 m), weight 90.311 kg (199 lb 1.6 oz), SpO2 95 %.  PHYSICAL EXAMINATION:    Physical Exam  GENERAL:  81 y.o.-year-old patient lying in the bed in no acute distress. Comfortable,  intermittent wheezing. No significant shortness of breath EYES: Pupils equal, round, reactive to light and accommodation. No scleral icterus. Extraocular muscles intact.  HEENT: Head atraumatic, normocephalic. Oropharynx and nasopharynx clear.  NECK:  Supple, no jugular venous distention. No thyroid enlargement, no tenderness.  LUNGS: No evidence of accessory muscles. Diminished breath sounds bilaterally at bases, however, no wheezing. Rales, rhonchi or crepitations. Breathing is calm CARDIOVASCULAR: S1, S2 , regular. No murmurs, rubs, or gallops.  ABDOMEN: Soft, nontender, nondistended. Bowel sounds present. No organomegaly or mass.  EXTREMITIES: No cyanosis, clubbing or edema b/l.    NEUROLOGIC: Cranial nerves II through XII are intact. No focal Motor or sensory deficits b/l.   PSYCHIATRIC: The patient is alert and oriented x 1.  SKIN: No obvious rash, lesion, or ulcer.    LABORATORY PANEL:   CBC  Recent Labs Lab 12/13/14 0452  WBC 6.2  HGB 8.4*  HCT 25.5*  PLT 182   ------------------------------------------------------------------------------------------------------------------  Chemistries   Recent Labs Lab 12/13/14 0452 12/14/14 0626  NA 138  --   K 4.4  --   CL 107  --   CO2 24  --   GLUCOSE 154*  --   BUN 59*  --   CREATININE 2.88* 2.77*  CALCIUM 8.2*  --    ------------------------------------------------------------------------------------------------------------------  Cardiac Enzymes  Recent Labs Lab 12/08/14 1319  TROPONINI 0.03   ------------------------------------------------------------------------------------------------------------------  RADIOLOGY:  No results found.   ASSESSMENT AND PLAN:   81 year old female with past medical history of chronic kidney disease stage III, hypertension, type 2 diabetes without,  condition, dementia,  osteoarthritis, hypothyroidism who presented to the hospital due to shortness of breath and also mental status.  #1 altered mental status-this is likely multifactorial and related to underlying dementia with sundowning and also complicated with underlying renal failure. -Mental status has improved . No agitation . Patient received Haldol few days ago. Avoiding benzodiazepines.-Continue to follow mental status, overall stable  #2 acute respiratory failure with hypoxia- secondary to CHF, also underlying acute bronchitis/COPD exacerbation. -CT chest showing a moderate right-sided right pleural effusion and a small left pleural effusion and a small pulmonary nodule. Appreciate pulmonary consult , patient would benefit from diuresis. However, due to poor kidney function . We are not using diuretics at this time.    -Holding  Lasix given renal failure. Tapering steroids, finished Z-Pak. Clinically stable, weaning him off oxygen. Possible reinitiation diuretics if kidney function remains stable.   #3 acute bronchitis with COPD exacerbation-continue Xopenex nebs, Zithromax. Tapering steroids. Patient is clinically improving.  CT chest yesterday showing no airway disease but evidence of bilateral pleural effusions more on the right than the left. Pulmonary consult is appreciated, family cannot make decisions about thoracentesis, as patient belongs to [employer].  CHF however is likely reason for pleural effusions.   #4 CHF-acute on chronic combined systolic- diastolic dysfunction. -Patient's echocardiogram showed mild LV dysfunction with EF of 45%. -Appreciate cardiology input. Cont. Coreg. Holding Lasix given poor renal function.   #5 new-onset atrial fibrillation, now in sinus rhythm-rate controlled and continue Coreg, Cardizem. -Given her advanced age and high fall risk she is likely not a long-term anticoagulation candidate.  -Continue aspirin. Appreciate cardiology input  #6 . Acute on chronic renal failure  with history of chronic kidney disease stage III-IV-patient likely has chronic kidney disease secondary to diabetes hypertension/age.  Kidney function is better today with conservative therapy. Patient is off diuretics-follow BUN and creatinine and urine output.-Appreciate nephrology consult. Renal US showing no evidence of obstruction but evidence of medical renal disease. Stopping IV fluids and following Patient's kidney function tomorrow morning  #7 depression-continue Prozac  #8 urinary incontinence-continue oxybutynin.  #9 GERD-continue Protonix.  #10 hypothyroidism-continue Synthroid. Patient's TSH is slightly elevated. We'll increase her Synthroid dose slightly upon discharge  Await PT Eval.    All the records are reviewed and case discussed with Care Management/Social Workerr. Management plans discussed with the patient, family and they are in agreement.  CODE STATUS: DO NOT RESUSCITATE  DVT Prophylaxis: Heparin subcutaneous  TOTAL TIME TAKING CARE OF THIS PATIENT: 9mnutes.  Discussed with care management   .  VTheodoro GristM.D on 12/14/2014 at 12:13 PM  Between 7am to 6pm - Pager - [phone]  After 6pm go to [email or web address] - password [email or web address] [facility] EManuel GarciaHospitalists  Office  [phone] CC: Primary care physician; No primary care provider on file.

## 2014-12-14 NOTE — Progress Notes (Signed)
Physical Therapy Treatment Patient Details Name: Paige James MRN: [id number] DOB: [birth year] Today's Date: 12/14/2014    History of Present Illness Pt is a 81 y.o. female presenting to hospital with AMS, hypoxia, and new onset a-fib.  PMH includes dementia, metastatic breast CA s/p R mastectomy with chemotherapy and radiation, s/p B TKR.    PT Comments    Pt tolerating treatment session well, motivated and able to complete entire PT sesssion as planned, however notably exhausted after session is over. Pt continues to making limited progress toward goals as evidenced by improved ambulation distance, however pt's daughter states that she appears much better than when she first came in. Pt's greatest limitation continues to be generalized weakness in LE, and poor tolerance to activity, which continue to limit ability to perform mobility tasks at baseline function. Patient presenting with impairment of strength, balance, oxygen perfusion, and activity tolerance, limiting ability to perform ADL and mobility tasks at  baseline level of function. Patient will benefit from skilled intervention to address the above impairments and limitations, in order to restore to prior level of function, improve patient safety upon discharge, and to decrease caregiver burden.    Follow Up Recommendations  Home health PT;Supervision for mobility/OOB     Equipment Recommendations  Rolling walker with 5" wheels    Recommendations for Other Services       Precautions / Restrictions Precautions Precautions: None Restrictions Weight Bearing Restrictions: No    Mobility  Bed Mobility Overal bed mobility: Needs Assistance Bed Mobility: Supine to Sit;Sit to Supine;Rolling Rolling: Supervision   Supine to sit: Supervision Sit to supine: Supervision   General bed mobility comments: Able to assist with scoot toward HOB with VC.   Transfers Overall transfer level: Needs assistance Equipment used: Rolling  walker (2 wheeled) Transfers: Sit to/from Stand Sit to Stand: From elevated surface;Min assist         General transfer comment: Requires cues to perform with productive forward lean, but is able to replicate 46T with breaks. Increased O2 2L->3L as pt desatting with activity on 2L. *see vitals  Ambulation/Gait Ambulation/Gait assistance: Min assist Ambulation Distance (Feet): 60 Feet Assistive device: Rolling walker (2 wheeled)     Gait velocity interpretation: <1.8 ft/sec, indicative of risk for recurrent falls General Gait Details: 96f over 3 bouts with seated rest breaks for recovery, pt reporting to be outof breath. Pt does well walking forward req only min VC for safe RW distance, but requires detailed instructions for sequencing of 3 point gait for retro AMB with RW. Pt is able to retain VC for subsequent bouts without reminder.  Impullsively tries to sit each time due to exhaustion.    Stairs            Wheelchair Mobility    Modified Rankin (Stroke Patients Only)       Balance Overall balance assessment: Needs assistance Sitting-balance support: No upper extremity supported;Feet supported Sitting balance-Leahy Scale: Poor (sitts slumped forwardwith arms on knees. )     Standing balance support: Bilateral upper extremity supported Standing balance-Leahy Scale: Fair                      Cognition Arousal/Alertness: Awake/alert (Appears exhausted, worse with activity. ) Behavior During Therapy: WFL for tasks assessed/performed Overall Cognitive Status: Within Functional Limits for tasks assessed       Memory:  (Good recal of VC between bouts. )  Exercises      General Comments        Pertinent Vitals/Pain Pain Assessment: No/denies pain    Home Living                      Prior Function            PT Goals (current goals can now be found in the care plan section) Acute Rehab PT Goals Patient Stated Goal: to  get rid of "cold" PT Goal Formulation: With patient Time For Goal Achievement: 12/23/14 Potential to Achieve Goals: Good Progress towards PT goals: Not progressing toward goals - comment;PT to reassess next treatment    Frequency  Min 2X/week    PT Plan Current plan remains appropriate    Co-evaluation             End of Session Equipment Utilized During Treatment: Gait belt;Oxygen Activity Tolerance: Patient tolerated treatment well;Patient limited by fatigue Patient left: with call bell/phone within reach;in bed;with family/visitor present;with bed alarm set     Time: 9276-3943 PT Time Calculation (min) (ACUTE ONLY): 27 min  Charges:  $Gait Training: 8-22 mins $Therapeutic Activity: 8-22 mins                    G Codes:      Carmen Tolliver C Jan 03, 2015, 6:42 PM 6:44 PM  Etta Grandchild, PT, DPT [place] License # [id number]

## 2014-12-14 NOTE — Progress Notes (Signed)
Nutrition Follow-up   INTERVENTION:   Meals and Snacks: Cater to patient preferences. Recommend heart healthy/Carb modified diet order as pt not requiring HD currently and with h/o DM with elevated blood sugars, with fluid restriction as needed. Medical Food Supplement Therapy: Continue as ordered   NUTRITION DIAGNOSIS:   Inadequate oral intake related to acute illness as evidenced by meal completion < 50%.  GOAL:   Patient will meet greater than or equal to 90% of their needs  MONITOR:    (Energy Intake, Electrolyte and Renal Profile, Anthropometrics, Glucose Profile)  REASON FOR ASSESSMENT:   Diagnosis    ASSESSMENT:   Pt admitted with AMS, per MD note with new onset afib, acute renal fialure, acute on chronic CHF and h/o DM. Pt from [place]. Pt also with h/o metastatic breast cancer s/p mastectomy with chemotherapy and radiation.  Pt continues to be confused resting on visit this afternoon. Per MD note, pt scheduled for thoracentesis however pt family cannot sign consent as pt belongs to DSS, and will be scheduled for tomorrow. Per MD note, renal function improved, IVF discontinued.   Diet Order:  Diet renal with fluid restriction Fluid restriction:: 1200 mL Fluid; Room service appropriate?: Yes; Fluid consistency:: Thin    Current Nutrition: Pt eating 95% of meals on average for the past 4 days. CNA Earlene Plater confirms pt eating very well today with a good appetite.    Gastrointestinal Profile: Last BM: 12/12/2014   Scheduled Medications:  . aspirin EC  81 mg Oral Daily  . carvedilol  6.25 mg Oral BID  . cetirizine  10 mg Oral Daily  . diltiazem  120 mg Oral Daily  . FLUoxetine  10 mg Oral Daily  . heparin  5,000 Units Subcutaneous 3 times per day  . hydrALAZINE  25 mg Oral 4 times per day  . insulin aspart  0-5 Units Subcutaneous QHS  . insulin aspart  0-9 Units Subcutaneous TID WC  . isosorbide mononitrate  30 mg Oral Daily  . levothyroxine   125 mcg Oral QAC breakfast  . methylPREDNISolone (SOLU-MEDROL) injection  40 mg Intravenous Daily  . multivitamin with minerals  1 tablet Oral Daily  . olopatadine  1 drop Both Eyes Daily  . oxybutynin  10 mg Oral Daily  . pantoprazole  40 mg Oral Daily  . sodium bicarbonate  650 mg Oral BID  . sodium chloride  3 mL Intravenous Q12H  . vitamin A & D  1 application Topical Q M,W,F  . Vitamin D (Ergocalciferol)  50,000 Units Oral Q7 days     Electrolyte/Renal Profile and Glucose Profile:   Recent Labs Lab 12/11/14 0626 12/12/14 0453 12/13/14 0452 12/14/14 0626  NA 141 136 138  --   K 4.0 4.6 4.4  --   CL 109 106 107  --   CO2 '24 23 24  '$ --   BUN 45* 55* 59*  --   CREATININE 2.74* 3.11* 2.88* 2.77*  CALCIUM 8.2* 8.2* 8.2*  --   GLUCOSE 95 263* 154*  --    Protein Profile: No results for input(s): ALBUMIN in the last 168 hours.    Weight Trend since Admission: Filed Weights   12/12/14 0500 12/13/14 0513 12/14/14 0433  Weight: 192 lb 3 oz (87.176 kg) 195 lb 8 oz (88.678 kg) 199 lb 1.6 oz (90.311 kg)     Skin:  Reviewed, no issues   BMI:  Body mass index is 33.13 kg/(m^2).  Estimated Nutritional Needs:  Kcal:  BEE: 978kcals, TEE: (IF 1.1-1.3)(AF 1.2) 1293-1527kcals, using IBW of 56.8kg  Protein:  57-68g protein (1.0-1.2g/kg) using IBW of 56.8kg  Fluid:  1420-1760m of fluid (25-372mkg) using IBW of 56.8kg, note 1200109mluid restriction currently  EDUCATION NEEDS:   Education needs no appropriate at this time   LOWTyroneD, LDN Pager [phone]

## 2014-12-14 NOTE — Progress Notes (Signed)
Pt. Rested through out the night with no c/o pain or discomfort. VSS. Will continue to monitor pt.  Consent for thoracentesis signed and in chart.

## 2014-12-14 NOTE — Progress Notes (Signed)
Notified PA Dunn of asymptomatic 2sec pause, no new orders at this time.

## 2014-12-15 ENCOUNTER — Inpatient Hospital Stay: Payer: Medicare Other

## 2014-12-15 LAB — BASIC METABOLIC PANEL
Anion gap: 6 (ref 5–15)
BUN: 76 mg/dL — AB (ref 6–20)
CALCIUM: 8.4 mg/dL — AB (ref 8.9–10.3)
CO2: 23 mmol/L (ref 22–32)
CREATININE: 3.01 mg/dL — AB (ref 0.44–1.00)
Chloride: 107 mmol/L (ref 101–111)
GFR calc Af Amer: 14 mL/min — ABNORMAL LOW (ref >60)
GFR, EST NON AFRICAN AMERICAN: 13 mL/min — AB (ref >60)
Glucose, Bld: 249 mg/dL — ABNORMAL HIGH (ref 65–99)
Potassium: 5.3 mmol/L — ABNORMAL HIGH (ref 3.5–5.1)
SODIUM: 136 mmol/L (ref 135–145)

## 2014-12-15 LAB — GLUCOSE, CAPILLARY
GLUCOSE-CAPILLARY: 213 mg/dL — AB (ref 65–99)
GLUCOSE-CAPILLARY: 340 mg/dL — AB (ref 65–99)
Glucose-Capillary: 277 mg/dL — ABNORMAL HIGH (ref 65–99)
Glucose-Capillary: 315 mg/dL — ABNORMAL HIGH (ref 65–99)

## 2014-12-15 MED ORDER — METHYLPREDNISOLONE 4 MG PO TBPK
4.0000 mg | ORAL_TABLET | ORAL | Status: AC
  Administered 2014-12-15: 4 mg via ORAL

## 2014-12-15 MED ORDER — METHYLPREDNISOLONE 4 MG PO TBPK
8.0000 mg | ORAL_TABLET | Freq: Every morning | ORAL | Status: AC
  Administered 2014-12-15: 8 mg via ORAL
  Filled 2014-12-15: qty 21

## 2014-12-15 MED ORDER — METHYLPREDNISOLONE 4 MG PO TBPK
4.0000 mg | ORAL_TABLET | Freq: Three times a day (TID) | ORAL | Status: AC
  Administered 2014-12-16 (×3): 4 mg via ORAL

## 2014-12-15 MED ORDER — METHYLPREDNISOLONE 4 MG PO TBPK
8.0000 mg | ORAL_TABLET | Freq: Every evening | ORAL | Status: AC
  Administered 2014-12-16: 8 mg via ORAL

## 2014-12-15 MED ORDER — METHYLPREDNISOLONE 4 MG PO TBPK
4.0000 mg | ORAL_TABLET | Freq: Four times a day (QID) | ORAL | Status: DC
  Administered 2014-12-17 – 2014-12-18 (×6): 4 mg via ORAL

## 2014-12-15 MED ORDER — METHYLPREDNISOLONE 4 MG PO TBPK
8.0000 mg | ORAL_TABLET | Freq: Every evening | ORAL | Status: AC
  Administered 2014-12-15: 8 mg via ORAL

## 2014-12-15 NOTE — Progress Notes (Signed)
Patient remained hemodynamically stable overnight. Patient was afib at the beginning of the shift, but converted to NSR with first degree HB. At [place].

## 2014-12-15 NOTE — Progress Notes (Signed)
Subjective:  Serum creatinine still high at 3.01 No acute SOB   Objective:  Vital signs in last 24 hours:  Temp:  [97.8 F (36.6 C)-98.8 F (37.1 C)] 97.8 F (36.6 C) (10/18 0826) Pulse Rate:  [66-87] 67 (10/18 0826) Resp:  [17-18] 17 (10/18 0826) BP: (134-154)/(67-81) 154/78 mmHg (10/18 0826) SpO2:  [86 %-100 %] 98 % (10/18 0826) Weight:  [88.406 kg (194 lb 14.4 oz)] 88.406 kg (194 lb 14.4 oz) (10/18 0535)  Weight change: -1.905 kg (-4 lb 3.2 oz) Filed Weights   12/13/14 0513 12/14/14 0433 12/15/14 0535  Weight: 88.678 kg (195 lb 8 oz) 90.311 kg (199 lb 1.6 oz) 88.406 kg (194 lb 14.4 oz)    Intake/Output: I/O last 3 completed shifts: In: 1330 [P.O.:480; I.V.:850] Out: 675 [Urine:675]   Intake/Output this shift:  Total I/O In: 240 [P.O.:240] Out: 350 [Urine:350]  Physical Exam: General: NAD  Head: Normocephalic, atraumatic. Moist oral mucosal membranes  Eyes: Anicteric  Neck: Supple, trachea midline  Lungs:  Mild crackles, normal effort  Heart: No rub  Abdomen:  Soft, nontender, BS present  Extremities:  trace peripheral edema.  Neurologic: Nonfocal, moving all four extremities  Skin: No lesions       Basic Metabolic Panel:  Recent Labs Lab 12/10/14 0402 12/11/14 0626 12/12/14 0453 12/13/14 0452 12/14/14 0626 12/15/14 0455  NA 137 141 136 138  --  136  K 4.3 4.0 4.6 4.4  --  5.3*  CL 104 109 106 107  --  107  CO2 26 24 23 24   --  23  GLUCOSE 192* 95 263* 154*  --  249*  BUN 47* 45* 55* 59*  --  76*  CREATININE 3.02* 2.74* 3.11* 2.88* 2.77* 3.01*  CALCIUM 8.2* 8.2* 8.2* 8.2*  --  8.4*    Liver Function Tests: No results for input(s): AST, ALT, ALKPHOS, BILITOT, PROT, ALBUMIN in the last 168 hours. No results for input(s): LIPASE, AMYLASE in the last 168 hours. No results for input(s): AMMONIA in the last 168 hours.  CBC:  Recent Labs Lab 12/08/14 1319 12/08/14 2049 12/09/14 0447 12/10/14 0402 12/13/14 0452  WBC 5.1 5.1 4.7 4.6 6.2   NEUTROABS 3.6  --  2.8  --   --   HGB 9.1* 9.5* 8.6* 9.4* 8.4*  HCT 28.2* 28.6* 27.0* 29.1* 25.5*  MCV 99.8 99.6 100.1* 100.4* 99.9  PLT 193 194 198 218 182    Cardiac Enzymes:  Recent Labs Lab 12/08/14 1319  TROPONINI 0.03    BNP: Invalid input(s): POCBNP  CBG:  Recent Labs Lab 12/13/14 2109 12/14/14 0813 12/14/14 1653 12/14/14 2132 12/15/14 0803  GLUCAP 254* 244* 310* 256* 213*    Microbiology: No results found for this or any previous visit.  Coagulation Studies: No results for input(s): LABPROT, INR in the last 72 hours.  Urinalysis: No results for input(s): COLORURINE, LABSPEC, PHURINE, GLUCOSEU, HGBUR, BILIRUBINUR, KETONESUR, PROTEINUR, UROBILINOGEN, NITRITE, LEUKOCYTESUR in the last 72 hours.  Invalid input(s): APPERANCEUR    Imaging: Dg Chest Port 1 View  12/15/2014  CLINICAL DATA:  Dyspnea EXAM: PORTABLE CHEST 1 VIEW COMPARISON:  12/10/2014 FINDINGS: Cardiomegaly. There is small right pleural effusion with right lower lobe atelectasis or infiltrate. Mild left basilar atelectasis. No pulmonary edema. IMPRESSION: Small right pleural effusion with right lower lobe atelectasis or infiltrate. Mild left basilar atelectasis. No pulmonary edema. Electronically Signed   By: Lahoma Crocker M.D.   On: 12/15/2014 08:56     Medications:     .  aspirin EC  81 mg Oral Daily  . carvedilol  6.25 mg Oral BID  . cetirizine  10 mg Oral Daily  . diltiazem  120 mg Oral Daily  . FLUoxetine  10 mg Oral Daily  . heparin  5,000 Units Subcutaneous 3 times per day  . hydrALAZINE  25 mg Oral 4 times per day  . insulin aspart  0-5 Units Subcutaneous QHS  . insulin aspart  0-9 Units Subcutaneous TID WC  . isosorbide mononitrate  30 mg Oral Daily  . levothyroxine  125 mcg Oral QAC breakfast  . methylPREDNISolone (SOLU-MEDROL) injection  40 mg Intravenous Daily  . multivitamin with minerals  1 tablet Oral Daily  . olopatadine  1 drop Both Eyes Daily  . oxybutynin  10 mg Oral  Daily  . pantoprazole  40 mg Oral Daily  . sodium bicarbonate  650 mg Oral BID  . sodium chloride  3 mL Intravenous Q12H  . vitamin A & D  1 application Topical Q M,W,F  . Vitamin D (Ergocalciferol)  50,000 Units Oral Q7 days   sodium chloride, acetaminophen, haloperidol lactate, levalbuterol, ondansetron (ZOFRAN) IV, sodium chloride  Assessment/ Plan:  81 y.o. female  with a PMHX of chronic kidney disease stage IV baseline EGFR 29 and creatinine of 1.7, diabetes mellitus type 2, dementia, hypertension, osteophytes, depression, who was admitted to [facility] on 12/08/2014 for evaluation of altered mental status. [facility] nephrology as outpt.  1. Acute renal failure/CKD stage IV baseline Cr 1.7 egfr 29. Acute renal failure likely secondary to altered cardiorenal hemodynamics. Chronic kidney disease is likely multifactorial with contributions from diabetes mellitus, hypertension, and age. Renal US shows small kidneys. SPEP negative.  [facility] nephrology as outpt. - Cr remains above baseline though is trending down slowly.   - monitor  2. Acute diastolic heart failure. Ejection fraction was found to be 45-50% however right-sided pressures were elevated.  -  Would continue to monitor for signs of respiratory distress.     3. Anemia of CKD: last dropped to 8.4, in part dilutional, given epogen [place] this admission   LOS: 7 Paige James 10/18/201612:15 PM

## 2014-12-15 NOTE — Progress Notes (Signed)
[place] at [place] NAME: Paige James    MR#:  [id number]  DATE OF BIRTH:  10-Jul-1920  SUBJECTIVE:  CHIEF COMPLAINT:   Chief Complaint  Patient presents with  . Altered Mental Status   the patient is a 81 year old female with history of metastatic breast cancer status post right-sided mastectomy, diabetes, hypertension, hypothyroidism who presents to the hospital with 3 or 4 days of shortness of breath and weakness. She was found to be in A. Fib. Echo revealed ejection fraction of 45% to 50%. Her chest x-ray revealed bilateral pleural effusions, more on the right, diuresis was tried, however, patient's kidney function worsened. Diuretics were placed on hold and patient received IV fluids with improvement of her kidney function. Now her IV fluids have been stopped and her kidney function has worsened a little since yesterday.  Respiratory status remained stable despite IV fluid administration. Patient's wheezing has subsided with steroids and antibiotics. Remains on 2 L of oxygen through nasal cannula   REVIEW OF SYSTEMS:    Review of Systems  Constitutional: Negative for fever and chills.  HENT: Negative for congestion and tinnitus.   Eyes: Negative for blurred vision and double vision.  Respiratory: Positive for wheezing. Negative for cough and shortness of breath.   Cardiovascular: Negative for chest pain, orthopnea and PND.  Gastrointestinal: Negative for nausea, vomiting, abdominal pain and diarrhea.  Genitourinary: Negative for dysuria and hematuria.  Neurological: Positive for weakness (generalized). Negative for dizziness, sensory change and focal weakness.  All other systems reviewed and are negative.   Nutrition: Renal heart healthy Tolerating Diet: Yes Tolerating PT: Yes   DRUG ALLERGIES:  No Known Allergies  VITALS:  Blood pressure 154/78, pulse 67, temperature 97.8 F (36.6 C), temperature source Oral, resp. rate  17, height '5\' 5"'$  (1.651 m), weight 88.406 kg (194 lb 14.4 oz), SpO2 98 %.  PHYSICAL EXAMINATION:   Physical Exam  GENERAL:  81 y.o.-year-old patient lying in the bed in no acute distress. Comfortable,  intermittent wheezing. No significant shortness of breath EYES: Pupils equal, round, reactive to light and accommodation. No scleral icterus. Extraocular muscles intact.  HEENT: Head atraumatic, normocephalic. Oropharynx and nasopharynx clear.  NECK:  Supple, no jugular venous distention. No thyroid enlargement, no tenderness.  LUNGS: No evidence of accessory muscles. Diminished breath sounds bilaterally at bases, however, no wheezing. Rales, rhonchi or crepitations. Breathing is calm CARDIOVASCULAR: S1, S2 , regular. No murmurs, rubs, or gallops.  ABDOMEN: Soft, nontender, nondistended. Bowel sounds present. No organomegaly or mass.  EXTREMITIES: No cyanosis, clubbing or edema b/l.    NEUROLOGIC: Cranial nerves II through XII are intact. No focal Motor or sensory deficits b/l.   PSYCHIATRIC: The patient is alert and oriented x 1.  SKIN: No obvious rash, lesion, or ulcer.    LABORATORY PANEL:   CBC  Recent Labs Lab 12/13/14 0452  WBC 6.2  HGB 8.4*  HCT 25.5*  PLT 182   ------------------------------------------------------------------------------------------------------------------  Chemistries   Recent Labs Lab 12/15/14 0455  NA 136  K 5.3*  CL 107  CO2 23  GLUCOSE 249*  BUN 76*  CREATININE 3.01*  CALCIUM 8.4*   ------------------------------------------------------------------------------------------------------------------  Cardiac Enzymes  Recent Labs Lab 12/08/14 1319  TROPONINI 0.03   ------------------------------------------------------------------------------------------------------------------  RADIOLOGY:  Dg Chest Port 1 View  12/15/2014  CLINICAL DATA:  Dyspnea EXAM: PORTABLE CHEST 1 VIEW COMPARISON:  12/10/2014 FINDINGS: Cardiomegaly. There is  small right pleural effusion with right lower lobe  atelectasis or infiltrate. Mild left basilar atelectasis. No pulmonary edema. IMPRESSION: Small right pleural effusion with right lower lobe atelectasis or infiltrate. Mild left basilar atelectasis. No pulmonary edema. Electronically Signed   By: Lahoma Crocker M.D.   On: 12/15/2014 08:56     ASSESSMENT AND PLAN:   81 year old female with past medical history of chronic kidney disease stage III, hypertension, type 2 diabetes without, condition, dementia, osteoarthritis, hypothyroidism who presented to the hospital due to shortness of breath and also mental status.  #1 altered mental status-this is likely multifactorial and related to underlying dementia with sundowning and also complicated with underlying renal failure, metabolic encephalopathy. -Mental status has improved . No agitation . Patient received Haldol , but did not require any medications recently. Avoiding benzodiazepines.-Continue to follow mental status, overall stable  #2 acute respiratory failure with hypoxia- secondary to CHF, also underlying acute bronchitis/COPD exacerbation. -CT chest showing a moderate right-sided right pleural effusion and a small left pleural effusion and a small pulmonary nodule. Appreciate pulmonary consult , patient would benefit from diuresis. However, due to poor kidney function . We are not using diuretics at this time.    -Holding  Lasix given renal failure. Tapering steroids, finished Z-Pak. Clinically stable, weaning him off oxygen. Possible reinitiation diuretics if kidney function remains stable.   #3 acute bronchitis with COPD exacerbation-continue Xopenex nebs, Zithromax. Tapering steroids. Patient is clinically improving.  CT chest yesterday showing no airway disease but evidence of bilateral pleural effusions more on the right than the left. Pulmonary consult is appreciated, family cannot make decisions about thoracentesis, as patient belongs to DSS.   CHF however is likely reason for pleural effusions.   #4 CHF-acute on chronic combined systolic- diastolic dysfunction. -Patient's echocardiogram showed mild LV dysfunction with EF of 45%. -Appreciate cardiology input. Cont. Coreg. Holding Lasix given poor renal function.   #5 new-onset atrial fibrillation, now in sinus rhythm-rate controlled and continue Coreg, Cardizem. -Given her advanced age and high fall risk she is likely not a long-term anticoagulation candidate.  -Continue aspirin. Appreciate cardiology input  #6 . Acute on chronic renal failure with history of chronic kidney disease stage III-IV-patient likely has chronic kidney disease secondary to diabetes hypertension/age.  Kidney function is worse today after stopping IV fluids yesterday. Patient is off diuretics-follow BUN and creatinine and urine output.-Appreciate nephrology consult. Renal US showing no evidence of obstruction but evidence of medical renal disease.  #7 depression-continue Prozac  #8 urinary incontinence-continue oxybutynin.  #9 GERD-continue Protonix.  #10 hypothyroidism-continue Synthroid. Patient's TSH is slightly elevated. We'll increase her Synthroid dose slightly upon discharge  Await PT Eval.    All the records are reviewed and case discussed with Care Management/Social Workerr. Management plans discussed with the patient, family and they are in agreement.  CODE STATUS: DO NOT RESUSCITATE  DVT Prophylaxis: Heparin subcutaneous  TOTAL TIME TAKING CARE OF THIS PATIENT: 65mnutes.  Discussed with care management   .  VTheodoro GristM.D on 12/15/2014 at 12:22 PM  Between 7am to 6pm - Pager - [phone]  After 6pm go to [email or web address] - password [email or web address] [email or web address]  Office  [phone] CC: Primary care physician; No primary care provider on file.

## 2014-12-15 NOTE — Clinical Social Work Note (Signed)
Paige James with [facility] came by to see patient this morning. CSW updated Paige James regarding physician's wanting to monitor her kidney function as well as the episode last week in which patient acted out because she wanted a hamburger. Paige James at [place] stated that she had no concerns about taking her even with the episode that occurred. CSW will facilitate discharge when time. Paige James MSW,LCSW [phone]

## 2014-12-15 NOTE — Care Management Important Message (Signed)
Important Message  Patient Details  Name: Paige James MRN: [id number] Date of Birth: 1920-06-10   Medicare Important Message Given:  Yes-fourth notification given    Juliann Pulse A Allmond 12/15/2014, 10:07 AM

## 2014-12-15 NOTE — Progress Notes (Signed)
[facility] Consultation      Assessment and Plan:  Acute hypoxic respiratory failure with right-sided pleural effusion. -The patient appears significantly improved today, her dyspnea is nearly resolved. She is currently satting at 100% on 2 L nasal cannula. -She does not appear to be in acute respiratory distress, any longer, and her pleural effusion would likely occur if drained at this time. We'll therefore defer thoracentesis at this time. She can follow up with [place] outpatient if he develops further respiratory difficulty and could potentially be referred for thoracentesis at that time. The patient also apparently has a court appointed guardian would need to give consent.  Right upper lobe lung nodule. -This appears to be stable, to minimally enlarged from previous CAT scan from 06/03/2013. Likelihood is that this represents granulomatous disease or a very slowly growing indolent lung cancer. Given her comorbidities and advanced age and poor functional status, would not recommend biopsy at this time.  Acute systolic congestive heart failure. -Echocardiogram showed ejection fraction of 45-50%, diuresis has been limited by renal function. -Doubt significant infectious component, or bronchoconstriction. Can wean down steroids, no need for antibiotics for respiratory standpoint.  Will sign off for now, please call if needed.  Date: 12/15/2014  MRN# [id number] Paige James 1920-12-09    HPI:  The patient is resting comfortably. No new complaints today. Her breathing is considerably more comfortable than in previous days.  Echocardiogram results from 12/08/2014 reviewed. -Ejection fraction was 45-50%. -Pulmonary systolic pressure was 35-57 mmHg. -Atrial fibrillation.    Allergies:  Review of patient's allergies indicates no known allergies.  Review of Systems: Gen:  Denies  fever, sweats, chills HEENT: Denies blurred vision, double vision. Cvc:  No  dizziness, chest pain. Resp:   Denies cough or sputum porduction,  Gi: Denies swallowing difficulty, Gu:  Denies bladder incontinence, Ext:   No Joint pain, stiffness. Skin: No skin rash,  hives Endoc:  No polyuria, polydipsia. Psych: No depression, insomnia. Other:  All other systems were reviewed with the patient and were negative other that what is mentioned in the HPI.   Physical Examination:   VS: BP 125/68 mmHg  Pulse 57  Temp(Src) 97.4 F (36.3 C) (Axillary)  Resp 15  Ht '5\' 5"'$  (1.651 m)  Wt 194 lb 14.4 oz (88.406 kg)  BMI 32.43 kg/m2  SpO2 100%  General Appearance: No distress  Neuro:without focal findings,  speech normal,  HEENT: PERRLA, EOM intact.   Pulmonary: normal breath sounds, No wheezing.  CardiovascularNormal S1,S2.  No m/r/g.   Abdomen: Benign, Soft, non-tender. Renal:  No costovertebral tenderness  GU:  No performed at this time. Endoc: No evident thyromegaly, no signs of acromegaly. Skin:   warm, no rashes, no ecchymosis  Extremities: normal, no cyanosis, clubbing.  Other findings:    LABORATORY PANEL:   CBC  Recent Labs Lab 12/13/14 0452  WBC 6.2  HGB 8.4*  HCT 25.5*  PLT 182   ------------------------------------------------------------------------------------------------------------------  Chemistries   Recent Labs Lab 12/15/14 0455  NA 136  K 5.3*  CL 107  CO2 23  GLUCOSE 249*  BUN 76*  CREATININE 3.01*  CALCIUM 8.4*   ------------------------------------------------------------------------------------------------------------------  Cardiac Enzymes No results for input(s): TROPONINI in the last 168 hours. ------------------------------------------------------------  RADIOLOGY:  Dg Chest Port 1 View  12/15/2014  CLINICAL DATA:  Dyspnea EXAM: PORTABLE CHEST 1 VIEW COMPARISON:  12/10/2014 FINDINGS: Cardiomegaly. There is small right pleural effusion with right lower lobe atelectasis or infiltrate. Mild left basilar  atelectasis. No pulmonary edema. IMPRESSION: Small right pleural effusion with right lower lobe atelectasis or infiltrate. Mild left basilar atelectasis. No pulmonary edema. Electronically Signed   By: Lahoma Crocker M.D.   On: 12/15/2014 08:56       Thank  you for the consultation and for allowing [facility] to assist in the care of your patient. Our recommendations are noted above.  Please contact us if we can be of further service.   [name], MD.  Board Certified in Internal Medicine, Pulmonary Medicine, [place], and Sleep Medicine.  [facility] Office Number: [phone]  Patricia Pesa, M.D.  Vilinda Boehringer, M.D.  Merton Border, M.D

## 2014-12-15 NOTE — Care Management (Signed)
Barrier to discharge is decline in renal status due to diuretics need to treat heart failure.  Currently receiving IVF at 50 cc/hr.  Unless there has been a decline in her functional status, continue to anticipate return to an assisted living level of care-family care home environment with home health.

## 2014-12-15 NOTE — Consult Note (Signed)
Received referral for engagement of [facility] services from [facility]  Paige James is currently inpatient at [facility] guardianship is with the county of [place], contact representative at [place] is  Paige James [phone], I have spoken with her to offer [facility] services as patient discharge plans are to be admitted to [facility]. Paige James states that "she will be willing to give it a shot, not sure how it will work as [name] has dementia", she states that we need to fax the consent to her at [phone] and she will forward it to Director/Deputy DSS for signature . Paige James states that Axel Filler, will be the  contact person [phone] at [facility].  I discussed with Paige James that I will contact her again after I have spoken with [facility] office regarding proceeding with patient follow up at Assisted Living.   Of note, [facility] Care Management services does not replace or interfere with any services that are arranged by inpatient case management or social work. For additional questions or referrals please contact:   Joylene Draft, RN, [place] [facility]/Hospital Liaison [phone]- Mobile [phone]- [place]

## 2014-12-15 NOTE — Progress Notes (Signed)
Physical Therapy Treatment Patient Details Name: Paige James MRN: [id number] DOB: 12/28/1920 Today's Date: 12/15/2014    History of Present Illness Pt is a 81 y.o. female presenting to hospital with AMS, hypoxia, and new onset a-fib.  PMH includes dementia, metastatic breast CA s/p R mastectomy with chemotherapy and radiation, s/p B TKR.    PT Comments    Pt agreeable to PT and up in the chair. Pt demonstrates need for safety cues for transfers and cues for posture/quality of ambulation for safe ambulation. All mobility takes increased time. Pt received up in chair comfortably. Continue PT for progression of strength throughout, safety and ambulation quality for optimal return home to prior level of function.   Follow Up Recommendations  Home health PT;Supervision for mobility/OOB     Equipment Recommendations  Rolling walker with 5" wheels    Recommendations for Other Services       Precautions / Restrictions Restrictions Weight Bearing Restrictions: No    Mobility  Bed Mobility Overal bed mobility: Modified Independent Bed Mobility: Supine to Sit     Supine to sit: Modified independent (Device/Increase time) (use of rails; increased time/effort)        Transfers Overall transfer level: Needs assistance Equipment used: Rolling walker (2 wheeled) Transfers: Sit to/from Stand Sit to Stand: Min assist         General transfer comment: Requires repeated cues both verbal and tactile for correct hand placement for stand  Ambulation/Gait Ambulation/Gait assistance: Min guard Ambulation Distance (Feet): 40 Feet Assistive device: Rolling walker (2 wheeled) Gait Pattern/deviations: Step-through pattern;Decreased dorsiflexion - right;Decreased dorsiflexion - left;Trunk flexed (decreased foot clearance; tends to slide feet) Gait velocity: slow Gait velocity interpretation: <1.8 ft/sec, indicative of risk for recurrent falls General Gait Details: heavy lean on  rw   Stairs            Wheelchair Mobility    Modified Rankin (Stroke Patients Only)       Balance Overall balance assessment: Needs assistance Sitting-balance support: Feet supported;Bilateral upper extremity supported Sitting balance-Leahy Scale: Fair Sitting balance - Comments: extremely forward flexed cannont rise and maintain upright back posture   Standing balance support: Bilateral upper extremity supported Standing balance-Leahy Scale: Fair                      Cognition Arousal/Alertness: Awake/alert Behavior During Therapy: WFL for tasks assessed/performed Overall Cognitive Status: Within Functional Limits for tasks assessed                      Exercises General Exercises - Lower Extremity Ankle Circles/Pumps: AROM;Both;Supine;20 reps Long Arc Quad: AROM;Both;20 reps;Seated Heel Slides: AAROM;Both;20 reps;Supine Hip ABduction/ADduction: AAROM;Left;20 reps;Supine Hip Flexion/Marching: AROM;Both;20 reps;Seated Toe Raises: AROM;Both;20 reps;Seated Heel Raises: AROM;Both;20 reps;Seated    General Comments        Pertinent Vitals/Pain Pain Assessment: No/denies pain    Home Living                      Prior Function            PT Goals (current goals can now be found in the care plan section) Progress towards PT goals: Progressing toward goals    Frequency  Min 2X/week    PT Plan Current plan remains appropriate    Co-evaluation             End of Session Equipment Utilized During Treatment: Gait belt;Oxygen Activity Tolerance: Patient limited by  fatigue;Patient tolerated treatment well Patient left: in chair;with call bell/phone within reach;with chair alarm set     Time: 3704-8889 PT Time Calculation (min) (ACUTE ONLY): 32 min  Charges:  $Gait Training: 8-22 mins $Therapeutic Exercise: 8-22 mins                    G Codes:      Charlaine Dalton 12/15/2014, 3:12 PM

## 2014-12-16 DIAGNOSIS — Z853 Personal history of malignant neoplasm of breast: Secondary | ICD-10-CM

## 2014-12-16 DIAGNOSIS — Z9011 Acquired absence of right breast and nipple: Secondary | ICD-10-CM

## 2014-12-16 DIAGNOSIS — Z923 Personal history of irradiation: Secondary | ICD-10-CM

## 2014-12-16 DIAGNOSIS — N179 Acute kidney failure, unspecified: Principal | ICD-10-CM

## 2014-12-16 DIAGNOSIS — Z992 Dependence on renal dialysis: Secondary | ICD-10-CM

## 2014-12-16 DIAGNOSIS — Z515 Encounter for palliative care: Secondary | ICD-10-CM

## 2014-12-16 DIAGNOSIS — R944 Abnormal results of kidney function studies: Secondary | ICD-10-CM

## 2014-12-16 DIAGNOSIS — Z9221 Personal history of antineoplastic chemotherapy: Secondary | ICD-10-CM

## 2014-12-16 LAB — BASIC METABOLIC PANEL
ANION GAP: 9 (ref 5–15)
BUN: 85 mg/dL — AB (ref 6–20)
CHLORIDE: 104 mmol/L (ref 101–111)
CO2: 22 mmol/L (ref 22–32)
CREATININE: 3.11 mg/dL — AB (ref 0.44–1.00)
Calcium: 8.5 mg/dL — ABNORMAL LOW (ref 8.9–10.3)
GFR calc Af Amer: 14 mL/min — ABNORMAL LOW (ref >60)
GFR, EST NON AFRICAN AMERICAN: 12 mL/min — AB (ref >60)
Glucose, Bld: 290 mg/dL — ABNORMAL HIGH (ref 65–99)
POTASSIUM: 5.1 mmol/L (ref 3.5–5.1)
SODIUM: 135 mmol/L (ref 135–145)

## 2014-12-16 LAB — HEMOGLOBIN: HEMOGLOBIN: 8.5 g/dL — AB (ref 12.0–16.0)

## 2014-12-16 LAB — GLUCOSE, CAPILLARY
Glucose-Capillary: 339 mg/dL — ABNORMAL HIGH (ref 65–99)
Glucose-Capillary: 254 mg/dL — ABNORMAL HIGH (ref 65–99)
Glucose-Capillary: 323 mg/dL — ABNORMAL HIGH (ref 65–99)

## 2014-12-16 MED ORDER — SODIUM CHLORIDE 0.9 % IV SOLN
INTRAVENOUS | Status: DC
  Administered 2014-12-16: 09:00:00 via INTRAVENOUS

## 2014-12-16 MED ORDER — GLIPIZIDE 10 MG PO TABS
5.0000 mg | ORAL_TABLET | Freq: Two times a day (BID) | ORAL | Status: DC
  Administered 2014-12-16 – 2014-12-18 (×5): 5 mg via ORAL
  Filled 2014-12-16 (×5): qty 1

## 2014-12-16 NOTE — Progress Notes (Signed)
Paige James at [place] NAME: Paige James    MR#:  [id number]  DATE OF BIRTH:  September 12, 1920  SUBJECTIVE:  CHIEF COMPLAINT:   Chief Complaint  Patient presents with  . Altered Mental Status   the patient is a 81 year old female with history of metastatic breast cancer status post right-sided mastectomy, diabetes, hypertension, hypothyroidism who presents to the hospital with 3 or 4 days of shortness of breath and weakness. She was found to be in A. Fib. Echo revealed ejection fraction of 45% to 50%. Her chest x-ray revealed bilateral pleural effusions, more on the right, diuresis was tried, however, patient's kidney function worsened. Diuretics were placed on hold and patient received IV fluids with improvement of her kidney function. Now her IV fluids have been stopped and her kidney function has worsened a little since yesterday.  Respiratory status remained stable despite IV fluid administration. Patient's wheezing has subsided with steroids and antibiotics. Remains on 2 L of oxygen through nasal cannula  Patient's kidney function again this is slightly worse than it was yesterday after discontinuation of fluids so she is back on fluids at low rate. Discussed his nephrologist and palliative care was recommended because of new kidney baseline and concerns of deterioration of kidney function was diuresis REVIEW OF SYSTEMS:    Review of Systems  Constitutional: Negative for fever and chills.  HENT: Negative for congestion and tinnitus.   Eyes: Negative for blurred vision and double vision.  Respiratory: Positive for wheezing. Negative for cough and shortness of breath.   Cardiovascular: Negative for chest pain, orthopnea and PND.  Gastrointestinal: Negative for nausea, vomiting, abdominal pain and diarrhea.  Genitourinary: Negative for dysuria and hematuria.  Neurological: Positive for weakness (generalized). Negative for dizziness, sensory change  and focal weakness.  All other systems reviewed and are negative.   Nutrition: Renal heart healthy Tolerating Diet: Yes Tolerating PT: Yes   DRUG ALLERGIES:  No Known Allergies  VITALS:  Blood pressure 135/68, pulse 60, temperature 97.4 F (36.3 C), temperature source Oral, resp. rate 15, height '5\' 5"'$  (1.651 m), weight 92.806 kg (204 lb 9.6 oz), SpO2 96 %.  PHYSICAL EXAMINATION:   Physical Exam  GENERAL:  81 y.o.-year-old patient lying in the bed in no acute distress. Comfortable,  No wheezing. No significant shortness of breath at rest EYES: Pupils equal, round, reactive to light and accommodation. No scleral icterus. Extraocular muscles intact.  HEENT: Head atraumatic, normocephalic. Oropharynx and nasopharynx clear.  NECK:  Supple, no jugular venous distention. No thyroid enlargement, no tenderness.  LUNGS: No evidence of accessory muscles were. Diminished breath sounds bilaterally at bases, however, no wheezing. Rales, rhonchi or crepitations. Breathing is calm CARDIOVASCULAR: S1, S2 , regular. No murmurs, rubs, or gallops.  ABDOMEN: Soft, nontender, nondistended. Bowel sounds present. No organomegaly or mass.  EXTREMITIES: No cyanosis, clubbing or edema b/l.    NEUROLOGIC: Cranial nerves II through XII are intact. No focal Motor or sensory deficits b/l.   PSYCHIATRIC: The patient is alert and oriented x 1.  SKIN: No obvious rash, lesion, or ulcer.    LABORATORY PANEL:   CBC  Recent Labs Lab 12/13/14 0452 12/16/14 0509  WBC 6.2  --   HGB 8.4* 8.5*  HCT 25.5*  --   PLT 182  --    ------------------------------------------------------------------------------------------------------------------  Chemistries   Recent Labs Lab 12/16/14 0509  NA 135  K 5.1  CL 104  CO2 22  GLUCOSE 290*  BUN  85*  CREATININE 3.11*  CALCIUM 8.5*   ------------------------------------------------------------------------------------------------------------------  Cardiac  Enzymes No results for input(s): TROPONINI in the last 168 hours. ------------------------------------------------------------------------------------------------------------------  RADIOLOGY:  Dg Chest Port 1 View  12/15/2014  CLINICAL DATA:  Dyspnea EXAM: PORTABLE CHEST 1 VIEW COMPARISON:  12/10/2014 FINDINGS: Cardiomegaly. There is small right pleural effusion with right lower lobe atelectasis or infiltrate. Mild left basilar atelectasis. No pulmonary edema. IMPRESSION: Small right pleural effusion with right lower lobe atelectasis or infiltrate. Mild left basilar atelectasis. No pulmonary edema. Electronically Signed   By: Lahoma Crocker M.D.   On: 12/15/2014 08:56     ASSESSMENT AND PLAN:   81 year old female with past medical history of chronic kidney disease stage III, hypertension, type 2 diabetes without, condition, dementia, osteoarthritis, hypothyroidism who presented to the hospital due to shortness of breath and also mental status.  #1 altered mental status-this is likely multifactorial and related to underlying dementia with sundowning and also complicated with underlying renal failure, metabolic encephalopathy due to Ativan. -Mental status has improved . No agitation . Patient received Haldol , but did not require any medications recently. Avoiding benzodiazepines.-Continue to follow mental status, overall stable  #2 acute respiratory failure with hypoxia- secondary to CHF, also underlying acute bronchitis/COPD exacerbation. -CT chest showing a moderate right-sided right pleural effusion and a small left pleural effusion and a small pulmonary nodule. Appreciate pulmonary consult , patient would benefit from diuresis. However, patient has poor and worsening kidney function,  diuretics may decline in kidney function even further.    -Holding  Lasix given renal failure. Tapering steroids, finished Z-Pak. Clinically stable, weaning off oxygen. Unable to reinitiate diuretics  As kidney  function is worsening. We will be getting palliative care involved for further recommendations and discussions  with family . Patient kidney function has new baseline and I am afraid  kidney function will deteriorate with diuretic therapy  #3 acute bronchitis with COPD exacerbation-continue Xopenex nebs, Zithromax. Tapering steroids. Patient is clinically improving.  CT chest yesterday showing no airway disease but evidence of bilateral pleural effusions more on the right than the left. Pulmonary consult is appreciated, family cannot make decisions about thoracentesis, as patient belongs to DSS.  CHF however is likely reason for pleural effusions.   #4 CHF-acute on chronic combined systolic- diastolic dysfunction. -Patient's echocardiogram showed mild LV dysfunction with EF of 45% and significant pulmonary hypertension. -Appreciate cardiology input. Cont. Coreg. Holding Lasix given poor renal function,.   #5 new-onset atrial fibrillation, now in sinus rhythm-rate controlled and continue Coreg, Cardizem. -Given her advanced age and high fall risk she is not a long-term anticoagulation candidate.  -Continue aspirin. Appreciate cardiology input  #6 . Acute on chronic renal failure with history of chronic kidney disease stage III-IV-patient likely has chronic kidney disease secondary to diabetes hypertension/age.  Kidney function is worse overall, but a likely new baseline as discussed above. Renal US showing no evidence of obstruction but evidence of medical renal disease. Reinitiate patient on low rate IV fluids discussed with nephrologist. Palliative care will be involved for discussions with family since kidney function will not improve unless patient is  given IV fluids which idetrimental for her given congestive heart failure  #7 depression-continue Prozac  #8 urinary incontinence-continue oxybutynin.  #9 GERD-continue Protonix.  #10 hypothyroidism-continue Synthroid. Patient's TSH is slightly  elevated. We'll increase her Synthroid dose slightly upon discharge  Await PT Eval.    All the records are reviewed and case discussed with Care Management/Social Workerr. Management  plans discussed with the patient, family and they are in agreement.  CODE STATUS: DO NOT RESUSCITATE  DVT Prophylaxis: Heparin subcutaneous  TOTAL TIME TAKING CARE OF THIS PATIENT: 40 minutes.  Discussed with care management, Dr. Candiss Norse   .  Theodoro Grist M.D on 12/16/2014 at 12:46 PM  Between 7am to 6pm - Pager - [phone]  After 6pm go to [email or web address] - password [email or web address]  [facility]  Office  [phone]  CC: Primary care physician; No primary care provider on file.

## 2014-12-16 NOTE — Progress Notes (Signed)
Patient up to chair,remains on 2 liters of oxygen,uncontrolled blood sugars,very weak and vital signs within normal limits.

## 2014-12-16 NOTE — Consult Note (Signed)
Palliative Medicine Inpatient Consult Note   Name: Paige James Date: 12/16/2014 MRN: [id number]  DOB: 1920-09-10  Referring Physician: Theodoro Grist, MD  Palliative Care consult requested for this 81 y.o. female for goals of medical therapy in patient with metastatic breast cancer and other problems as detailed below under 'Impression".  Palliative Care consult seems to have been triggered primarily by the fact that her renal function is worsening and diuresis has had to be held due to rising creatinine. She has an element of cardiorenal syndrome and that does not portend a good prognosis. This, combined with her age and other problems, gives her a limited life expectancy going forward.     TODAY'S DISCUSSIONS AND DECISIONS:  1.  Pt is DNR 2.  I completed a portable DNR form for the paper chart. 3.  Pt has guardian at [place]: [name] [phone]       Also listed as guardian is Charolotte Capuchin at [phone] 4.  I left a message at 4:35 pm for DSS to call me back tomorrow 5.  I also spoke with pt's daughter, [name], in person in the room today to get her thoughts.  She is not a Media planner (having lost that role when she herself was sick at the same time her mother was in the past).  However, she demonstrates ongoing care and love and should be involved in what is going on.  She says she visits the family care home twice a week and it is not far from her home. She would want Hospice there in the [facility] "if they can keep her there now that she is weaker". 6.  My recommendation would be to have pt receive Hospice Services in the [facility] where she has been living.  However, we have to check first with the [facility] to be sure they can handle pt coming back a little weaker, a little more confused, and needing Hospice Services.  Pt recommended Home Health PT.  The other option would be to get [facility] involved and then transition pt to Hospice.  However, Hospice at the start would be what I favor over that option.  7.  If pt goes under care of Hospice at the [facility] --I would  recommend a de-escalation regimen for her meds.  When life expectancy is limited, meds should be reduced to those providing true benefit and her med list could then be trimmed a bit prior to discharge. This might enable her to take in a few more bites of food.      IMPRESSION: 1.  Breast Cancer, metastatic ---h/o right mastectomy, chemo, and apparently also radiation ---currently negative for active CA per report 2. DM2 (with Hgb A1c  6.0) 3. Essential HTN 4.  Hypothyroidism (with TSH 12.422) 5.  Afib  ---now is NSR with Coreg and Cardizem ---due to age and high fall risk she is not a long term anticoagulation candidate ---she will be on Aspirin ---Cardiology consulting 6.  Bilateral Pleural Effusions R >L 7.  Acute renal failure on chronic kidney disease ---new baseline is felt to be underway with Cr today 3.11 with EGFR 12 -14 ---new baseline kidney function =CKD stage 4 (was stage 3). 8.  Anemia with B12 level borderline low 9.  Proteinuria ---with HIgh Kappa and Lamda light chain values by Protein Electrophoresis 10. Acute Hypoxic Respiratory Failure due to effusions, atelectasis, and acute bronchitis with COPD exacerbation 11. COPD exacerbation 11.  Acute on Chronic Systolic and Diastolic Congestive Heart Failure ---EF now 45-50%     ----with mild to mod MR and TR seen also on echo 12. Pulmonary HTN (moderate) 13.  Depression 14.  GERD 15.  Ward of state (DSS) 16.  Metabolic Encephalopathy ---worsened by Ativan 17.  Dementia  ---with sundowning 18.  H/O Bilateral TKR ---she resides at [facility]  19.  DJD 20.  Atopic Dermatitis 21.  Moderate Malnutrition   REVIEW OF SYSTEMS:  Nursing report states pt has stayed in NSR and denies pain and is hemodynamically stable.  Pt rested for most of the night.     SOCIAL HISTORY:  reports that she has never smoked. She does not have any smokeless tobacco history on file.  LEGAL DOCUMENTS:  I completed the portable DNR form for the paper record.  CODE STATUS: DNR  PAST MEDICAL HISTORY: Past Medical History  Diagnosis Date  . Renal disorder   . Diabetes mellitus without complication ([place])   . Thyroid disease   . Senile dementia   . Hypertension   . Atopic dermatitis   . Osteoarthritis   . Depressive disorder     PAST SURGICAL HISTORY: History reviewed. No pertinent past surgical history.  ALLERGIES:  has No Known Allergies.  MEDICATIONS:  Current Facility-Administered Medications  Medication Dose Route Frequency Provider Last Rate Last Dose  . 0.9 %  sodium chloride infusion  250 mL Intravenous PRN Vipul Shah, MD      . 0.9 %  sodium chloride infusion   Intravenous Continuous Theodoro Grist, MD 25 mL/hr at 12/16/14 1145    . acetaminophen (TYLENOL) tablet 650 mg  650 mg Oral Q4H PRN Max Sane, MD      . aspirin EC tablet 81 mg  81 mg Oral Daily Max Sane, MD   81 mg at 12/16/14 3875  . carvedilol (COREG) tablet 6.25 mg  6.25 mg Oral BID Minna Merritts, MD   6.25 mg at 12/16/14 0839  . cetirizine (ZYRTEC) tablet 10 mg  10 mg Oral Daily Max Sane, MD   10 mg at 12/16/14 6433  . diltiazem (CARDIZEM CD) 24 hr capsule 120 mg  120 mg Oral Daily Burnell Blanks, MD   120 mg at 12/16/14 0837  . FLUoxetine (PROZAC) capsule 10 mg  10 mg Oral Daily Max Sane, MD   10 mg at 12/16/14 0837  . haloperidol lactate (HALDOL) injection 0.5 mg  0.5 mg Intravenous Q6H PRN Henreitta Leber, MD      . heparin injection 5,000 Units  5,000 Units Subcutaneous 3 times per day Max Sane, MD   5,000 Units at 12/16/14 0542  . hydrALAZINE (APRESOLINE) tablet 25 mg  25 mg Oral 4 times per day Theodoro Grist, MD   25 mg at 12/16/14 1231  . insulin aspart (novoLOG) injection 0-5 Units  0-5 Units Subcutaneous QHS Max Sane, MD   5 Units at 12/15/14 2302  .  insulin aspart (novoLOG) injection 0-9 Units  0-9 Units Subcutaneous TID WC Max Sane, MD   5 Units at 12/16/14 1232  . isosorbide mononitrate (IMDUR) 24 hr tablet 30 mg  30 mg Oral Daily Theodoro Grist, MD   30 mg at 12/16/14 0840  . levalbuterol (XOPENEX) nebulizer solution 1.25 mg  1.25 mg Nebulization Q6H PRN Henreitta Leber, MD   1.25 mg at 12/11/14 1447  . levothyroxine (SYNTHROID, LEVOTHROID) tablet 125 mcg  125 mcg Oral QAC breakfast Henreitta Leber,  MD   125 mcg at 12/16/14 0838  . methylPREDNISolone (MEDROL DOSEPAK) tablet 4 mg  4 mg Oral 3 x daily with food Theodoro Grist, MD   4 mg at 12/16/14 1247  . [START ON 12/17/2014] methylPREDNISolone (MEDROL DOSEPAK) tablet 4 mg  4 mg Oral 4X daily taper Theodoro Grist, MD      . methylPREDNISolone (MEDROL DOSEPAK) tablet 8 mg  8 mg Oral Nightly Theodoro Grist, MD      . multivitamin with minerals tablet 1 tablet  1 tablet Oral Daily Max Sane, MD   1 tablet at 12/16/14 0840  . olopatadine (PATANOL) 0.1 % ophthalmic solution 1 drop  1 drop Both Eyes Daily Max Sane, MD   1 drop at 12/16/14 0842  . ondansetron (ZOFRAN) injection 4 mg  4 mg Intravenous Q6H PRN Max Sane, MD      . oxybutynin (DITROPAN-XL) 24 hr tablet 10 mg  10 mg Oral Daily Max Sane, MD   10 mg at 12/16/14 0839  . pantoprazole (PROTONIX) EC tablet 40 mg  40 mg Oral Daily Max Sane, MD   40 mg at 12/16/14 0839  . sodium bicarbonate tablet 650 mg  650 mg Oral BID Max Sane, MD   650 mg at 12/16/14 6468  . sodium chloride 0.9 % injection 3 mL  3 mL Intravenous Q12H Max Sane, MD   3 mL at 12/16/14 0844  . sodium chloride 0.9 % injection 3 mL  3 mL Intravenous PRN Max Sane, MD   3 mL at 12/15/14 2254  . vitamin A & D ointment 1 application  1 application Topical Q M,W,F Max Sane, MD   1 application at 05/17/20 1146  . Vitamin D (Ergocalciferol) (DRISDOL) capsule 50,000 Units  50,000 Units Oral Q7 days Max Sane, MD   50,000 Units at 12/15/14 2252    Vital Signs: BP 135/68  mmHg  Pulse 60  Temp(Src) 97.4 F (36.3 C) (Oral)  Resp 15  Ht 5' 5"  (1.651 m)  Wt 92.806 kg (204 lb 9.6 oz)  BMI 34.05 kg/m2  SpO2 96% Filed Weights   12/14/14 0433 12/15/14 0535 12/16/14 0522  Weight: 90.311 kg (199 lb 1.6 oz) 88.406 kg (194 lb 14.4 oz) 92.806 kg (204 lb 9.6 oz)    Estimated body mass index is 34.05 kg/(m^2) as calculated from the following:   Height as of this encounter: 5' 5"  (1.651 m).   Weight as of this encounter: 92.806 kg (204 lb 9.6 oz).  PERFORMANCE STATUS (ECOG) : 4 - Bedbound  PHYSICAL EXAM: Resting --but turing a bit in bed at this moment EOMI OP clear Hrt rrr no mgr heard Lungs with decrease BS basees Abd soft nt Ext no cyanosis or mottling  Skin is warm and dry  LABS: CBC:    Component Value Date/Time   WBC 6.2 12/13/2014 0452   WBC 4.2 05/21/2013 1001   HGB 8.5* 12/16/2014 0509   HGB 10.6* 05/21/2013 1001   HCT 25.5* 12/13/2014 0452   HCT 33.1* 05/21/2013 1001   PLT 182 12/13/2014 0452   PLT 208 05/21/2013 1001   MCV 99.9 12/13/2014 0452   MCV 98 05/21/2013 1001   NEUTROABS 2.8 12/09/2014 0447   NEUTROABS 2.2 05/21/2013 1001   LYMPHSABS 1.3 12/09/2014 0447   LYMPHSABS 1.5 05/21/2013 1001   MONOABS 0.5 12/09/2014 0447   MONOABS 0.3 05/21/2013 1001   EOSABS 0.1 12/09/2014 0447   EOSABS 0.1 05/21/2013 1001   BASOSABS 0.0 12/09/2014 0447  BASOSABS 0.0 05/21/2013 1001   BASOSABS 1 04/06/2013 1533   Comprehensive Metabolic Panel:    Component Value Date/Time   NA 135 12/16/2014 0509   NA 140 05/21/2013 1001   K 5.1 12/16/2014 0509   K 4.4 05/21/2013 1001   CL 104 12/16/2014 0509   CL 104 05/21/2013 1001   CO2 22 12/16/2014 0509   CO2 27 05/21/2013 1001   BUN 85* 12/16/2014 0509   BUN 45* 05/21/2013 1001   CREATININE 3.11* 12/16/2014 0509   CREATININE 1.76* 05/21/2013 1001   GLUCOSE 290* 12/16/2014 0509   GLUCOSE 132* 05/21/2013 1001   CALCIUM 8.5* 12/16/2014 0509   CALCIUM 9.0 05/21/2013 1001   AST 26 05/21/2013  1001   ALT 32 05/21/2013 1001   ALKPHOS 72 05/21/2013 1001   BILITOT 0.2 05/21/2013 1001   PROT 7.4 05/21/2013 1001   ALBUMIN 3.2* 05/21/2013 1001   TESTS:  CXR 10/18: Small right pleural effusion with right lower lobe atelectasis or infiltrate. Mild left basilar atelectasis. No pulmonary edema.   CT chest 10/13: 1. Bilateral pleural effusions, moderate on the RIGHT and small on the LEFT with associated bibasilar atelectasis. 2. Stable nodule corporal parenchymal thickening at the RIGHT lung apex. 3. No airspace disease evident.  US Kidneys 10/12: Bilateral echogenic kidneys consistent with renal medical disease.  No obstruction.   CXR 10/11: Mild to moderate acute CHF, with stable mild cardiomegaly and mild to moderate diffuse interstitial pulmonary edema. Bilateral pleural effusions and associated passive atelectasis in the lower lobes.  ECHO 10/11: - Left ventricle: The cavity size was normal. Systolic function was mildly reduced. The estimated ejection fraction was in the range of 45% to 50%. Wall motion was normal; there were no regional wall motion abnormalities. - Mitral valve: There was mild to moderate regurgitation. - Right ventricle: Systolic function was normal. - Tricuspid valve: There was mild-moderate regurgitation. - Pulmonary arteries: Systolic pressure was moderately elevated. PA peak pressure: 53 to 58 mm Hg (S).  Impressions:  - Rhythm is atrial fibrillation  More than 50% of the visit was spent in counseling/coordination of care: Yes  Time Spent: 80 minutes

## 2014-12-16 NOTE — Progress Notes (Signed)
Physical Therapy Treatment Patient Details Name: Paige James MRN: [id number] DOB: February 05, 1921 Today's Date: 12/16/2014    History of Present Illness Pt is a 81 y.o. female presenting to hospital with AMS, hypoxia, and new onset a-fib.  PMH includes dementia, metastatic breast CA s/p R mastectomy with chemotherapy and radiation, s/p B TKR.    PT Comments    Pt demonstrates improved speed with bed mobility, improved STS transfers with proper hand placement and ability to rise with initial attempt. Pt ambulates in room today; however, with improved speed, but poor safety with rolling walker too far out in front with uncontrolled manner despite cueing. Pt participated well with exercises and received up in chair comfortably. Pt's O2 saturation remains constant 96-97% throughout treatment; pt quite fatigued post ambulation. Continue PT to improve safe ambulation for improved functional mobility to allow safe return home.   Follow Up Recommendations  Home health PT;Supervision for mobility/OOB     Equipment Recommendations  Rolling walker with 5" wheels    Recommendations for Other Services       Precautions / Restrictions Restrictions Weight Bearing Restrictions: No    Mobility  Bed Mobility Overal bed mobility: Modified Independent Bed Mobility: Supine to Sit     Supine to sit: Modified independent (Device/Increase time)     General bed mobility comments:  (Sits with use of rails only)  Transfers Overall transfer level: Modified independent Equipment used: Rolling walker (2 wheeled) Transfers: Sit to/from Stand Sit to Stand: Min guard         General transfer comment: Stands on cue with good hand placement and Min guard for safety  Ambulation/Gait Ambulation/Gait assistance: Min guard Ambulation Distance (Feet): 50 Feet Assistive device: Rolling walker (2 wheeled) Gait Pattern/deviations: Step-through pattern;Trunk flexed   Gait velocity interpretation: at or  above normal speed for age/gender General Gait Details: Quicker pace today with rw too far out in front causing safety concern; uncorrected with cues; fatigued post session    Stairs            Wheelchair Mobility    Modified Rankin (Stroke Patients Only)       Balance                                    Cognition Arousal/Alertness: Awake/alert Behavior During Therapy: WFL for tasks assessed/performed Overall Cognitive Status: Within Functional Limits for tasks assessed                      Exercises General Exercises - Lower Extremity Ankle Circles/Pumps: AROM;Both;Supine;20 reps Quad Sets: Strengthening;Both;20 reps;Supine Short Arc Quad: AROM;Both;20 reps;Supine Long Arc Quad: AROM;Both;20 reps;Seated Hip Flexion/Marching: AROM;Both;20 reps;Seated    General Comments        Pertinent Vitals/Pain Pain Assessment: No/denies pain    Home Living                      Prior Function            PT Goals (current goals can now be found in the care plan section) Progress towards PT goals: Progressing toward goals    Frequency  Min 2X/week    PT Plan Current plan remains appropriate    Co-evaluation             End of Session Equipment Utilized During Treatment: Gait belt;Oxygen Activity Tolerance: Patient tolerated treatment well;Patient limited by fatigue  Patient left: in chair;with call bell/phone within reach;with chair alarm set;with family/visitor present     Time: 1130-1153 PT Time Calculation (min) (ACUTE ONLY): 23 min  Charges:  $Gait Training: 8-22 mins $Therapeutic Exercise: 8-22 mins                    G Codes:      Paige James 12/16/2014, 12:07 PM

## 2014-12-16 NOTE — Care Management (Signed)
Palliative has assessed patient and spoke with patient's daughter.  Aware that daaghter dies not have rights to make medical decisions regarding patient care/plan.  Dr Megan Salon left a message for Paige James at DSS to discuss plan of care.  It is recommended that patient would meet criteria for home hospice at the family care home.  It is not known if patient's daughter is aware that patient is being moved to a new family care home.  A message has been left with [name] to discuss care needs and level of care.

## 2014-12-16 NOTE — Progress Notes (Signed)
Subjective:  Serum creatinine still high at 3.11 No acute SOB Patient was placed back on iv fluids No N/V reported Ate breakfast. Tolerated well/  Objective:  Vital signs in last 24 hours:  Temp:  [97.4 F (36.3 C)-98.2 F (36.8 C)] 98.2 F (36.8 C) (10/19 0522) Pulse Rate:  [57-66] 65 (10/19 0535) Resp:  [15-20] 18 (10/19 0522) BP: (125-164)/(66-87) 164/87 mmHg (10/19 0535) SpO2:  [96 %-100 %] 97 % (10/19 0522) Weight:  [92.806 kg (204 lb 9.6 oz)] 92.806 kg (204 lb 9.6 oz) (10/19 0522)  Weight change: 4.4 kg (9 lb 11.2 oz) Filed Weights   12/14/14 0433 12/15/14 0535 12/16/14 0522  Weight: 90.311 kg (199 lb 1.6 oz) 88.406 kg (194 lb 14.4 oz) 92.806 kg (204 lb 9.6 oz)    Intake/Output: I/O last 3 completed shifts: In: 480 [P.O.:480] Out: 800 [Urine:800]   Intake/Output this shift:  Total I/O In: 120 [P.O.:120] Out: -   Physical Exam: General: NAD  Head: Normocephalic, atraumatic. Moist oral mucosal membranes  Eyes: Anicteric  Neck: Supple, trachea midline  Lungs:  Mild crackles, normal effort  Heart: No rub  Abdomen:  Soft, nontender, BS present  Extremities:  trace peripheral edema.  Neurologic: Nonfocal, moving all four extremities  Skin: No lesions       Basic Metabolic Panel:  Recent Labs Lab 12/11/14 0626 12/12/14 0453 12/13/14 0452 12/14/14 0626 12/15/14 0455 12/16/14 0509  NA 141 136 138  --  136 135  K 4.0 4.6 4.4  --  5.3* 5.1  CL 109 106 107  --  107 104  CO2 24 23 24   --  23 22  GLUCOSE 95 263* 154*  --  249* 290*  BUN 45* 55* 59*  --  76* 85*  CREATININE 2.74* 3.11* 2.88* 2.77* 3.01* 3.11*  CALCIUM 8.2* 8.2* 8.2*  --  8.4* 8.5*    Liver Function Tests: No results for input(s): AST, ALT, ALKPHOS, BILITOT, PROT, ALBUMIN in the last 168 hours. No results for input(s): LIPASE, AMYLASE in the last 168 hours. No results for input(s): AMMONIA in the last 168 hours.  CBC:  Recent Labs Lab 12/10/14 0402 12/13/14 0452 12/16/14 0509   WBC 4.6 6.2  --   HGB 9.4* 8.4* 8.5*  HCT 29.1* 25.5*  --   MCV 100.4* 99.9  --   PLT 218 182  --     Cardiac Enzymes: No results for input(s): CKTOTAL, CKMB, CKMBINDEX, TROPONINI in the last 168 hours.  BNP: Invalid input(s): POCBNP  CBG:  Recent Labs Lab 12/15/14 0803 12/15/14 1243 12/15/14 2123 12/15/14 2302 12/16/14 0816  GLUCAP 213* 277* 315* 340* 339*    Microbiology: No results found for this or any previous visit.  Coagulation Studies: No results for input(s): LABPROT, INR in the last 72 hours.  Urinalysis: No results for input(s): COLORURINE, LABSPEC, PHURINE, GLUCOSEU, HGBUR, BILIRUBINUR, KETONESUR, PROTEINUR, UROBILINOGEN, NITRITE, LEUKOCYTESUR in the last 72 hours.  Invalid input(s): APPERANCEUR    Imaging: Dg Chest Port 1 View  12/15/2014  CLINICAL DATA:  Dyspnea EXAM: PORTABLE CHEST 1 VIEW COMPARISON:  12/10/2014 FINDINGS: Cardiomegaly. There is small right pleural effusion with right lower lobe atelectasis or infiltrate. Mild left basilar atelectasis. No pulmonary edema. IMPRESSION: Small right pleural effusion with right lower lobe atelectasis or infiltrate. Mild left basilar atelectasis. No pulmonary edema. Electronically Signed   By: Lahoma Crocker M.D.   On: 12/15/2014 08:56     Medications:   . sodium chloride 50 mL/hr at  12/16/14 0845   . aspirin EC  81 mg Oral Daily  . carvedilol  6.25 mg Oral BID  . cetirizine  10 mg Oral Daily  . diltiazem  120 mg Oral Daily  . FLUoxetine  10 mg Oral Daily  . heparin  5,000 Units Subcutaneous 3 times per day  . hydrALAZINE  25 mg Oral 4 times per day  . insulin aspart  0-5 Units Subcutaneous QHS  . insulin aspart  0-9 Units Subcutaneous TID WC  . isosorbide mononitrate  30 mg Oral Daily  . levothyroxine  125 mcg Oral QAC breakfast  . methylPREDNISolone  4 mg Oral 3 x daily with food  . [START ON 12/17/2014] methylPREDNISolone  4 mg Oral 4X daily taper  . methylPREDNISolone  8 mg Oral Nightly  .  multivitamin with minerals  1 tablet Oral Daily  . olopatadine  1 drop Both Eyes Daily  . oxybutynin  10 mg Oral Daily  . pantoprazole  40 mg Oral Daily  . sodium bicarbonate  650 mg Oral BID  . sodium chloride  3 mL Intravenous Q12H  . vitamin A & D  1 application Topical Q M,W,F  . Vitamin D (Ergocalciferol)  50,000 Units Oral Q7 days   sodium chloride, acetaminophen, haloperidol lactate, levalbuterol, ondansetron (ZOFRAN) IV, sodium chloride  Assessment/ Plan:  81 y.o. female  with a PMHX of chronic kidney disease stage IV baseline EGFR 29 and creatinine of 1.7, diabetes mellitus type 2, dementia, hypertension, osteophytes, depression, who was admitted to [facility] on 12/08/2014 for evaluation of altered mental status. [facility] nephrology as outpt.  1. Acute renal failure/CKD stage IV baseline Cr 1.7 egfr 29. Acute renal failure likely secondary to altered cardiorenal hemodynamics. Chronic kidney disease is likely multifactorial with contributions from diabetes mellitus, hypertension, and age. Renal US shows small kidneys. SPEP negative.  [facility] nephrology as outpt. - Cr remains above baseline though is trending down slowly.   - monitor  2. Acute diastolic heart failure. Ejection fraction was found to be 45-50% however right-sided pressures were elevated.  -  Would continue to monitor for signs of respiratory distress.     3. Anemia of CKD: Hgb 8.5, given epogen [place] this admission   LOS: 8 Aydyn Testerman 10/19/201610:37 AM

## 2014-12-17 ENCOUNTER — Inpatient Hospital Stay: Payer: Medicare Other

## 2014-12-17 DIAGNOSIS — J209 Acute bronchitis, unspecified: Secondary | ICD-10-CM

## 2014-12-17 DIAGNOSIS — J9601 Acute respiratory failure with hypoxia: Secondary | ICD-10-CM

## 2014-12-17 DIAGNOSIS — R410 Disorientation, unspecified: Secondary | ICD-10-CM

## 2014-12-17 DIAGNOSIS — J90 Pleural effusion, not elsewhere classified: Secondary | ICD-10-CM

## 2014-12-17 DIAGNOSIS — J441 Chronic obstructive pulmonary disease with (acute) exacerbation: Secondary | ICD-10-CM

## 2014-12-17 DIAGNOSIS — G9341 Metabolic encephalopathy: Secondary | ICD-10-CM

## 2014-12-17 LAB — GLUCOSE, SEROUS FLUID: GLUCOSE FL: 274 mg/dL

## 2014-12-17 LAB — LACTATE DEHYDROGENASE, PLEURAL OR PERITONEAL FLUID: LD, Fluid: 54 U/L — ABNORMAL HIGH (ref 3–23)

## 2014-12-17 LAB — PROTIME-INR
INR: 1.11
Prothrombin Time: 14.5 seconds (ref 11.4–15.0)

## 2014-12-17 LAB — BODY FLUID CELL COUNT WITH DIFFERENTIAL
Eos, Fluid: 0 %
Lymphs, Fluid: 69 %
Monocyte-Macrophage-Serous Fluid: 24 %
Neutrophil Count, Fluid: 7 %
Other Cells, Fluid: 0 %
WBC FLUID: 181 uL

## 2014-12-17 LAB — GLUCOSE, CAPILLARY
GLUCOSE-CAPILLARY: 291 mg/dL — AB (ref 65–99)
Glucose-Capillary: 227 mg/dL — ABNORMAL HIGH (ref 65–99)

## 2014-12-17 LAB — PROTEIN, BODY FLUID

## 2014-12-17 MED ORDER — FENTANYL CITRATE (PF) 100 MCG/2ML IJ SOLN
INTRAMUSCULAR | Status: AC
  Filled 2014-12-17: qty 2

## 2014-12-17 MED ORDER — MIDAZOLAM HCL 5 MG/5ML IJ SOLN
INTRAMUSCULAR | Status: AC
  Filled 2014-12-17: qty 5

## 2014-12-17 NOTE — Care Management Note (Signed)
Case Management Note  Patient Details  Name: MADDALYNN BARNARD MRN: [id number] Date of Birth: 1920/04/26  Subjective/Objective:          Spoke with [name] from [facility].  Informed that patient's daughter is very much aware that patient is being transferred to a different family care home.  this transfer was necessary due to financial issues- not the level of care.  [name] will contact Dr Megan Salon but she did give approval to pursue hospice services at [facility]  . [facility].  Referral made to nurse liaison.  Discussed that family care home staff will need to understand then plan of care and interventions to prevent rehospitalization for sx that can be handled by the hospice staff.      Reviewed the medicare IM notice and Rudean Curt understanding of the right to disagree with discharge and the right to appeal.  Faxed copy to  [phone] and received confirmation of the fax.  Placed the confirmation in the shadow chart   Action/Plan:   Expected Discharge Date:                  Expected Discharge Plan:     In-House Referral:     Discharge planning Services     Post Acute Care Choice:    Choice offered to:     DME Arranged:    DME Agency:     HH Arranged:    [place] Agency:     Status of Service:     Medicare Important Message Given:  Yes-fourth notification given Date Medicare IM Given:    Medicare IM give by:    Date Additional Medicare IM Given:    Additional Medicare Important Message give by:     If discussed at [place] of Stay Meetings, dates discussed:    Additional Comments:  Katrina Stack, RN 12/17/2014, 9:05 AM

## 2014-12-17 NOTE — Progress Notes (Signed)
To Ultrasound via bed 

## 2014-12-17 NOTE — Progress Notes (Signed)
PT Cancellation Note  Patient Details Name: Paige James MRN: [id number] DOB: 18-Jun-1920   Cancelled Treatment:    Reason Eval/Treat Not Completed: Patient at procedure or test/unavailable. Treatment attempted; patient unavailable. Attempt treatment tomorrow   Charlaine Dalton 12/17/2014, 2:59 PM

## 2014-12-17 NOTE — Progress Notes (Signed)
Back from ultrasound, bandaid dry and intact to rt mid back.  Pt denies need at this time.

## 2014-12-17 NOTE — Care Management Important Message (Signed)
Important Message  Patient Details  Name: BRANDYCE DIMARIO MRN: [id number] Date of Birth: 26-Nov-1920   Medicare Important Message Given:  Reviewed the medicare IM Notice with the guardian- [name] at [facility] and [name] faxed. She verbalized understanding of the notice and the right to appeal.  Received verbal confirmation that she received the document .  She will sign it and fax it back to this CM   Katrina Stack, RN 12/17/2014, 10:04 AM

## 2014-12-17 NOTE — Progress Notes (Addendum)
[facility] [place] NAME: Paige James    MR#:  [id number]  DATE OF BIRTH:  05-27-1920  SUBJECTIVE:  CHIEF COMPLAINT:   Chief Complaint  Patient presents with  . Altered Mental Status   the patient is a 81 year old female with history of metastatic breast cancer status post right-sided mastectomy, diabetes, hypertension, hypothyroidism who presents to the hospital with 3 or 4 days of shortness of breath and weakness. She was found to be in A. Fib. Echo revealed ejection fraction of 45% to 50%. Her chest x-ray revealed bilateral pleural effusions, more on the right, diuresis was tried, however, patient's kidney function worsened. Diuretics were placed on hold and patient received IV fluids with improvement of her kidney function. Now her IV fluids have been stopped and her kidney function has worsened a little since yesterday.  Respiratory status remained stable despite IV fluid administration. Patient's wheezing has subsided with steroids and antibiotics. Remains on 2 L of oxygen through nasal cannula  Patient's kidney function worsened again without IV fluids. Palliative  care discussed case with family and make decision about hospice follow-up at home. Patient will be transferred to different group home due to financial issues.  REVIEW OF SYSTEMS:    Review of Systems  Constitutional: Negative for fever, chills and weight loss.  HENT: Negative for congestion and tinnitus.   Eyes: Negative for blurred vision and double vision.  Respiratory: Negative for cough, sputum production, shortness of breath and wheezing.   Cardiovascular: Negative for chest pain, palpitations, orthopnea, leg swelling and PND.  Gastrointestinal: Negative for nausea, vomiting, abdominal pain, diarrhea, constipation and blood in stool.  Genitourinary: Negative for dysuria, urgency, frequency and hematuria.  Musculoskeletal: Negative for falls.  Neurological: Negative  for dizziness, tremors, sensory change, focal weakness and headaches. Weakness: generalized.  Endo/Heme/Allergies: Does not bruise/bleed easily.  Psychiatric/Behavioral: Negative for depression. The patient does not have insomnia.   All other systems reviewed and are negative.   Nutrition: Renal heart healthy Tolerating Diet: Yes Tolerating PT: Yes   DRUG ALLERGIES:  No Known Allergies  VITALS:  Blood pressure 148/81, pulse 55, temperature 98 F (36.7 C), temperature source Oral, resp. rate 17, height '5\' 5"'$  (1.651 m), weight 92.625 kg (204 lb 3.2 oz), SpO2 100 %.  PHYSICAL EXAMINATION:   Physical Exam  GENERAL:  81 y.o.-year-old patient lying in the bed in no acute distress. Comfortable,  No wheezing. No significant shortness of breath at rest EYES: Pupils equal, round, reactive to light and accommodation. No scleral icterus. Extraocular muscles intact.  HEENT: Head atraumatic, normocephalic. Oropharynx and nasopharynx clear.  NECK:  Supple, no jugular venous distention. No thyroid enlargement, no tenderness.  LUNGS: No evidence of accessory muscles were. Diminished breath sounds bilaterally at bases, however, no wheezing. Rales, rhonchi or crepitations. Breathing is calm CARDIOVASCULAR: S1, S2 , regular. No murmurs, rubs, or gallops.  ABDOMEN: Soft, nontender, nondistended. Bowel sounds present. No organomegaly or mass.  EXTREMITIES: No cyanosis, clubbing or edema b/l.    NEUROLOGIC: Cranial nerves II through XII are intact. No focal Motor or sensory deficits b/l.   PSYCHIATRIC: The patient is alert and oriented x 1.  SKIN: No obvious rash, lesion, or ulcer.    LABORATORY PANEL:   CBC  Recent Labs Lab 12/13/14 0452 12/16/14 0509  WBC 6.2  --   HGB 8.4* 8.5*  HCT 25.5*  --   PLT 182  --    ------------------------------------------------------------------------------------------------------------------  Chemistries  Recent Labs Lab 12/16/14 0509  NA 135  K 5.1   CL 104  CO2 22  GLUCOSE 290*  BUN 85*  CREATININE 3.11*  CALCIUM 8.5*   ------------------------------------------------------------------------------------------------------------------  Cardiac Enzymes No results for input(s): TROPONINI in the last 168 hours. ------------------------------------------------------------------------------------------------------------------  RADIOLOGY:  No results found.   ASSESSMENT AND PLAN:   81 year old female with past medical history of chronic kidney disease stage III, hypertension, type 2 diabetes without, condition, dementia, osteoarthritis, hypothyroidism who presented to the hospital due to shortness of breath and also mental status.  #1 altered mental status-this is likely multifactorial and related to underlying dementia with sundowning and also complicated with underlying renal failure, delirium due to Ativan. -Mental status has improved . No agitation . Patient received Haldol, few days ago , but did not require any medications recently. Avoiding benzodiazepines.-Continue to follow mental status, overall stable  #2 acute respiratory failure with hypoxia- secondary to CHF, also underlying acute bronchitis/COPD exacerbation. -CT chest showing a moderate right-sided right pleural effusion and a small left pleural effusion and a small pulmonary nodule. Appreciate pulmonary consult , patient would benefit from diuresis. However, patient has poor and worsening kidney function,  diuretics may decline in kidney function even further, this was discussed this family and patient's family was agreeable for patient to be discharged back to group home with hospice.    -Holding  Lasix given renal failure. Tapering steroids, finished Z-Pak. Clinically stable, weaning off oxygen. We may initiate low-dose of diuretics upon discharge home, patient has known worsening of kidney function . She is going to be followed by hospice at home  #3 acute bronchitis  with COPD exacerbation-continue Xopenex nebs, Zithromax is completed. Tapering steroids. Patient has clinically improved.  CT chest showed no airway disease but evidence of bilateral pleural effusions more on the right than the left. Pulmonary consult is appreciated, , patient may benefit from right thoracentesis, but  CHF is likely the reason for pleural effusions.   #4 CHF-acute on chronic combined systolic- diastolic dysfunction. -Patient's echocardiogram showed mild LV dysfunction with EF of 45% and significant pulmonary hypertension. -Appreciate cardiology input. Cont. Coreg. Holding Lasix given poor renal function,.   #5 new-onset atrial fibrillation, now in sinus rhythm-rate controlled and continue Coreg, Cardizem. -Given her advanced age and high fall risk she is not a long-term anticoagulation candidate.  -Continue aspirin. Appreciate cardiology input  #6 . Acute on chronic renal failure with history of chronic kidney disease stage IV  now-patient likely has chronic kidney disease secondary to diabetes hypertension/age.  Kidney function is worse overall, but a likely new baseline as discussed above. Renal US showing no evidence of obstruction but evidence of medical renal disease.  Palliative care discussed case with family and make decision about hospice follow-up at home  #7 depression-continue Prozac  #8 urinary incontinence-continue oxybutynin.  #9 GERD-continue Protonix.  #10 hypothyroidism-continue Synthroid. Patient's TSH is slightly elevated.  Increase her Synthroid dose slightly upon discharge  #11. Social issues. Patient will be discharged to new group home due to financial issues.    All the records are reviewed and case discussed with Care Management/Social Workerr. Management plans discussed with the patient, family and they are in agreement.  CODE STATUS: DO NOT RESUSCITATE  DVT Prophylaxis: Heparin subcutaneous  TOTAL TIME TAKING CARE OF THIS PATIENT: 60 minutes.   Prolonged discussion with patient's power of attorney, Mrs. Caprice Beaver and Mrs. Daye, coordination of care time 25 minutes   .  Theodoro Grist M.D on 12/17/2014  at 9:48 AM  Between 7am to 6pm - Pager - [phone]  After 6pm go to [email or web address] - password [email or web address] [facility]  [facility]  Office  [phone]  CC: Primary care physician; No primary care provider on file.

## 2014-12-17 NOTE — Care Management Important Message (Signed)
Important Message  Patient Details  Name: Paige James MRN: [id number] Date of Birth: 12-Dec-1920   Medicare Important Message Given:  Yes-second notification given    Juliann Pulse A Allmond 12/17/2014, 10:10 AM

## 2014-12-18 LAB — GLUCOSE, CAPILLARY
GLUCOSE-CAPILLARY: 212 mg/dL — AB (ref 65–99)
GLUCOSE-CAPILLARY: 298 mg/dL — AB (ref 65–99)
Glucose-Capillary: 281 mg/dL — ABNORMAL HIGH (ref 65–99)

## 2014-12-18 LAB — PATHOLOGIST SMEAR REVIEW

## 2014-12-18 MED ORDER — METHYLPREDNISOLONE 4 MG PO TBPK: ORAL_TABLET | ORAL | Status: DC

## 2014-12-18 MED ORDER — HYDRALAZINE HCL 25 MG PO TABS: 25.0000 mg | ORAL_TABLET | Freq: Four times a day (QID) | ORAL | Status: DC

## 2014-12-18 MED ORDER — ALBUTEROL SULFATE (2.5 MG/3ML) 0.083% IN NEBU: 2.5000 mg | INHALATION_SOLUTION | Freq: Four times a day (QID) | RESPIRATORY_TRACT | Status: DC | PRN

## 2014-12-18 MED ORDER — LEVOTHYROXINE SODIUM 112 MCG PO TABS: 112.0000 ug | ORAL_TABLET | Freq: Every day | ORAL | Status: DC

## 2014-12-18 MED ORDER — ISOSORBIDE MONONITRATE ER 30 MG PO TB24: 30.0000 mg | ORAL_TABLET | Freq: Every day | ORAL | Status: AC

## 2014-12-18 MED ORDER — DILTIAZEM HCL ER COATED BEADS 120 MG PO CP24
120.0000 mg | ORAL_CAPSULE | Freq: Every day | ORAL | Status: DC
Start: 2014-12-18 — End: 2015-03-10

## 2014-12-18 NOTE — Progress Notes (Signed)
Physical Therapy Treatment Patient Details Name: Paige James MRN: [id number] DOB: [birth year] Today's Date: 12/18/2014    History of Present Illness Pt is a 81 y.o. female presenting to hospital with AMS, hypoxia, and new onset a-fib.  PMH includes dementia, metastatic breast CA s/p R mastectomy with chemotherapy and radiation, s/p B TKR.    PT Comments    Pt progressing strength as noted by less assist to active range of motion with all exercises. Exercises performed on room air with O2 saturation between 89 and 92%. Pt continues to demonstrated bed mobility and transfers with supervision to Modified independent level. Pt demonstrates good safety with hand placement for transfer, but does take increased time to acquire upright position and has difficulty maintaining upright position for long. Ambulation continues without LOB, but ambulates generally too quickly with rolling walker too far in front of body with forward flexed trunk. Ambulation requires supervision and cueing for improved safety. O2 saturation on room air did improve with ambulation to 93%; pt does fatigue quickly (40 ft). Pt has discharge orders to home; recommend 24 hours supervision at home for safety.   Follow Up Recommendations  Home health PT;Supervision for mobility/OOB     Equipment Recommendations  Rolling walker with 5" wheels    Recommendations for Other Services       Precautions / Restrictions Restrictions Weight Bearing Restrictions: No    Mobility  Bed Mobility Overal bed mobility: Modified Independent (Increased time/effort and use of rails) Bed Mobility: Supine to Sit;Sit to Supine     Supine to sit: Modified independent (Device/Increase time) (increased time/effort; rails) Sit to supine: Modified independent (Device/Increase time) (cues to scoot toward center of bed)   General bed mobility comments: Takes increased time and effort and requires cues to scoot to edge of bed; needs assist to  reposition upward in bed; but does assist with BLEs and UEs  Transfers Overall transfer level: Modified independent Equipment used: Rolling walker (2 wheeled) Transfers: Sit to/from Stand Sit to Stand: Supervision;Modified independent (Device/Increase time)         General transfer comment: Demonstrates STS with safe hand placement from bed/bedside commode. Takes increased time to erect to full upright position  Ambulation/Gait Ambulation/Gait assistance: Min guard Ambulation Distance (Feet): 40 Feet Assistive device: Rolling walker (2 wheeled) Gait Pattern/deviations: Step-through pattern;Trunk flexed (rw too far out in front)   Gait velocity interpretation: at or above normal speed for age/gender General Gait Details: Ambulates too quickly for safety and holds rw too far out in front; no overt LOB. Fatigues quickly. O2 saturation increased with ambulation to 93%   Stairs            Wheelchair Mobility    Modified Rankin (Stroke Patients Only)       Balance Overall balance assessment: Needs assistance Sitting-balance support: Feet supported Sitting balance-Leahy Scale: Good     Standing balance support: Bilateral upper extremity supported Standing balance-Leahy Scale: Fair Standing balance comment: trunk flexed; fatigues too quickly to maintain good upright posture                    Cognition Arousal/Alertness: Awake/alert Behavior During Therapy: WFL for tasks assessed/performed Overall Cognitive Status: Within Functional Limits for tasks assessed                      Exercises General Exercises - Lower Extremity Ankle Circles/Pumps: AROM;Both;Supine;20 reps Quad Sets: Strengthening;Both;20 reps;Supine Gluteal Sets: Strengthening;Both;20 reps;Supine Short Arc Quad: AROM;Both;20 reps;Supine  Long Arc Quad: AROM;Both;20 reps;Seated Heel Slides: AROM;Both;20 reps;Supine Hip ABduction/ADduction: AROM;Both;20 reps;Supine Straight Leg Raises:  AAROM;Both;20 reps;Supine Hip Flexion/Marching: AROM;Both;20 reps;Seated    General Comments        Pertinent Vitals/Pain Pain Assessment: No/denies pain    Home Living                      Prior Function            PT Goals (current goals can now be found in the care plan section) Progress towards PT goals: Progressing toward goals    Frequency  Min 2X/week    PT Plan Current plan remains appropriate    Co-evaluation             End of Session Equipment Utilized During Treatment: Gait belt Activity Tolerance: Patient tolerated treatment well (O2 saturation between 89 and 92% with supine/seated exercise) Patient left: in bed;with call bell/phone within reach;with bed alarm set     Time: 7225-7505 PT Time Calculation (min) (ACUTE ONLY): 27 min  Charges:  $Gait Training: 8-22 mins $Therapeutic Exercise: 8-22 mins                    G Codes:      Charlaine Dalton 12/18/2014, 11:38 AM

## 2014-12-18 NOTE — Consult Note (Signed)
  Follow up on [facility] care management referral  Patient had been evaluated for [facility] services.  Patient is not currently eligible for [facility] care management services,due to her discharge plan now being  Hospice services at [facility] care home. I have placed a phone call to DSS guardian [name] to make her aware. For any additional questions please contact.  Joylene Draft, RN, [place] Management/Hospitial Liaison [phone]- Mobile [phone]- [place]

## 2014-12-18 NOTE — Progress Notes (Signed)
Report called to [facility] at [facility].

## 2014-12-18 NOTE — Progress Notes (Signed)
Oxygen removed to see what room air sats are.

## 2014-12-18 NOTE — Progress Notes (Signed)
Spoke with Ander Purpura, [facility] rep at [phone], to notify of non-emergent EMS transport.  Auth notification reference given as [id number].   Service date range good from 12/18/14 - 03/18/15.   Gap exception requested to determine if services can be considered at an in-network level.

## 2014-12-18 NOTE — Progress Notes (Signed)
EMS called for non emergent transport to [facility]

## 2014-12-18 NOTE — Progress Notes (Signed)
Inpatient Diabetes Program Recommendations  AACE/ADA: New Consensus Statement on Inpatient Glycemic Control (2015)  Target Ranges:  Prepandial:   less than 140 mg/dL      Peak postprandial:   less than 180 mg/dL (1-2 hours)      Critically ill patients:  140 - 180 mg/dL   Review of Glycemic Control  Results for Paige James, Paige James (MRN [id number]) as of 12/18/2014 09:13  Ref. Range 12/16/2014 12:22 12/16/2014 16:41 12/17/2014 16:15 12/17/2014 21:31 12/18/2014 07:47  Glucose-Capillary Latest Ref Range: 65-99 mg/dL 254 (H) 323 (H) 291 (H) 227 (H) 212 (H)    Diabetes history: DM2 Outpatient Diabetes medications: Glipizide 5 mg BID Current orders for Inpatient glycemic control: Novolog 0-9 units TID with meals, Novolog 0-5 units HS, Glipizide '5mg'$  bid  Inpatient Diabetes Program Recommendations: Blood sugars more elevated today- likely as a result of steroids.  Given her age, her poor renal function, and steroid taper- agree with current diabetes recommendations.  Gentry Fitz, RN, BA, MHA, CDE Diabetes Coordinator Inpatient Diabetes Program  [phone] (Team Pager) [phone] ([place]) 12/18/2014 9:21 AM

## 2014-12-18 NOTE — Progress Notes (Signed)
Clinical Social Worker informed by [name] MD that patient is medically ready to discharge to family care home. Patient is eager to leave, family and DSS guardian are in a agreement with plan.  Call to [facility] [phone] to inform them patient will discharge today.   CSW updated FL2 and faxed information to facility.  Discharge packet completed.   RN will call report and patient will discharge to [place] via EMS once the hospital bed arrives.  Call was made to DSS guardian [name] to provide an update.  [name] has spoken with patient's daughter to inform her patient will discharge today. CSW signing off, no additional services needed at this time.  Paige James. [name], MSW Clinical Social Work Department [phone] 2:54 PM

## 2014-12-18 NOTE — Progress Notes (Signed)
EMS here to transport pt to [facility]

## 2014-12-18 NOTE — Care Management (Signed)
Patient to discharge today with [facility].  Will have 02, hospital bed and walker.  DSS aware.

## 2014-12-18 NOTE — Progress Notes (Signed)
New referral for Hospice an Paige James services at [facility] after discharge received from [facility]. Patient was admitted to [facility] from [facility] on 10/9 for evaluation of increased shortness of breath and altered mental status. She required oxygen during transport for sats of 83% on room air. Her hospital course has included treatment for systolic congestive heart failure (EF 45-50%),  and alternating IVF and diuresis r/t her CKD stage IV, IV antibiotics and steroids. Chest CT perfomed on 10/13 showed a possible lung nodule in the upper right lung, per chart notes no biopsy is planned. Thoracentesis was performed on 10/20 with 300 ml of fluid drained. PMH includes: Breast Ca with r sided mastectomy, CK stage IV, CHF, DM II, Hypothyroidism, Dementia osteoarthritis and depression. Of note patient has a guardian Paige James [phone]). Writer contacted Paige James to confirm hospice services and to discuss patient's equipment needs at the family care home. Hospital CSW Paige James is verifying the family care home's ability to accept this patient with equipment needs as well as her functional status. Paige James is aware.  Patient seen sitting up in bed, alert, feeding herself breakfast. She was oriented to person, place and situation. She was able to recall "it is voting year' and the month. She reports she was walking with cane at home, she is currently up to the [facility] with assistance. Per Physical therapy note patient has been up to ambulate with a rolling walker but needs supervision with use. Patient is currently on 2 liters of oxygen, nursing to remove and check saturations as patient was not on oxygen at home, she has required with ambulation at the hospital . DME required: Hospital bed and oxygen, awaiting facility verification of acceptance. Patient information faxed to referral intake. CMRN Paige James aware of need for oxygen order. CSW Paige James aware that patient cannot  discharge until family care home agrees to needed equipment and level of care. Will await outcome. Attending physician Dr. Ether James aware of need for plan to be in place prior to discharge. Thank you for the opportunity to be involved in the care of this patient. Paige Shanks RN, BSN, [place] [facility] [place], [facility] [phone] c

## 2014-12-18 NOTE — Discharge Summary (Signed)
[facility] [place] NAME: Faustina Gebert    MR#:  [id number]  DATE OF BIRTH:  04/17/1920  DATE OF ADMISSION:  12/08/2014 ADMITTING PHYSICIAN: Max Sane, MD  DATE OF DISCHARGE: No discharge date for patient encounter.  PRIMARY CARE PHYSICIAN: No primary care provider on file.     ADMISSION DIAGNOSIS:  Acute pulmonary edema (HCC) [J81.0] Hypoxia [R09.02]  DISCHARGE DIAGNOSIS:  Active Problems:   CHF (congestive heart failure) (HCC)   Acute pulmonary edema (HCC)   Acute on chronic combined systolic and diastolic CHF (congestive heart failure) (HCC)   Hypoxia   Acute respiratory failure with hypoxia (HCC)   Persistent atrial fibrillation (HCC)   SOB (shortness of breath)   Wheezing   Atrial fibrillation with RVR (HCC)   Chronic kidney disease (CKD), stage IV (severe) (HCC)   Encephalopathy, metabolic   COPD exacerbation (HCC)   Pleural effusion   Acute delirium   Acute bronchitis   SECONDARY DIAGNOSIS:   Past Medical History  Diagnosis Date  . Renal disorder   . Diabetes mellitus without complication ([place])   . Thyroid disease   . Senile dementia   . Hypertension   . Atopic dermatitis   . Osteoarthritis   . Depressive disorder     .pro HOSPITAL COURSE:  the patient is a 81 year old female with history of metastatic breast cancer status post right-sided mastectomy, diabetes, hypertension, hypothyroidism who presented to the hospital with 3 or 4 days of shortness of breath and weakness. She was found to be in A. Fib. Echo revealed ejection fraction of 45% to 50%. Her chest x-ray revealed bilateral pleural effusions, more on the right, diuresis was tried, however, patient's kidney function worsened. Diuretics were placed on hold and patient received IV fluids with improvement of her kidney function. Then her IV fluids were stopped and her kidney function has worsened again. IV fluids were initiated and stopped numerous  times with no improvement in overall kidney function, signifying kidney function deterioration and the new baseline. Marland Kitchen Respiratory status remained stable despite intermittent IV fluid administration. Patient also had cough with some wheezing, she was started on antibiotic, steroid and inhalation therapy for acute bronchitis and COPD exacerbation with improvement of respiratory status. Now she remains on 2 L of oxygen through nasal cannula. The patient underwent right thoracentesis  12/17/2014 with withdrawal of 300 cc of fluid.  Since kidney function did not improve to prior baseline patient was felt to be CK D stage IV. Palliative care discussed case with family and made decision about hospice follow-up at home. Patient will be transferred assisted living facility today 21st of October 2016 Discussion by problem  #1 altered mental status-this is likely multifactorial and related to underlying dementia with sundowning and also complicated with underlying renal failure, delirium due to Ativan. -Mental status has improved . No agitation . Patient received Haldol few days ago , but did not require any medications recently. Avoiding benzodiazepines.-Continue to follow mental status, overall stable  #2 acute respiratory failure with hypoxia- secondary to CHF, also underlying acute bronchitis/COPD exacerbation. -CT chest showing a moderate right-sided right pleural effusion and a small left pleural effusion and a small pulmonary nodule. Patient underwent right thoracentesis 12/17/2014 , 300 cc of pleural fluid was withdrawn with no significant improvement of respiratory status or oxygenation . Appreciate pulmonary consult , patient would benefit from diuresis. However, patient has poor and worsening kidney function, diuretics may decline in kidney function even  further, this was discussed this family and patient's family was agreeable for patient to be discharged back to group home with hospice.Holding Lasix  given renal failure.  Tapering steroids for COPD exacerbation, finished Z-Pak. Clinically stable, weaning off oxygen. May initiate diuretics upon discharge home, depending on clinical condition, although it is possible that that therapy will decline Her kidney function even further, and patient's family is aware about that . She is going to be followed by hospice at home  #3 acute bronchitis with COPD exacerbation-continue Xopenex nebs, Zithromax is completed. Tapering steroids. Patient has clinically improved. CT chest showed no airway disease but evidence of bilateral pleural effusions more on the right than the left. Pulmonary consult is appreciated, , patient underwent right thoracentesis, but CHF is likely the reason for pleural effusions. Stable clinically  #4 CHF-acute on chronic combined systolic- diastolic dysfunction. -Patient's echocardiogram showed mild LV dysfunction with EF of 45% and significant pulmonary hypertension. -Appreciate cardiology input. Cont. Coreg. Holding Lasix given poor renal function, Lasix may need to be initiated depending on patient's respiratory status despite kidney function.    #5 new-onset atrial fibrillation, now in sinus rhythm-rate controlled and continue Coreg, Cardizem. -Given her advanced age and high fall risk she is not a long-term anticoagulation candidate.  -Continue aspirin. Appreciate cardiology input  #6 . Acute on chronic renal failure , now progressed to chronic kidney disease stage IV -patient likely has chronic kidney disease secondary to diabetes hypertension/age. Kidney function is worse overall, but a likely new baseline as discussed above. Renal US showing no evidence of obstruction but evidence of medical renal disease. Palliative care discussed case with family and make decision about hospice follow-up at home  #7 depression-continue Prozac  #8 urinary incontinence-continue oxybutynin.  #9 GERD-continue Protonix.  #10  hypothyroidism-continue Synthroid. Patient's TSH is slightly elevated. Increase her Synthroid dose slightly upon discharge  #11. Social issues. Patient will be discharged to new visit living facility today 21st of October 2016 discussed with social worker DISCHARGE CONDITIONS:   Stable  CONSULTS OBTAINED:  Treatment Team:  Anthonette Legato, MD  DRUG ALLERGIES:  No Known Allergies  DISCHARGE MEDICATIONS:   Current Discharge Medication List    START taking these medications   Details  albuterol (PROVENTIL) (2.5 MG/3ML) 0.083% nebulizer solution Take 3 mLs (2.5 mg total) by nebulization every 6 (six) hours as needed for wheezing or shortness of breath. Qty: 75 mL, Refills: 12    diltiazem (CARDIZEM CD) 120 MG 24 hr capsule Take 1 capsule (120 mg total) by mouth daily. Qty: 30 capsule, Refills: 6    hydrALAZINE (APRESOLINE) 25 MG tablet Take 1 tablet (25 mg total) by mouth every 6 (six) hours. Qty: 120 tablet, Refills: 6    isosorbide mononitrate (IMDUR) 30 MG 24 hr tablet Take 1 tablet (30 mg total) by mouth daily. Qty: 30 tablet, Refills: 6    methylPREDNISolone (MEDROL DOSEPAK) 4 MG TBPK tablet follow package directions Qty: 21 tablet, Refills: 0      CONTINUE these medications which have CHANGED   Details  levothyroxine (SYNTHROID, LEVOTHROID) 112 MCG tablet Take 1 tablet (112 mcg total) by mouth daily before breakfast. Qty: 30 tablet, Refills: 6      CONTINUE these medications which have NOT CHANGED   Details  FLUoxetine (PROZAC) 10 MG capsule Take 10 mg by mouth daily.    glipiZIDE (GLUCOTROL) 5 MG tablet Take 5 mg by mouth 2 (two) times daily with a meal.    levocetirizine (XYZAL) 5  MG tablet Take 5 mg by mouth daily.    LORazepam (ATIVAN) 0.5 MG tablet Take 0.5 mg by mouth 2 (two) times daily as needed (for agitation).    Multiple Vitamin (MULTIVITAMIN WITH MINERALS) TABS tablet Take 1 tablet by mouth daily.    Olopatadine HCl (PAZEO) 0.7 % SOLN Apply 1 drop  to eye daily.    oxybutynin (DITROPAN-XL) 10 MG 24 hr tablet Take 10 mg by mouth daily.    pantoprazole (PROTONIX) 40 MG tablet Take 40 mg by mouth daily.    sodium bicarbonate 650 MG tablet Take 650 mg by mouth 2 (two) times daily.    Vitamin D, Ergocalciferol, (DRISDOL) 50000 UNITS CAPS capsule Take 50,000 Units by mouth every 7 (seven) days. Pt takes on Wednesday.    Vitamins A & D (VITAMIN A & D) ointment Apply 1 application topically every Monday, Wednesday, and Friday.      STOP taking these medications     amLODipine (NORVASC) 5 MG tablet      carvedilol (COREG) 3.125 MG tablet          DISCHARGE INSTRUCTIONS:    Patient is to follow-up with hospice at home  If you experience worsening of your admission symptoms, develop shortness of breath, life threatening emergency, suicidal or homicidal thoughts you must seek medical attention immediately by calling 911 or calling your MD immediately  if symptoms less severe.  You Must read complete instructions/literature along with all the possible adverse reactions/side effects for all the Medicines you take and that have been prescribed to you. Take any new Medicines after you have completely understood and accept all the possible adverse reactions/side effects.   Please note  You were cared for by a hospitalist during your hospital stay. If you have any questions about your discharge medications or the care you received while you were in the hospital after you are discharged, you can call the unit and asked to speak with the hospitalist on call if the hospitalist that took care of you is not available. Once you are discharged, your primary care physician will handle any further medical issues. Please note that NO REFILLS for any discharge medications will be authorized once you are discharged, as it is imperative that you return to your primary care physician (or establish a relationship with a primary care physician if you do not  have one) for your aftercare needs so that they can reassess your need for medications and monitor your lab values.    Today   CHIEF COMPLAINT:   Chief Complaint  Patient presents with  . Altered Mental Status    HISTORY OF PRESENT ILLNESS:  Liza Czerwinski  is a 81 y.o. female with a known history of metastatic breast cancer status post right-sided mastectomy, diabetes, hypertension, hypothyroidism who presented to the hospital with 3 or 4 days of shortness of breath and weakness. She was found to be in A. Fib. Echo revealed ejection fraction of 45% to 50%. Her chest x-ray revealed bilateral pleural effusions, more on the right, diuresis was tried, however, patient's kidney function worsened. Diuretics were placed on hold and patient received IV fluids with improvement of her kidney function. Then her IV fluids were stopped and her kidney function has worsened again. IV fluids were initiated and stopped numerous times with no improvement in overall kidney function, signifying kidney function deterioration and the new baseline. Marland Kitchen Respiratory status remained stable despite intermittent IV fluid administration. Patient also had cough with some wheezing, she  was started on antibiotic, steroid and inhalation therapy for acute bronchitis and COPD exacerbation with improvement of respiratory status. Now she remains on 2 L of oxygen through nasal cannula. The patient underwent right thoracentesis  12/17/2014 with withdrawal of 300 cc of fluid.  Since kidney function did not improve to prior baseline patient was felt to be CK D stage IV. Palliative care discussed case with family and made decision about hospice follow-up at home. Patient will be transferred assisted living facility today 21st of October 2016 Discussion by problem  #1 altered mental status-this is likely multifactorial and related to underlying dementia with sundowning and also complicated with underlying renal failure, delirium due to  Ativan. -Mental status has improved . No agitation . Patient received Haldol few days ago , but did not require any medications recently. Avoiding benzodiazepines.-Continue to follow mental status, overall stable  #2 acute respiratory failure with hypoxia- secondary to CHF, also underlying acute bronchitis/COPD exacerbation. -CT chest showing a moderate right-sided right pleural effusion and a small left pleural effusion and a small pulmonary nodule. Patient underwent right thoracentesis 12/17/2014 , 300 cc of pleural fluid was withdrawn with no significant improvement of respiratory status or oxygenation . Appreciate pulmonary consult , patient would benefit from diuresis. However, patient has poor and worsening kidney function, diuretics may decline in kidney function even further, this was discussed this family and patient's family was agreeable for patient to be discharged back to group home with hospice.Holding Lasix given renal failure.  Tapering steroids for COPD exacerbation, finished Z-Pak. Clinically stable, weaning off oxygen. May initiate diuretics upon discharge home, depending on clinical condition, although it is possible that that therapy will decline Her kidney function even further, and patient's family is aware about that . She is going to be followed by hospice at home  #3 acute bronchitis with COPD exacerbation-continue Xopenex nebs, Zithromax is completed. Tapering steroids. Patient has clinically improved. CT chest showed no airway disease but evidence of bilateral pleural effusions more on the right than the left. Pulmonary consult is appreciated, , patient underwent right thoracentesis, but CHF is likely the reason for pleural effusions. Stable clinically  #4 CHF-acute on chronic combined systolic- diastolic dysfunction. -Patient's echocardiogram showed mild LV dysfunction with EF of 45% and significant pulmonary hypertension. -Appreciate cardiology input. Cont. Coreg.  Holding Lasix given poor renal function, Lasix may need to be initiated depending on patient's respiratory status despite kidney function.    #5 new-onset atrial fibrillation, now in sinus rhythm-rate controlled and continue Coreg, Cardizem. -Given her advanced age and high fall risk she is not a long-term anticoagulation candidate.  -Continue aspirin. Appreciate cardiology input  #6 . Acute on chronic renal failure , now progressed to chronic kidney disease stage IV -patient likely has chronic kidney disease secondary to diabetes hypertension/age. Kidney function is worse overall, but a likely new baseline as discussed above. Renal US showing no evidence of obstruction but evidence of medical renal disease. Palliative care discussed case with family and make decision about hospice follow-up at home  #7 depression-continue Prozac  #8 urinary incontinence-continue oxybutynin.  #9 GERD-continue Protonix.  #10 hypothyroidism-continue Synthroid. Patient's TSH is slightly elevated. Increase her Synthroid dose slightly upon discharge  #11. Social issues. Patient will be discharged to new visit living facility today 21st of October 2016 discussed with social worker    VITAL SIGNS:  Blood pressure 135/85, pulse 73, temperature 98.3 F (36.8 C), temperature source Oral, resp. rate 18, height '5\' 5"'$  (1.651 m),  weight 86.864 kg (191 lb 8 oz), SpO2 96 %.  I/O:   Intake/Output Summary (Last 24 hours) at 12/18/14 1346 Last data filed at 12/18/14 0824  Gross per 24 hour  Intake      3 ml  Output    850 ml  Net   -847 ml    PHYSICAL EXAMINATION:  GENERAL:  81 y.o.-year-old patient lying in the bed with no acute distress.  EYES: Pupils equal, round, reactive to light and accommodation. No scleral icterus. Extraocular muscles intact.  HEENT: Head atraumatic, normocephalic. Oropharynx and nasopharynx clear.  NECK:  Supple, no jugular venous distention. No thyroid enlargement, no tenderness.   LUNGS: Normal breath sounds bilaterally, no wheezing, rales,rhonchi or crepitation. No use of accessory muscles of respiration.  CARDIOVASCULAR: S1, S2 normal. No murmurs, rubs, or gallops.  ABDOMEN: Soft, non-tender, non-distended. Bowel sounds present. No organomegaly or mass.  EXTREMITIES: No pedal edema, cyanosis, or clubbing.  NEUROLOGIC: Cranial nerves II through XII are intact. Muscle strength 5/5 in all extremities. Sensation intact. Gait not checked.  PSYCHIATRIC: The patient is alert and oriented x 3.  SKIN: No obvious rash, lesion, or ulcer.   DATA REVIEW:   CBC  Recent Labs Lab 12/13/14 0452 12/16/14 0509  WBC 6.2  --   HGB 8.4* 8.5*  HCT 25.5*  --   PLT 182  --     Chemistries   Recent Labs Lab 12/16/14 0509  NA 135  K 5.1  CL 104  CO2 22  GLUCOSE 290*  BUN 85*  CREATININE 3.11*  CALCIUM 8.5*    Cardiac Enzymes No results for input(s): TROPONINI in the last 168 hours.  Microbiology Results  No results found for this or any previous visit.  RADIOLOGY:  Dg Chest 1 View  12/17/2014  CLINICAL DATA:  81 year old female status post right-sided thoracentesis EXAM: CHEST 1 VIEW COMPARISON:  Prior chest x-ray 12/15/2014 FINDINGS: Stable cardiomegaly. Aortic atherosclerosis. No evidence of right-sided pneumothorax. Total interval resolution of right pleural effusion. Persistent small left pleural effusion with associated basilar atelectasis. IMPRESSION: Interval resolution of right-sided pleural effusion following thoracentesis. No evidence of pneumothorax. Persistent small left layering effusion and associated basilar atelectasis. Electronically Signed   By: Jacqulynn Cadet M.D.   On: 12/17/2014 17:19   US Thoracentesis Asp Pleural Space W/img Guide  12/17/2014  CLINICAL DATA:  81 year old female with right larger than left bilateral pleural effusions EXAM: ULTRASOUND GUIDED RIGHT THORACENTESIS COMPARISON:  Chest x-ray 12/15/2014 PROCEDURE: An ultrasound  guided thoracentesis was thoroughly discussed with the patient and questions answered. The benefits, risks, alternatives and complications were also discussed. The patient understands and wishes to proceed with the procedure. Written consent was obtained. Ultrasound was performed to localize and mark an adequate pocket of fluid in the right chest. The area was then prepped and draped in the normal sterile fashion. 1% Lidocaine was used for local anesthesia. Under ultrasound guidance a 6 [place] Safe T centesis catheter was introduced. Thoracentesis was performed. The catheter was removed and a dressing applied. COMPLICATIONS: None. FINDINGS: A total of approximately 300 mL of yellow colored pleural fluid was removed. A fluid sample wassent for laboratory analysis. IMPRESSION: Successful ultrasound guided right thoracentesis yielding 300 mL yellow pleural fluid. Electronically Signed   By: Jacqulynn Cadet M.D.   On: 12/17/2014 17:20    EKG:   Orders placed or performed during the hospital encounter of 12/08/14  . EKG 12-Lead  . EKG 12-Lead  . ED EKG  .  ED EKG      Management plans discussed with the patient, family and they are in agreement.  CODE STATUS:     Code Status Orders        Start     Ordered   12/08/14 2032  Do not attempt resuscitation (DNR)   Continuous    Question Answer Comment  In the event of cardiac or respiratory ARREST Do not call a "code blue"   In the event of cardiac or respiratory ARREST Do not perform Intubation, CPR, defibrillation or ACLS   In the event of cardiac or respiratory ARREST Use medication by any route, position, wound care, and other measures to relive pain and suffering. May use oxygen, suction and manual treatment of airway obstruction as needed for comfort.      12/08/14 2031      TOTAL TIME TAKING CARE OF THIS PATIENT: 40 minutes.    Theodoro Grist M.D on 12/18/2014 at 1:46 PM  Between 7am to 6pm - Pager - [phone]  After 6pm go  to [email or web address] - password [email or web address] [facility]  [facility]  Office  [phone]  CC: Primary care physician; No primary care provider on file.

## 2014-12-21 ENCOUNTER — Emergency Department: Payer: Medicare Other

## 2014-12-21 ENCOUNTER — Encounter: Payer: Self-pay | Admitting: Emergency Medicine

## 2014-12-21 ENCOUNTER — Observation Stay
Admission: EM | Admit: 2014-12-21 | Discharge: 2014-12-22 | Disposition: A | Discharge status: Skilled Nursing Facility | Payer: Medicare Other | Attending: Internal Medicine | Admitting: Internal Medicine

## 2014-12-21 DIAGNOSIS — R52 Pain, unspecified: Secondary | ICD-10-CM | POA: Diagnosis present

## 2014-12-21 DIAGNOSIS — I5020 Unspecified systolic (congestive) heart failure: Secondary | ICD-10-CM | POA: Diagnosis not present

## 2014-12-21 DIAGNOSIS — E1122 Type 2 diabetes mellitus with diabetic chronic kidney disease: Secondary | ICD-10-CM | POA: Diagnosis not present

## 2014-12-21 DIAGNOSIS — Z7984 Long term (current) use of oral hypoglycemic drugs: Secondary | ICD-10-CM | POA: Insufficient documentation

## 2014-12-21 DIAGNOSIS — Z806 Family history of leukemia: Secondary | ICD-10-CM | POA: Insufficient documentation

## 2014-12-21 DIAGNOSIS — Z853 Personal history of malignant neoplasm of breast: Secondary | ICD-10-CM | POA: Insufficient documentation

## 2014-12-21 DIAGNOSIS — Z9011 Acquired absence of right breast and nipple: Secondary | ICD-10-CM | POA: Insufficient documentation

## 2014-12-21 DIAGNOSIS — M79609 Pain in unspecified limb: Secondary | ICD-10-CM | POA: Diagnosis not present

## 2014-12-21 DIAGNOSIS — M7989 Other specified soft tissue disorders: Secondary | ICD-10-CM | POA: Diagnosis not present

## 2014-12-21 DIAGNOSIS — I130 Hypertensive heart and chronic kidney disease with heart failure and stage 1 through stage 4 chronic kidney disease, or unspecified chronic kidney disease: Secondary | ICD-10-CM | POA: Diagnosis not present

## 2014-12-21 DIAGNOSIS — Z79899 Other long term (current) drug therapy: Secondary | ICD-10-CM | POA: Diagnosis not present

## 2014-12-21 DIAGNOSIS — M25552 Pain in left hip: Secondary | ICD-10-CM | POA: Diagnosis not present

## 2014-12-21 DIAGNOSIS — M25562 Pain in left knee: Secondary | ICD-10-CM | POA: Diagnosis not present

## 2014-12-21 DIAGNOSIS — N184 Chronic kidney disease, stage 4 (severe): Secondary | ICD-10-CM | POA: Diagnosis not present

## 2014-12-21 DIAGNOSIS — I10 Essential (primary) hypertension: Secondary | ICD-10-CM | POA: Diagnosis not present

## 2014-12-21 DIAGNOSIS — M8580 Other specified disorders of bone density and structure, unspecified site: Secondary | ICD-10-CM | POA: Insufficient documentation

## 2014-12-21 DIAGNOSIS — Z9889 Other specified postprocedural states: Secondary | ICD-10-CM | POA: Diagnosis not present

## 2014-12-21 DIAGNOSIS — Z800 Family history of malignant neoplasm of digestive organs: Secondary | ICD-10-CM | POA: Insufficient documentation

## 2014-12-21 DIAGNOSIS — R918 Other nonspecific abnormal finding of lung field: Secondary | ICD-10-CM | POA: Diagnosis not present

## 2014-12-21 DIAGNOSIS — I4891 Unspecified atrial fibrillation: Secondary | ICD-10-CM | POA: Diagnosis not present

## 2014-12-21 DIAGNOSIS — E039 Hypothyroidism, unspecified: Secondary | ICD-10-CM | POA: Insufficient documentation

## 2014-12-21 DIAGNOSIS — K5900 Constipation, unspecified: Secondary | ICD-10-CM

## 2014-12-21 DIAGNOSIS — J849 Interstitial pulmonary disease, unspecified: Secondary | ICD-10-CM | POA: Diagnosis not present

## 2014-12-21 DIAGNOSIS — N390 Urinary tract infection, site not specified: Secondary | ICD-10-CM | POA: Diagnosis not present

## 2014-12-21 DIAGNOSIS — M79605 Pain in left leg: Secondary | ICD-10-CM

## 2014-12-21 DIAGNOSIS — N179 Acute kidney failure, unspecified: Secondary | ICD-10-CM | POA: Diagnosis not present

## 2014-12-21 DIAGNOSIS — R0989 Other specified symptoms and signs involving the circulatory and respiratory systems: Secondary | ICD-10-CM | POA: Insufficient documentation

## 2014-12-21 DIAGNOSIS — F0390 Unspecified dementia without behavioral disturbance: Secondary | ICD-10-CM | POA: Insufficient documentation

## 2014-12-21 DIAGNOSIS — M25569 Pain in unspecified knee: Secondary | ICD-10-CM | POA: Diagnosis present

## 2014-12-21 DIAGNOSIS — I723 Aneurysm of iliac artery: Secondary | ICD-10-CM | POA: Diagnosis not present

## 2014-12-21 DIAGNOSIS — Z8042 Family history of malignant neoplasm of prostate: Secondary | ICD-10-CM | POA: Diagnosis not present

## 2014-12-21 DIAGNOSIS — M1990 Unspecified osteoarthritis, unspecified site: Secondary | ICD-10-CM | POA: Diagnosis not present

## 2014-12-21 HISTORY — DX: Unspecified atrial fibrillation: I48.91

## 2014-12-21 HISTORY — DX: Heart failure, unspecified: I50.9

## 2014-12-21 HISTORY — DX: Malignant neoplasm of unspecified site of unspecified female breast: C50.919

## 2014-12-21 LAB — CBC
HCT: 31.9 % — ABNORMAL LOW (ref 35.0–47.0)
HEMOGLOBIN: 10.5 g/dL — AB (ref 12.0–16.0)
MCH: 33.2 pg (ref 26.0–34.0)
MCHC: 32.9 g/dL (ref 32.0–36.0)
MCV: 100.7 fL — ABNORMAL HIGH (ref 80.0–100.0)
Platelets: 175 10*3/uL (ref 150–440)
RBC: 3.17 MIL/uL — AB (ref 3.80–5.20)
RDW: 16.2 % — ABNORMAL HIGH (ref 11.5–14.5)
WBC: 6.9 10*3/uL (ref 3.6–11.0)

## 2014-12-21 LAB — URIC ACID: URIC ACID, SERUM: 11.2 mg/dL — AB (ref 2.3–6.6)

## 2014-12-21 LAB — URINALYSIS COMPLETE WITH MICROSCOPIC (ARMC ONLY)
Bacteria, UA: NONE SEEN
Bilirubin Urine: NEGATIVE
Glucose, UA: NEGATIVE mg/dL
Hgb urine dipstick: NEGATIVE
KETONES UR: NEGATIVE mg/dL
NITRITE: NEGATIVE
PROTEIN: 100 mg/dL — AB
SPECIFIC GRAVITY, URINE: 1.011 (ref 1.005–1.030)
Squamous Epithelial / LPF: NONE SEEN
pH: 5 (ref 5.0–8.0)

## 2014-12-21 LAB — GLUCOSE, CAPILLARY
GLUCOSE-CAPILLARY: 241 mg/dL — AB (ref 65–99)
Glucose-Capillary: 221 mg/dL — ABNORMAL HIGH (ref 65–99)

## 2014-12-21 MED ORDER — VITAMIN D (ERGOCALCIFEROL) 1.25 MG (50000 UNIT) PO CAPS: 50000.0000 [IU] | ORAL_CAPSULE | ORAL | Status: DC

## 2014-12-21 MED ORDER — CETIRIZINE HCL 10 MG PO TABS
10.0000 mg | ORAL_TABLET | Freq: Every day | ORAL | Status: DC
  Administered 2014-12-22: 10 mg via ORAL
  Filled 2014-12-21 (×2): qty 1

## 2014-12-21 MED ORDER — PANTOPRAZOLE SODIUM 40 MG PO TBEC
40.0000 mg | DELAYED_RELEASE_TABLET | Freq: Every day | ORAL | Status: DC
  Administered 2014-12-22: 40 mg via ORAL
  Filled 2014-12-21: qty 1

## 2014-12-21 MED ORDER — SULFAMETHOXAZOLE-TRIMETHOPRIM 800-160 MG PO TABS
1.0000 | ORAL_TABLET | Freq: Once | ORAL | Status: AC
  Administered 2014-12-21: 1 via ORAL
  Filled 2014-12-21: qty 1

## 2014-12-21 MED ORDER — ALBUTEROL SULFATE (2.5 MG/3ML) 0.083% IN NEBU: 2.5000 mg | INHALATION_SOLUTION | Freq: Four times a day (QID) | RESPIRATORY_TRACT | Status: DC | PRN

## 2014-12-21 MED ORDER — HEPARIN SODIUM (PORCINE) 5000 UNIT/ML IJ SOLN
5000.0000 [IU] | Freq: Three times a day (TID) | INTRAMUSCULAR | Status: DC
  Administered 2014-12-21 – 2014-12-22 (×3): 5000 [IU] via SUBCUTANEOUS
  Filled 2014-12-21 (×3): qty 1

## 2014-12-21 MED ORDER — CLOBETASOL PROPIONATE 0.05 % EX CREA
1.0000 "application " | TOPICAL_CREAM | Freq: Two times a day (BID) | CUTANEOUS | Status: DC
  Administered 2014-12-21 – 2014-12-22 (×2): 1 via TOPICAL
  Filled 2014-12-21: qty 15

## 2014-12-21 MED ORDER — VITAMIN D (ERGOCALCIFEROL) 1.25 MG (50000 UNIT) PO CAPS
50000.0000 [IU] | ORAL_CAPSULE | ORAL | Status: DC
  Filled 2014-12-21: qty 1

## 2014-12-21 MED ORDER — ACETAMINOPHEN 325 MG PO TABS: 650.0000 mg | ORAL_TABLET | Freq: Four times a day (QID) | ORAL | Status: DC | PRN

## 2014-12-21 MED ORDER — ONDANSETRON HCL 4 MG/2ML IJ SOLN
4.0000 mg | Freq: Four times a day (QID) | INTRAMUSCULAR | Status: DC | PRN
Start: 2014-12-21 — End: 2014-12-22

## 2014-12-21 MED ORDER — ONDANSETRON HCL 4 MG PO TABS: 4.0000 mg | ORAL_TABLET | Freq: Four times a day (QID) | ORAL | Status: DC | PRN

## 2014-12-21 MED ORDER — SODIUM CHLORIDE 0.9 % IV SOLN
INTRAVENOUS | Status: DC
  Administered 2014-12-21: 17:00:00 via INTRAVENOUS

## 2014-12-21 MED ORDER — ISOSORBIDE MONONITRATE ER 30 MG PO TB24
30.0000 mg | ORAL_TABLET | Freq: Every day | ORAL | Status: DC
  Administered 2014-12-22: 30 mg via ORAL
  Filled 2014-12-21: qty 1

## 2014-12-21 MED ORDER — OXYCODONE HCL 5 MG PO TABS: 5.0000 mg | ORAL_TABLET | ORAL | Status: DC | PRN

## 2014-12-21 MED ORDER — POLYETHYLENE GLYCOL 3350 17 G PO PACK: 17.0000 g | PACK | Freq: Every day | ORAL | Status: DC | PRN

## 2014-12-21 MED ORDER — LEVOTHYROXINE SODIUM 112 MCG PO TABS: 112.0000 ug | ORAL_TABLET | Freq: Every day | ORAL | Status: DC

## 2014-12-21 MED ORDER — INSULIN ASPART 100 UNIT/ML ~~LOC~~ SOLN
0.0000 [IU] | Freq: Three times a day (TID) | SUBCUTANEOUS | Status: DC
  Administered 2014-12-21: 3 [IU] via SUBCUTANEOUS
  Administered 2014-12-22: 2 [IU] via SUBCUTANEOUS
  Administered 2014-12-22: 1 [IU] via SUBCUTANEOUS
  Filled 2014-12-21: qty 2
  Filled 2014-12-21: qty 1
  Filled 2014-12-21: qty 5

## 2014-12-21 MED ORDER — CARVEDILOL 3.125 MG PO TABS
3.1250 mg | ORAL_TABLET | Freq: Two times a day (BID) | ORAL | Status: DC
  Administered 2014-12-21 – 2014-12-22 (×2): 3.125 mg via ORAL
  Filled 2014-12-21 (×2): qty 1

## 2014-12-21 MED ORDER — GLIPIZIDE 5 MG PO TABS
5.0000 mg | ORAL_TABLET | Freq: Two times a day (BID) | ORAL | Status: DC
  Administered 2014-12-21 – 2014-12-22 (×2): 5 mg via ORAL
  Filled 2014-12-21 (×2): qty 1

## 2014-12-21 MED ORDER — HYDRALAZINE HCL 20 MG/ML IJ SOLN
10.0000 mg | Freq: Once | INTRAMUSCULAR | Status: AC
  Administered 2014-12-21: 5 mg via INTRAVENOUS
  Filled 2014-12-21: qty 1

## 2014-12-21 MED ORDER — DILTIAZEM HCL ER COATED BEADS 120 MG PO CP24
120.0000 mg | ORAL_CAPSULE | Freq: Every day | ORAL | Status: DC
  Administered 2014-12-22: 120 mg via ORAL
  Filled 2014-12-21: qty 1

## 2014-12-21 MED ORDER — OXYBUTYNIN CHLORIDE ER 10 MG PO TB24
10.0000 mg | ORAL_TABLET | Freq: Every day | ORAL | Status: DC
  Administered 2014-12-22: 10 mg via ORAL
  Filled 2014-12-21: qty 1

## 2014-12-21 MED ORDER — AMLODIPINE BESYLATE 5 MG PO TABS
5.0000 mg | ORAL_TABLET | Freq: Every day | ORAL | Status: DC
  Administered 2014-12-22: 5 mg via ORAL
  Filled 2014-12-21: qty 1

## 2014-12-21 MED ORDER — FLUOXETINE HCL 10 MG PO CAPS
10.0000 mg | ORAL_CAPSULE | Freq: Every day | ORAL | Status: DC
  Administered 2014-12-22: 10 mg via ORAL
  Filled 2014-12-21 (×2): qty 1

## 2014-12-21 MED ORDER — ACETAMINOPHEN 650 MG RE SUPP: 650.0000 mg | Freq: Four times a day (QID) | RECTAL | Status: DC | PRN

## 2014-12-21 MED ORDER — OLOPATADINE HCL 0.1 % OP SOLN
1.0000 [drp] | Freq: Every day | OPHTHALMIC | Status: DC
  Administered 2014-12-22: 1 [drp] via OPHTHALMIC
  Filled 2014-12-21: qty 5

## 2014-12-21 MED ORDER — INSULIN ASPART 100 UNIT/ML ~~LOC~~ SOLN
0.0000 [IU] | Freq: Every day | SUBCUTANEOUS | Status: DC
  Administered 2014-12-21: 2 [IU] via SUBCUTANEOUS
  Filled 2014-12-21: qty 2

## 2014-12-21 MED ORDER — HYDRALAZINE HCL 25 MG PO TABS
25.0000 mg | ORAL_TABLET | Freq: Four times a day (QID) | ORAL | Status: DC
  Administered 2014-12-21 – 2014-12-22 (×3): 25 mg via ORAL
  Filled 2014-12-21 (×3): qty 1

## 2014-12-21 MED ORDER — ADULT MULTIVITAMIN W/MINERALS CH
1.0000 | ORAL_TABLET | Freq: Every day | ORAL | Status: DC
  Administered 2014-12-22: 1 via ORAL
  Filled 2014-12-21: qty 1

## 2014-12-21 MED ORDER — MORPHINE SULFATE (PF) 2 MG/ML IV SOLN: 2.0000 mg | INTRAVENOUS | Status: DC | PRN

## 2014-12-21 MED ORDER — SODIUM BICARBONATE 650 MG PO TABS
650.0000 mg | ORAL_TABLET | Freq: Two times a day (BID) | ORAL | Status: DC
  Administered 2014-12-21 – 2014-12-22 (×2): 650 mg via ORAL
  Filled 2014-12-21 (×2): qty 1

## 2014-12-21 NOTE — ED Notes (Signed)
MD at bedside. 

## 2014-12-21 NOTE — Care Management Note (Signed)
Case Management Note  Patient Details  Name: Paige James MRN: [id number] Date of Birth: 1921-02-14  Subjective/Objective:  I have called both [place] guardian for the pt. And left her a voice mail to call us-[phone]. I also sent a text to karen Alford Highland who is with [facility] and got the pt. In referral for services.She has acknowledged the text, and will come see the pt.                  Action/Plan:   Expected Discharge Date:                  Expected Discharge Plan:     In-House Referral:     Discharge planning Services     Post Acute Care Choice:    Choice offered to:     DME Arranged:    DME Agency:     HH Arranged:    [place] Agency:     Status of Service:     Medicare Important Message Given:    Date Medicare IM Given:    Medicare IM give by:    Date Additional Medicare IM Given:    Additional Medicare Important Message give by:     If discussed at [place] of Stay Meetings, dates discussed:    Additional Comments:  Beau Fanny, RN 12/21/2014, 1:37 PM

## 2014-12-21 NOTE — H&P (Addendum)
[facility] NAME: Paige James    MR#:  [id number]  DATE OF BIRTH:  February 02, 1921  DATE OF ADMISSION:  12/21/2014  PRIMARY CARE PHYSICIAN: Dr. Clayborn Bigness  REQUESTING/REFERRING PHYSICIAN: Dr. Ella Jubilee  CHIEF COMPLAINT:   Chief Complaint  Patient presents with  . Leg Pain    HISTORY OF PRESENT ILLNESS:  Paige James  is a 81 y.o. female with a known history of systolic CHF, CK 50, dementia, arthritis and atrial fibrillation who was recently in the hospital until last week was discharged to assisted living facility brought back secondary to left knee pain and inability to walk. Patient due to her history of dementia and is unable to provide any history. The rest of her history is obtained from her daughter at bedside and also from previous medical records. Patient was just discharged from hospital on 12/18/2014 after prolonged stay for atrial fibrillation,, COPD and CHF exacerbation. She was discharged to an assisted living facility, was ambulating with a walker at the time of discharge. She was fine on the day of discharge in the next day she complained of some left knee pain. Not sure why they waited so long, today she couldn't even get herself up to even move and so was brought to the emergency room. No fevers or date. Left knee is tender slightly swollen than baseline. X-rays are pending at this time.  PAST MEDICAL HISTORY:   Past Medical History  Diagnosis Date  . Renal disorder     CKD  . Diabetes mellitus without complication ([place])   . Thyroid disease     Hypothyroidism  . Senile dementia   . Hypertension   . Atopic dermatitis   . Osteoarthritis   . Depressive disorder   . Atrial fibrillation ([place])   . Breast cancer ([place])   . Congestive heart failure (CHF) (H[facility])     Ejection fraction of 45-50%    PAST SURGICAL HISTORY:   Past Surgical History  Procedure Laterality Date  . Cataract surgery    .  Mastectomy      right sided mastectomy  . Hip surgery    . Right knee surgery    . Left knee surgery      SOCIAL HISTORY:   Social History  Substance Use Topics  . Smoking status: Never Smoker   . Smokeless tobacco: Not on file  . Alcohol Use: No    FAMILY HISTORY:   Family History  Problem Relation Age of Onset  . Cancer - Prostate Brother   . Colon cancer Daughter   . Leukemia Daughter     DRUG ALLERGIES:  No Known Allergies  REVIEW OF SYSTEMS:   Review of Systems  Unable to perform ROS: dementia  except complaining of significant left knee pain  MEDICATIONS AT HOME:   Prior to Admission medications   Medication Sig Start Date End Date Taking? Authorizing Provider  amLODipine (NORVASC) 5 MG tablet Take 5 mg by mouth daily.   Yes Historical Provider, MD  carvedilol (COREG) 3.125 MG tablet Take 3.125 mg by mouth 2 (two) times daily with a meal.   Yes Historical Provider, MD  clobetasol cream (TEMOVATE) 1.54 % Apply 1 application topically 2 (two) times daily as needed (for bilateral lower legs and feet).   Yes Historical Provider, MD  FLUoxetine (PROZAC) 10 MG capsule Take 10 mg by mouth daily.   Yes Historical Provider, MD  glipiZIDE (GLUCOTROL) 5 MG tablet  Take 5 mg by mouth 2 (two) times daily with a meal.   Yes Historical Provider, MD  levocetirizine (XYZAL) 5 MG tablet Take 5 mg by mouth daily.   Yes Historical Provider, MD  levothyroxine (SYNTHROID, LEVOTHROID) 100 MCG tablet Take 100 mcg by mouth daily.   Yes Historical Provider, MD  Multiple Vitamin (THEREMS PO) Take 1 tablet by mouth daily.   Yes Historical Provider, MD  Olopatadine HCl (PAZEO) 0.7 % SOLN Place 1 drop into both eyes daily.    Yes Historical Provider, MD  oxybutynin (DITROPAN-XL) 10 MG 24 hr tablet Take 10 mg by mouth daily.   Yes Historical Provider, MD  pantoprazole (PROTONIX) 40 MG tablet Take 40 mg by mouth daily.   Yes Historical Provider, MD  sodium bicarbonate 650 MG tablet Take 650 mg  by mouth 2 (two) times daily.   Yes Historical Provider, MD  Vitamin D, Ergocalciferol, (DRISDOL) 50000 UNITS CAPS capsule Take 50,000 Units by mouth every 7 (seven) days. Pt takes on Wednesday.   Yes Historical Provider, MD  albuterol (PROVENTIL) (2.5 MG/3ML) 0.083% nebulizer solution Take 3 mLs (2.5 mg total) by nebulization every 6 (six) hours as needed for wheezing or shortness of breath. Patient not taking: Reported on 12/21/2014 12/18/14   Theodoro Grist, MD  diltiazem (CARDIZEM CD) 120 MG 24 hr capsule Take 1 capsule (120 mg total) by mouth daily. Patient not taking: Reported on 12/21/2014 12/18/14   Theodoro Grist, MD  hydrALAZINE (APRESOLINE) 25 MG tablet Take 1 tablet (25 mg total) by mouth every 6 (six) hours. Patient not taking: Reported on 12/21/2014 12/18/14   Theodoro Grist, MD  isosorbide mononitrate (IMDUR) 30 MG 24 hr tablet Take 1 tablet (30 mg total) by mouth daily. Patient not taking: Reported on 12/21/2014 12/18/14   Theodoro Grist, MD  levothyroxine (SYNTHROID, LEVOTHROID) 112 MCG tablet Take 1 tablet (112 mcg total) by mouth daily before breakfast. Patient not taking: Reported on 12/21/2014 12/18/14   Theodoro Grist, MD  methylPREDNISolone (MEDROL DOSEPAK) 4 MG TBPK tablet follow package directions Patient not taking: Reported on 12/21/2014 12/18/14   Theodoro Grist, MD      VITAL SIGNS:  Blood pressure 171/88, pulse 91, temperature 98.4 F (36.9 C), temperature source Oral, resp. rate 16, height '5\' 7"'$  (1.702 m), weight 91.173 kg (201 lb), SpO2 97 %.  PHYSICAL EXAMINATION:   Physical Exam  GENERAL:  81 y.o.-year-old patient lying in the bed with some acute distress due to left knee pain.  EYES: Pupils equal, round, reactive to light and accommodation. No scleral icterus. Extraocular muscles intact.  HEENT: Head atraumatic, normocephalic. Oropharynx and nasopharynx clear.  NECK:  Supple, no jugular venous distention. No thyroid enlargement, no tenderness.  LUNGS: Normal  breath sounds bilaterally, no wheezing, rales,rhonchi or crepitation. No use of accessory muscles of respiration. Decreased bibasilar breath sounds CARDIOVASCULAR: S1, S2 normal. No  rubs, or gallops. 3/6 systolic murmur present ABDOMEN: Soft, nontender, nondistended. Bowel sounds present. No organomegaly or mass.  EXTREMITIES: Right knee normal to exam, changes of osteoarthritis present. Left knee slightly warm to touch more swelling than normal and patient is not going to do an exam due to significant tenderness of the knee. Generalized tenderness at this point. No pedal edema, cyanosis, or clubbing.  NEUROLOGIC: Cranial nerves II through XII are intact. Muscle strength 5/5 in all extremities except left leg is not tested due to pain. Sensation intact. Gait not checked.  PSYCHIATRIC: The patient is alert and oriented to self.  SKIN: No obvious rash, lesion, or ulcer.   LABORATORY PANEL:   CBC  Recent Labs Lab 12/16/14 0509  HGB 8.5*   ------------------------------------------------------------------------------------------------------------------  Chemistries   Recent Labs Lab 12/16/14 0509  NA 135  K 5.1  CL 104  CO2 22  GLUCOSE 290*  BUN 85*  CREATININE 3.11*  CALCIUM 8.5*   ------------------------------------------------------------------------------------------------------------------  Cardiac Enzymes No results for input(s): TROPONINI in the last 168 hours. ------------------------------------------------------------------------------------------------------------------  RADIOLOGY:  Dg Chest 1 View  12/21/2014  CLINICAL DATA:  Hip pain.  Renal disorder. EXAM: CHEST 1 VIEW COMPARISON:  12/17/2014. FINDINGS: Improved inspiration. Progressive enlargement of the cardiac silhouette. Stable prominence of the pulmonary vasculature and interstitial markings. No pleural fluid. Mild scoliosis. Diffuse osteopenia. IMPRESSION: 1. Progressive cardiomegaly. 2. Improved  inspiration with grossly stable mild pulmonary vascular congestion and chronic interstitial lung disease. Electronically Signed   By: Claudie Revering M.D.   On: 12/21/2014 12:43   Dg Hip Unilat With Pelvis 2-3 Views Left  12/21/2014  CLINICAL DATA:  Pain and inability to stand EXAM: DG HIP (WITH OR WITHOUT PELVIS) 2-3V LEFT COMPARISON:  None. FINDINGS: Frontal pelvis as well as frontal and lateral left hip images were obtained. Bones are osteoporotic. There is no demonstrable fracture or dislocation. There is mild symmetric narrowing of both hip joints. No erosive change. There are scattered foci of arterial vascular calcification. There is evidence of an aneurysm of the distal common iliac artery on the right measuring 3.3 cm. IMPRESSION: Distal right common iliac artery aneurysm. No fracture or dislocation. Bones osteoporotic. Narrowing both hip joints, symmetric. Electronically Signed   By: Lowella Grip III M.D.   On: 12/21/2014 12:42    EKG:   Orders placed or performed during the hospital encounter of 12/08/14  . EKG 12-Lead  . EKG 12-Lead  . ED EKG  . ED EKG  . EKG    IMPRESSION AND PLAN:   Paige James  is a 81 y.o. female with a known history of systolic CHF, CK 50, dementia, arthritis and atrial fibrillation who was recently in the hospital until last week was discharged to assisted living facility brought back secondary to left knee pain and inability to walk.  #1 left knee pain-significant pain and swelling of her left knee. -Could be gout or pseudogout. Has significant osteoarthritis. Also status post surgery on that knee in the past. -Follow up x-rays, if effusion noted-Will consult Ortho, for tapping. -Pain medications when necessary for now. Uric acid level ordered. -Physical therapy consult.  #2 hypertension-elevated currently. -Continue on her oral home medications. Patient on Coreg, Norvasc, Cardizem, hydralazine and Imdur.  #3 atrial fibrillation-new diagnosis  during last hospitalization. Seems to be in sinus rhythm now. -Continue Cardizem and Coreg for rate control. -Not on any anticoagulation. Likely due to her age and also risk of falls. -Aspirin can be added if her hemoglobin is stable at the time of discharge -Last echo with EF of 45-50%. Well compensated at this time. Not in any diuretics. -Also had thoracentesis done and her last admission. Currently not needing any oxygen  #4 diabetes mellitus-last A1c is 6.0. -Continue her oral medications. Also put on sliding scale insulin.  #5 dementia-patient seems to be at baseline. Oriented to self. Pleasant conversation at times. -Significant delirium, sundowning during last hospitalization. She'll continue to monitor.  #6 ARF on CKD- with elevated BUN/cr - no evidence of fluid overload, gentle hydration for 1 day with her poor intake - known CKD stage4  #7  DVT prophylaxis-subcutaneous heparin  Social worker consult at for placement. Might need discharge to rehabilitation  All the records are reviewed and case discussed with ED provider. Management plans discussed with the patient, family and they are in agreement.  CODE STATUS: DO NOT RESUSCITATE based on last hospitalization discussion with patient's guardian. Palliative care was involved during last hospitalization, please see Dr. Hale Bogus note from 12/16/2014  TOTAL TIME TAKING CARE OF THIS PATIENT: 50 minutes.    Paige James M.D on 12/21/2014 at 4:16 PM  Between 7am to 6pm - Pager - [phone]  After 6pm go to [email or web address] - password [email or web address] [facility]  [facility]  Office  [phone]  CC: Primary care physician; No primary care provider on file.

## 2014-12-21 NOTE — ED Notes (Addendum)
Social work called, awaiting them to come see pt in ER, pt given apple juice to drink

## 2014-12-21 NOTE — ED Notes (Signed)
Pt cleaned and bed changed, pt had BM, attempted to sit pt on side of bed and pt was screaming and making no effort, pt assisted back to bed

## 2014-12-21 NOTE — ED Notes (Signed)
Social work to consult with [place] guardian, awaiting placement information

## 2014-12-21 NOTE — Discharge Instructions (Signed)
Please drink plenty of fluid to stay well hydrated and help with your urinary tract infection as well as constipation. Please take the entire course of antibiotics even if you're feeling better. You may take a stool softener to help with constipation.  For your leg pain, you may take Tylenol.  Please return to the emergency department if he develops severe pain, fever, changes in mental status, inability to keep down fluids, or any other symptoms concerning to you.

## 2014-12-21 NOTE — ED Notes (Signed)
Pt given ensure, pt assissted to use bedpan

## 2014-12-21 NOTE — ED Provider Notes (Signed)
[facility] Emergency Department Provider Note  ____________________________________________  Time seen: Approximately 1:42 PM  I have reviewed the triage vital signs and the nursing notes.   HISTORY  Chief Complaint Leg Pain  patient history and physical are limited due to her dementia.   HPI Paige James is a 81 y.o. female with a history of senile dementia, osteoarthritis, but from her group home/assisted living for unwillingness to walk, left leg pain.The patient is unable to give any further history and her only complaint is a desire to have a bowel movement. The patient denies any recent fever, chills, nausea or vomiting, trauma or falls. She denies any numbness or tingling in the lower extremity.   Past Medical History  Diagnosis Date  . Renal disorder   . Diabetes mellitus without complication ([place])   . Thyroid disease   . Senile dementia   . Hypertension   . Atopic dermatitis   . Osteoarthritis   . Depressive disorder     Patient Active Problem List   Diagnosis Date Noted  . Encephalopathy, metabolic 50/27/7412  . Acute delirium 12/17/2014  . Acute respiratory failure with hypoxia ([place]) 12/17/2014  . COPD exacerbation ([place]) 12/17/2014  . Acute bronchitis 12/17/2014  . Pleural effusion 12/17/2014  . Chronic kidney disease (CKD), stage IV (severe) ([place])   . Acute pulmonary edema (H[facility])   . Acute on chronic combined systolic and diastolic CHF (congestive heart failure) ([place])   . Hypoxia   . Persistent atrial fibrillation ([place])   . SOB (shortness of breath)   . Wheezing   . Atrial fibrillation with RVR ([place])   . CHF (congestive heart failure) ([place]) 12/08/2014    History reviewed. No pertinent past surgical history.  Current Outpatient Rx  Name  Route  Sig  Dispense  Refill  . amLODipine (NORVASC) 5 MG tablet   Oral   Take 5 mg by mouth daily.         . carvedilol (COREG) 3.125 MG tablet   Oral   Take 3.125 mg by mouth 2  (two) times daily with a meal.         . clobetasol cream (TEMOVATE) 0.05 %   Topical   Apply 1 application topically 2 (two) times daily as needed (for bilateral lower legs and feet).         Marland Kitchen FLUoxetine (PROZAC) 10 MG capsule   Oral   Take 10 mg by mouth daily.         Marland Kitchen glipiZIDE (GLUCOTROL) 5 MG tablet   Oral   Take 5 mg by mouth 2 (two) times daily with a meal.         . levocetirizine (XYZAL) 5 MG tablet   Oral   Take 5 mg by mouth daily.         Marland Kitchen levothyroxine (SYNTHROID, LEVOTHROID) 100 MCG tablet   Oral   Take 100 mcg by mouth daily.         . Multiple Vitamin (THEREMS PO)   Oral   Take 1 tablet by mouth daily.         . Olopatadine HCl (PAZEO) 0.7 % SOLN   Both Eyes   Place 1 drop into both eyes daily.          Marland Kitchen oxybutynin (DITROPAN-XL) 10 MG 24 hr tablet   Oral   Take 10 mg by mouth daily.         . pantoprazole (PROTONIX) 40 MG tablet  Oral   Take 40 mg by mouth daily.         . sodium bicarbonate 650 MG tablet   Oral   Take 650 mg by mouth 2 (two) times daily.         . Vitamin D, Ergocalciferol, (DRISDOL) 50000 UNITS CAPS capsule   Oral   Take 50,000 Units by mouth every 7 (seven) days. Pt takes on Wednesday.         Marland Kitchen albuterol (PROVENTIL) (2.5 MG/3ML) 0.083% nebulizer solution   Nebulization   Take 3 mLs (2.5 mg total) by nebulization every 6 (six) hours as needed for wheezing or shortness of breath. Patient not taking: Reported on 12/21/2014   75 mL   12   . diltiazem (CARDIZEM CD) 120 MG 24 hr capsule   Oral   Take 1 capsule (120 mg total) by mouth daily. Patient not taking: Reported on 12/21/2014   30 capsule   6   . hydrALAZINE (APRESOLINE) 25 MG tablet   Oral   Take 1 tablet (25 mg total) by mouth every 6 (six) hours. Patient not taking: Reported on 12/21/2014   120 tablet   6   . isosorbide mononitrate (IMDUR) 30 MG 24 hr tablet   Oral   Take 1 tablet (30 mg total) by mouth daily. Patient not  taking: Reported on 12/21/2014   30 tablet   6   . levothyroxine (SYNTHROID, LEVOTHROID) 112 MCG tablet   Oral   Take 1 tablet (112 mcg total) by mouth daily before breakfast. Patient not taking: Reported on 12/21/2014   30 tablet   6   . methylPREDNISolone (MEDROL DOSEPAK) 4 MG TBPK tablet      follow package directions Patient not taking: Reported on 12/21/2014   21 tablet   0     Allergies Review of patient's allergies indicates no known allergies.  History reviewed. No pertinent family history.  Social History Social History  Substance Use Topics  . Smoking status: Never Smoker   . Smokeless tobacco: None  . Alcohol Use: No    Review of Systems Constitutional: No fever/chills. No falls. No diaphoresis. Eyes: No visual changes. ENT: No sore throat. Cardiovascular: Denies chest pain, palpitations. Respiratory: Denies shortness of breath.  No cough. Gastrointestinal: No abdominal pain.  No nausea, no vomiting.  No diarrhea.  No constipation. Positive for desire to have a bowel movement. Genitourinary: Negative for dysuria. Musculoskeletal: Negative for back pain. Nausea for left leg pain. Skin: Negative for rash. Neurological: Negative for headaches, focal weakness or numbness.  10-point ROS otherwise negative.  ____________________________________________   PHYSICAL EXAM:  VITAL SIGNS: ED Triage Vitals  Enc Vitals Group     BP 12/21/14 1057 189/112 mmHg     Pulse Rate 12/21/14 1057 81     Resp 12/21/14 1057 17     Temp 12/21/14 1057 98.4 F (36.9 C)     Temp Source 12/21/14 1057 Oral     SpO2 12/21/14 1045 97 %     Weight 12/21/14 1057 201 lb (91.173 kg)     Height 12/21/14 1057 '5\' 7"'$  (1.702 m)     Head Cir --      Peak Flow --      Pain Score 12/21/14 1058 10     Pain Loc --      Pain Edu? --      Excl. in [place]? --     Constitutional: Alert and oriented. Well appearing and in no acute  distress. Answers questions appropriately. Eyes:  Conjunctivae are normal.  EOMI. Head: Atraumatic. Nose: No congestion/rhinnorhea. Mouth/Throat: Mucous membranes are moist.  Neck: No stridor.  Supple.   Cardiovascular: Normal rate, regular rhythm. No murmurs, rubs or gallops.  Respiratory: Normal respiratory effort.  No retractions. Lungs CTAB.  No wheezes, rales or ronchi. Gastrointestinal: Soft and nontender. No distention. No peritoneal signs. Musculoskeletal: Patient has full range of motion of bilateral ankles, knees, hips without pain. She has normal sensation to light touch in the lower extremity. Normal DP and PT pulses bilaterally. Neurologic:  Normal speech and language. No gross focal neurologic deficits are appreciated.  Skin:  Skin is warm, dry and intact. No rash noted. Psychiatric: Mood and affect are normal.  ____________________________________________   LABS (all labs ordered are listed, but only abnormal results are displayed)  Labs Reviewed  URINALYSIS COMPLETEWITH MICROSCOPIC ([facility] ONLY) - Abnormal; Notable for the following:    Color, Urine YELLOW (*)    APPearance HAZY (*)    Protein, ur 100 (*)    Leukocytes, UA 2+ (*)    All other components within normal limits   ____________________________________________  EKG  Not indicated ____________________________________________  RADIOLOGY  Dg Chest 1 View  12/21/2014  CLINICAL DATA:  Hip pain.  Renal disorder. EXAM: CHEST 1 VIEW COMPARISON:  12/17/2014. FINDINGS: Improved inspiration. Progressive enlargement of the cardiac silhouette. Stable prominence of the pulmonary vasculature and interstitial markings. No pleural fluid. Mild scoliosis. Diffuse osteopenia. IMPRESSION: 1. Progressive cardiomegaly. 2. Improved inspiration with grossly stable mild pulmonary vascular congestion and chronic interstitial lung disease. Electronically Signed   By: Claudie Revering M.D.   On: 12/21/2014 12:43   Dg Hip Unilat With Pelvis 2-3 Views Left  12/21/2014  CLINICAL DATA:   Pain and inability to stand EXAM: DG HIP (WITH OR WITHOUT PELVIS) 2-3V LEFT COMPARISON:  None. FINDINGS: Frontal pelvis as well as frontal and lateral left hip images were obtained. Bones are osteoporotic. There is no demonstrable fracture or dislocation. There is mild symmetric narrowing of both hip joints. No erosive change. There are scattered foci of arterial vascular calcification. There is evidence of an aneurysm of the distal common iliac artery on the right measuring 3.3 cm. IMPRESSION: Distal right common iliac artery aneurysm. No fracture or dislocation. Bones osteoporotic. Narrowing both hip joints, symmetric. Electronically Signed   By: Lowella Grip III M.D.   On: 12/21/2014 12:42    ____________________________________________   PROCEDURES  Procedure(s) performed: None  Critical Care performed: No ____________________________________________   INITIAL IMPRESSION / ASSESSMENT AND PLAN / ED COURSE  Pertinent labs & imaging results that were available during my care of the patient were reviewed by me and considered in my medical decision making (see chart for details).  81 y.o. female with senile dementia presenting with left leg pain. On my exam she has full range of motion without pain. I do not see any vascular abnormality, and aortic dissection is very unlikely. She may be having some referred pain if she has need for large bowel movement so I will have the nurse assist her with that. We will also check for urinary tract infection because she smells like urine.  ----------------------------------------- 1:48 PM on 12/21/2014 -----------------------------------------  The patient has no bacteria in your urine but she does have some white blood cells and leukocyte esterase. Given her odor I will initiate her on antibiotics. Her x-ray does not show any fracture in her left hip or in her pelvis. She has had  a large bowel movement and reportedly feels much better after  this.  Her disposition has been complicated by multiple factors including the fact that she is in an intermittent group home when she was in assisted living prior to hospitalization 2 weeks ago. In addition, her daughter is here but it is the state that has guardianship of her. I believe that the patient is safe for discharge, however she is not very mobile and is not safe to go home or to a location that does not have full-time nursing care. I have put in a consult for social work and we will try to find placement for her.  ----------------------------------------- 3:10 PM on 12/21/2014 -----------------------------------------  The patient continues to remain stable and does not have any pain at this time. I'm still awaiting social work consultation to be complete for placement.  ____________________________________________  FINAL CLINICAL IMPRESSION(S) / ED DIAGNOSES  Final diagnoses:  Left leg pain  UTI (lower urinary tract infection)  Constipation, unspecified constipation type      NEW MEDICATIONS STARTED DURING THIS VISIT:  New Prescriptions   No medications on file     Eula Listen, MD 12/21/14 1510

## 2014-12-21 NOTE — Care Management Note (Signed)
Case Management Note  Patient Details  Name: Paige James MRN: [id number] Date of Birth: 1921-01-12  Subjective/Objective:   Spoke to [place]  To let her know pt. Status. SHe has worked with [name] before and will work things out with her. I have explained to her the patient does not meet admission criteria. The knee pain c/o is chronic but has flared up before. i also advised her there was a consult to [facility] for the pt. From here PCP placed. The  RN for the pt. , Minette Brine is also aaware of the conversation with CSW.                 Action/Plan:   Expected Discharge Date:                  Expected Discharge Plan:     In-House Referral:     Discharge planning Services     Post Acute Care Choice:    Choice offered to:     DME Arranged:    DME Agency:     HH Arranged:    [place] Agency:     Status of Service:     Medicare Important Message Given:    Date Medicare IM Given:    Medicare IM give by:    Date Additional Medicare IM Given:    Additional Medicare Important Message give by:     If discussed at [place] of Stay Meetings, dates discussed:    Additional Comments:  Beau Fanny, RN 12/21/2014, 2:34 PM

## 2014-12-21 NOTE — Care Management Note (Signed)
Case Management Note  Patient Details  Name: Paige James MRN: [id number] Date of Birth: [birth year]  Subjective/Objective:  Call from [name] [phone], to confirm the family care home cannot meet the patients needs at this time due to her increased pain, which renders her unable to walk. She attests this is a complete change of status for the patient whom she has been working with for some time. The family care home told [name] they could take her back in 2 weeks or so if she is able to walk at that time. I have explained to [name] we have CSW consulted and the patient may need placement at a higher level. SHe is out of the office for the rest of the afternoon, but is in agreement with alternate care if available.                  Action/Plan:   Expected Discharge Date:                  Expected Discharge Plan:     In-House Referral:     Discharge planning Services     Post Acute Care Choice:    Choice offered to:     DME Arranged:    DME Agency:     HH Arranged:    [place] Agency:     Status of Service:     Medicare Important Message Given:    Date Medicare IM Given:    Medicare IM give by:    Date Additional Medicare IM Given:    Additional Medicare Important Message give by:     If discussed at [place] of Stay Meetings, dates discussed:    Additional Comments:  Beau Fanny, RN 12/21/2014, 2:24 PM

## 2014-12-21 NOTE — ED Notes (Signed)
Pt eating applesauce, refused something more to drink

## 2014-12-21 NOTE — Patient Outreach (Signed)
[facility]) Care Management  12/21/2014  DEYCI GESELL 1920/09/23 [id number]   Notification to close case due to patient was discharged with Hospice services.  Thanks, Ronnell Freshwater. [place], [place] Assistant Phone: [phone] Fax: [phone]

## 2014-12-21 NOTE — ED Notes (Signed)
Pt via ems from assisted living. Pt c/o pain  In her left leg and not being able to finish her BM. Pt placed on bedpan but states she can't use it and needs a bigger one. Pt also reports that she is unable to stand or walk.

## 2014-12-22 DIAGNOSIS — I4891 Unspecified atrial fibrillation: Secondary | ICD-10-CM | POA: Diagnosis not present

## 2014-12-22 DIAGNOSIS — E0865 Diabetes mellitus due to underlying condition with hyperglycemia: Secondary | ICD-10-CM | POA: Diagnosis not present

## 2014-12-22 DIAGNOSIS — Z8042 Family history of malignant neoplasm of prostate: Secondary | ICD-10-CM | POA: Diagnosis not present

## 2014-12-22 DIAGNOSIS — M1990 Unspecified osteoarthritis, unspecified site: Secondary | ICD-10-CM | POA: Diagnosis not present

## 2014-12-22 DIAGNOSIS — M6281 Muscle weakness (generalized): Secondary | ICD-10-CM | POA: Diagnosis not present

## 2014-12-22 DIAGNOSIS — I10 Essential (primary) hypertension: Secondary | ICD-10-CM | POA: Diagnosis not present

## 2014-12-22 DIAGNOSIS — N184 Chronic kidney disease, stage 4 (severe): Secondary | ICD-10-CM | POA: Diagnosis not present

## 2014-12-22 DIAGNOSIS — M1712 Unilateral primary osteoarthritis, left knee: Secondary | ICD-10-CM | POA: Diagnosis not present

## 2014-12-22 DIAGNOSIS — Z7984 Long term (current) use of oral hypoglycemic drugs: Secondary | ICD-10-CM | POA: Diagnosis not present

## 2014-12-22 DIAGNOSIS — J441 Chronic obstructive pulmonary disease with (acute) exacerbation: Secondary | ICD-10-CM | POA: Diagnosis not present

## 2014-12-22 DIAGNOSIS — Z9011 Acquired absence of right breast and nipple: Secondary | ICD-10-CM | POA: Diagnosis not present

## 2014-12-22 DIAGNOSIS — E1122 Type 2 diabetes mellitus with diabetic chronic kidney disease: Secondary | ICD-10-CM | POA: Diagnosis not present

## 2014-12-22 DIAGNOSIS — J849 Interstitial pulmonary disease, unspecified: Secondary | ICD-10-CM | POA: Diagnosis not present

## 2014-12-22 DIAGNOSIS — M8580 Other specified disorders of bone density and structure, unspecified site: Secondary | ICD-10-CM | POA: Diagnosis not present

## 2014-12-22 DIAGNOSIS — N179 Acute kidney failure, unspecified: Secondary | ICD-10-CM | POA: Diagnosis not present

## 2014-12-22 DIAGNOSIS — I723 Aneurysm of iliac artery: Secondary | ICD-10-CM | POA: Diagnosis not present

## 2014-12-22 DIAGNOSIS — M109 Gout, unspecified: Secondary | ICD-10-CM | POA: Diagnosis not present

## 2014-12-22 DIAGNOSIS — Z800 Family history of malignant neoplasm of digestive organs: Secondary | ICD-10-CM | POA: Diagnosis not present

## 2014-12-22 DIAGNOSIS — I5020 Unspecified systolic (congestive) heart failure: Secondary | ICD-10-CM | POA: Diagnosis not present

## 2014-12-22 DIAGNOSIS — Z806 Family history of leukemia: Secondary | ICD-10-CM | POA: Diagnosis not present

## 2014-12-22 DIAGNOSIS — N3281 Overactive bladder: Secondary | ICD-10-CM | POA: Diagnosis not present

## 2014-12-22 DIAGNOSIS — Z9889 Other specified postprocedural states: Secondary | ICD-10-CM | POA: Diagnosis not present

## 2014-12-22 DIAGNOSIS — E119 Type 2 diabetes mellitus without complications: Secondary | ICD-10-CM | POA: Diagnosis not present

## 2014-12-22 DIAGNOSIS — I130 Hypertensive heart and chronic kidney disease with heart failure and stage 1 through stage 4 chronic kidney disease, or unspecified chronic kidney disease: Secondary | ICD-10-CM | POA: Diagnosis not present

## 2014-12-22 DIAGNOSIS — Z853 Personal history of malignant neoplasm of breast: Secondary | ICD-10-CM | POA: Diagnosis not present

## 2014-12-22 DIAGNOSIS — E039 Hypothyroidism, unspecified: Secondary | ICD-10-CM | POA: Diagnosis not present

## 2014-12-22 DIAGNOSIS — M25562 Pain in left knee: Secondary | ICD-10-CM | POA: Diagnosis not present

## 2014-12-22 DIAGNOSIS — I890 Lymphedema, not elsewhere classified: Secondary | ICD-10-CM | POA: Diagnosis not present

## 2014-12-22 DIAGNOSIS — I480 Paroxysmal atrial fibrillation: Secondary | ICD-10-CM | POA: Diagnosis not present

## 2014-12-22 DIAGNOSIS — F0390 Unspecified dementia without behavioral disturbance: Secondary | ICD-10-CM | POA: Diagnosis not present

## 2014-12-22 DIAGNOSIS — Z79899 Other long term (current) drug therapy: Secondary | ICD-10-CM | POA: Diagnosis not present

## 2014-12-22 DIAGNOSIS — R0989 Other specified symptoms and signs involving the circulatory and respiratory systems: Secondary | ICD-10-CM | POA: Diagnosis not present

## 2014-12-22 DIAGNOSIS — N390 Urinary tract infection, site not specified: Secondary | ICD-10-CM | POA: Diagnosis not present

## 2014-12-22 DIAGNOSIS — E038 Other specified hypothyroidism: Secondary | ICD-10-CM | POA: Diagnosis not present

## 2014-12-22 DIAGNOSIS — R0602 Shortness of breath: Secondary | ICD-10-CM | POA: Diagnosis not present

## 2014-12-22 LAB — CBC
HEMATOCRIT: 27.7 % — AB (ref 35.0–47.0)
HEMOGLOBIN: 9.4 g/dL — AB (ref 12.0–16.0)
MCH: 33.8 pg (ref 26.0–34.0)
MCHC: 33.8 g/dL (ref 32.0–36.0)
MCV: 100 fL (ref 80.0–100.0)
Platelets: 153 10*3/uL (ref 150–440)
RBC: 2.77 MIL/uL — AB (ref 3.80–5.20)
RDW: 16.1 % — ABNORMAL HIGH (ref 11.5–14.5)
WBC: 5.9 10*3/uL (ref 3.6–11.0)

## 2014-12-22 LAB — BASIC METABOLIC PANEL
ANION GAP: 6 (ref 5–15)
BUN: 69 mg/dL — ABNORMAL HIGH (ref 6–20)
CALCIUM: 8 mg/dL — AB (ref 8.9–10.3)
CO2: 27 mmol/L (ref 22–32)
Chloride: 106 mmol/L (ref 101–111)
Creatinine, Ser: 2.38 mg/dL — ABNORMAL HIGH (ref 0.44–1.00)
GFR, EST AFRICAN AMERICAN: 19 mL/min — AB (ref >60)
GFR, EST NON AFRICAN AMERICAN: 17 mL/min — AB (ref >60)
GLUCOSE: 155 mg/dL — AB (ref 65–99)
POTASSIUM: 4.2 mmol/L (ref 3.5–5.1)
Sodium: 139 mmol/L (ref 135–145)

## 2014-12-22 LAB — GLUCOSE, CAPILLARY
GLUCOSE-CAPILLARY: 131 mg/dL — AB (ref 65–99)
Glucose-Capillary: 179 mg/dL — ABNORMAL HIGH (ref 65–99)

## 2014-12-22 MED ORDER — CEFUROXIME AXETIL 250 MG PO TABS: 250.0000 mg | ORAL_TABLET | Freq: Two times a day (BID) | ORAL | Status: DC

## 2014-12-22 MED ORDER — OXYCODONE HCL 5 MG PO TABS: 5.0000 mg | ORAL_TABLET | Freq: Four times a day (QID) | ORAL | Status: DC | PRN

## 2014-12-22 MED ORDER — DEXTROSE 5 % IV SOLN
1.0000 g | Freq: Every day | INTRAVENOUS | Status: DC
  Filled 2014-12-22: qty 10

## 2014-12-22 NOTE — Discharge Summary (Signed)
[facility] [place] NAME: Paige James    MR#:  [id number]  DATE OF BIRTH:  [birth year]  DATE OF ADMISSION:  12/21/2014 ADMITTING PHYSICIAN: Gladstone Lighter, MD  DATE OF DISCHARGE: 12/22/2014  PRIMARY CARE PHYSICIAN: No primary care provider on file.    ADMISSION DIAGNOSIS:  Pain [R52] UTI (lower urinary tract infection) [N39.0] Left leg pain [M79.605] Constipation, unspecified constipation type [K59.00]  DISCHARGE DIAGNOSIS:  Active Problems:   Knee pain   SECONDARY DIAGNOSIS:   Past Medical History  Diagnosis Date  . Renal disorder     CKD  . Diabetes mellitus without complication ([place])   . Thyroid disease     Hypothyroidism  . Senile dementia   . Hypertension   . Atopic dermatitis   . Osteoarthritis   . Depressive disorder   . Atrial fibrillation ([place])   . Breast cancer ([place])   . Congestive heart failure (CHF) (HCC)     Ejection fraction of 45-50%    HOSPITAL COURSE:   Paige James is a 81 y.o. female with a known history of systolic CHF, CK 50, dementia, arthritis and atrial fibrillation who was recently in the hospital until last week was discharged to assisted living facility brought back secondary to left knee pain and inability to walk.  #1 left knee pain-significant pain and swelling of her left knee. -Could be gout or pseudogout. Has significant osteoarthritis. Also status post surgery on that knee in the past. -Follow up x-rays, no fracture- she  Feels very less pain today. -Pain medications when necessary for now. Uric acid level ordered. -Physical therapy consult.- suggested SNF placement.  #2 hypertension-elevated currently. -Continue on her oral home medications. Patient on Coreg, Norvasc, Cardizem, hydralazine and Imdur.  #3 atrial fibrillation-new diagnosis during last hospitalization. Seems to be in sinus rhythm now. -Continue Cardizem and Coreg for rate control. -Not on any  anticoagulation. Likely due to her age and also risk of falls. -Last echo with EF of 45-50%. Well compensated at this time. Not in any diuretics. -Also had thoracentesis done and her last admission. Currently not needing any oxygen  #4 diabetes mellitus-last A1c is 6.0. -Continue her oral medications. Also put on sliding scale insulin.  #5 dementia-patient seems to be at baseline. Oriented to self. Pleasant conversation at times. -Significant delirium, sundowning during last hospitalization. She'll continue to monitor.  #6 ARF on CKD- with elevated BUN/cr - no evidence of fluid overload, gentle hydration for 1 day with her poor intake - known CKD stage4 - creatinin is 2.38 on d/c.  #7 DVT prophylaxis-subcutaneous heparin  #8 UTI   UA positive, pt have no symptoms.   IV rocephin in hospital, cefuroxime for 6 days on d/c.   DISCHARGE CONDITIONS:   Stable.  CONSULTS OBTAINED:     DRUG ALLERGIES:  No Known Allergies  DISCHARGE MEDICATIONS:   Current Discharge Medication List    START taking these medications   Details  cefUROXime (CEFTIN) 250 MG tablet Take 1 tablet (250 mg total) by mouth 2 (two) times daily with a meal. Qty: 12 tablet, Refills: 0    oxyCODONE (OXY IR/ROXICODONE) 5 MG immediate release tablet Take 1 tablet (5 mg total) by mouth every 6 (six) hours as needed for moderate pain. Qty: 20 tablet, Refills: 0      CONTINUE these medications which have NOT CHANGED   Details  amLODipine (NORVASC) 5 MG tablet Take 5 mg by mouth daily.  carvedilol (COREG) 3.125 MG tablet Take 3.125 mg by mouth 2 (two) times daily with a meal.    clobetasol cream (TEMOVATE) 7.61 % Apply 1 application topically 2 (two) times daily as needed (for bilateral lower legs and feet).    FLUoxetine (PROZAC) 10 MG capsule Take 10 mg by mouth daily.    glipiZIDE (GLUCOTROL) 5 MG tablet Take 5 mg by mouth 2 (two) times daily with a meal.    levocetirizine (XYZAL) 5 MG tablet Take 5 mg  by mouth daily.    Multiple Vitamin (THEREMS PO) Take 1 tablet by mouth daily.    Olopatadine HCl (PAZEO) 0.7 % SOLN Place 1 drop into both eyes daily.     oxybutynin (DITROPAN-XL) 10 MG 24 hr tablet Take 10 mg by mouth daily.    pantoprazole (PROTONIX) 40 MG tablet Take 40 mg by mouth daily.    sodium bicarbonate 650 MG tablet Take 650 mg by mouth 2 (two) times daily.    Vitamin D, Ergocalciferol, (DRISDOL) 50000 UNITS CAPS capsule Take 50,000 Units by mouth every 7 (seven) days. Pt takes on Wednesday.    albuterol (PROVENTIL) (2.5 MG/3ML) 0.083% nebulizer solution Take 3 mLs (2.5 mg total) by nebulization every 6 (six) hours as needed for wheezing or shortness of breath. Qty: 75 mL, Refills: 12    diltiazem (CARDIZEM CD) 120 MG 24 hr capsule Take 1 capsule (120 mg total) by mouth daily. Qty: 30 capsule, Refills: 6    hydrALAZINE (APRESOLINE) 25 MG tablet Take 1 tablet (25 mg total) by mouth every 6 (six) hours. Qty: 120 tablet, Refills: 6    isosorbide mononitrate (IMDUR) 30 MG 24 hr tablet Take 1 tablet (30 mg total) by mouth daily. Qty: 30 tablet, Refills: 6    levothyroxine (SYNTHROID, LEVOTHROID) 112 MCG tablet Take 1 tablet (112 mcg total) by mouth daily before breakfast. Qty: 30 tablet, Refills: 6      STOP taking these medications     methylPREDNISolone (MEDROL DOSEPAK) 4 MG TBPK tablet          DISCHARGE INSTRUCTIONS:    Follow with PMD in 1 week.  If you experience worsening of your admission symptoms, develop shortness of breath, life threatening emergency, suicidal or homicidal thoughts you must seek medical attention immediately by calling 911 or calling your MD immediately  if symptoms less severe.  You Must read complete instructions/literature along with all the possible adverse reactions/side effects for all the Medicines you take and that have been prescribed to you. Take any new Medicines after you have completely understood and accept all the possible  adverse reactions/side effects.   Please note  You were cared for by a hospitalist during your hospital stay. If you have any questions about your discharge medications or the care you received while you were in the hospital after you are discharged, you can call the unit and asked to speak with the hospitalist on call if the hospitalist that took care of you is not available. Once you are discharged, your primary care physician will handle any further medical issues. Please note that NO REFILLS for any discharge medications will be authorized once you are discharged, as it is imperative that you return to your primary care physician (or establish a relationship with a primary care physician if you do not have one) for your aftercare needs so that they can reassess your need for medications and monitor your lab values.    Today   CHIEF COMPLAINT:  Chief Complaint  Patient presents with  . Leg Pain    HISTORY OF PRESENT ILLNESS:  Paige James  is a 81 y.o. female with a known history of systolic CHF, CK 50, dementia, arthritis and atrial fibrillation who was recently in the hospital until last week was discharged to assisted living facility brought back secondary to left knee pain and inability to walk. Patient due to her history of dementia and is unable to provide any history. The rest of her history is obtained from her daughter at bedside and also from previous medical records. Patient was just discharged from hospital on 12/18/2014 after prolonged stay for atrial fibrillation,, COPD and CHF exacerbation. She was discharged to an assisted living facility, was ambulating with a walker at the time of discharge. She was fine on the day of discharge in the next day she complained of some left knee pain. Not sure why they waited so long, today she couldn't even get herself up to even move and so was brought to the emergency room. No fevers or date. Left knee is tender slightly swollen than  baseline. X-rays are pending at this time.   VITAL SIGNS:  Blood pressure 137/78, pulse 82, temperature 98.4 F (36.9 C), temperature source Oral, resp. rate 18, height '5\' 7"'$  (1.702 m), weight 91.173 kg (201 lb), SpO2 93 %.  I/O:   Intake/Output Summary (Last 24 hours) at 12/22/14 1105 Last data filed at 12/22/14 0733  Gross per 24 hour  Intake      0 ml  Output      0 ml  Net      0 ml    PHYSICAL EXAMINATION:   GENERAL: 81 y.o.-year-old patient lying in the bed with some acute distress due to left knee pain.  EYES: Pupils equal, round, reactive to light and accommodation. No scleral icterus. Extraocular muscles intact.  HEENT: Head atraumatic, normocephalic. Oropharynx and nasopharynx clear.  NECK: Supple, no jugular venous distention. No thyroid enlargement, no tenderness.  LUNGS: Normal breath sounds bilaterally, no wheezing, rales,rhonchi or crepitation. No use of accessory muscles of respiration. Decreased bibasilar breath sounds CARDIOVASCULAR: S1, S2 normal. No rubs, or gallops. 3/6 systolic murmur present ABDOMEN: Soft, nontender, nondistended. Bowel sounds present. No organomegaly or mass.  EXTREMITIES: Right knee normal to exam, changes of osteoarthritis present. Left knee slightly warm to touch more swelling than normal and patient is not going to do an exam due to significant tenderness of the knee. Generalized tenderness at this point. No pedal edema, cyanosis, or clubbing.  NEUROLOGIC: Cranial nerves II through XII are intact. Muscle strength 5/5 in all extremities except left leg is not tested due to pain. Sensation intact. Gait not checked.  PSYCHIATRIC: The patient is alert and oriented to self.  SKIN: No obvious rash, lesion, or ulcer.   DATA REVIEW:   CBC  Recent Labs Lab 12/22/14 0538  WBC 5.9  HGB 9.4*  HCT 27.7*  PLT 153    Chemistries   Recent Labs Lab 12/22/14 0538  NA 139  K 4.2  CL 106  CO2 27  GLUCOSE 155*  BUN 69*   CREATININE 2.38*  CALCIUM 8.0*    Cardiac Enzymes No results for input(s): TROPONINI in the last 168 hours.  Microbiology Results  No results found for this or any previous visit.  RADIOLOGY:  Dg Chest 1 View  12/21/2014  CLINICAL DATA:  Hip pain.  Renal disorder. EXAM: CHEST 1 VIEW COMPARISON:  12/17/2014. FINDINGS: Improved inspiration. Progressive  enlargement of the cardiac silhouette. Stable prominence of the pulmonary vasculature and interstitial markings. No pleural fluid. Mild scoliosis. Diffuse osteopenia. IMPRESSION: 1. Progressive cardiomegaly. 2. Improved inspiration with grossly stable mild pulmonary vascular congestion and chronic interstitial lung disease. Electronically Signed   By: Claudie Revering M.D.   On: 12/21/2014 12:43   Dg Knee 1-2 Views Left  12/21/2014  CLINICAL DATA:  Left leg pain.  No known injury. EXAM: LEFT KNEE - 1-2 VIEW COMPARISON:  11/30/2006. FINDINGS: Again demonstrated is a left total knee prosthesis. No effusion is seen. There is mild diffuse soft tissue swelling, most pronounced laterally. No fracture, dislocation or evidence of prosthetic loosening. Atheromatous arterial calcifications. IMPRESSION: Soft tissue swelling without acute underlying bony abnormality. Electronically Signed   By: Claudie Revering M.D.   On: 12/21/2014 16:30   Dg Hip Unilat With Pelvis 2-3 Views Left  12/21/2014  CLINICAL DATA:  Pain and inability to stand EXAM: DG HIP (WITH OR WITHOUT PELVIS) 2-3V LEFT COMPARISON:  None. FINDINGS: Frontal pelvis as well as frontal and lateral left hip images were obtained. Bones are osteoporotic. There is no demonstrable fracture or dislocation. There is mild symmetric narrowing of both hip joints. No erosive change. There are scattered foci of arterial vascular calcification. There is evidence of an aneurysm of the distal common iliac artery on the right measuring 3.3 cm. IMPRESSION: Distal right common iliac artery aneurysm. No fracture or  dislocation. Bones osteoporotic. Narrowing both hip joints, symmetric. Electronically Signed   By: Lowella Grip III M.D.   On: 12/21/2014 12:42    EKG:   Orders placed or performed during the hospital encounter of 12/08/14  . EKG 12-Lead  . EKG 12-Lead  . ED EKG  . ED EKG  . EKG      Management plans discussed with the patient, family and they are in agreement.  CODE STATUS:     Code Status Orders        Start     Ordered   12/21/14 9937  Do not attempt resuscitation (DNR)   Continuous    Question Answer Comment  In the event of cardiac or respiratory ARREST Do not call a "code blue"   In the event of cardiac or respiratory ARREST Do not perform Intubation, CPR, defibrillation or ACLS   In the event of cardiac or respiratory ARREST Use medication by any route, position, wound care, and other measures to relive pain and suffering. May use oxygen, suction and manual treatment of airway obstruction as needed for comfort.      12/21/14 1716    Advance Directive Documentation        Most Recent Value   Type of Advance Directive  Healthcare Power of Attorney   Pre-existing out of facility DNR order (yellow form or pink MOST form)     "MOST" Form in Place?       Called her daughter on phone and informed about discharge. TOTAL TIME TAKING CARE OF THIS PATIENT: 35 minutes.    Vaughan Basta M.D on 12/22/2014 at 11:05 AM  Between 7am to 6pm - Pager - [phone]  After 6pm go to [email or web address] - password [email or web address] [facility]  [facility]  Office  [phone]  CC: Primary care physician; No primary care provider on file.   Note: This dictation was prepared with Dragon dictation along with smaller phrase technology. Any transcriptional errors that result from this process are unintentional.

## 2014-12-22 NOTE — Care Management (Signed)
Patient came to [facility] from [place]. [place], [place] [facility]. Paige James was reviewing case for home health. Patient is not a hospice patient.

## 2014-12-22 NOTE — Progress Notes (Signed)
Pt. Alert and oriented. VSS. No c/o pain. Incontinent of bladder. Pills whole with water. Resting quietly.

## 2014-12-22 NOTE — Clinical Social Work Note (Signed)
Clinical Social Work Assessment  Patient Details  Name: Paige James MRN: [id number] Date of Birth: Sep 16, 1920  Date of referral:  12/21/14               Reason for consult:  Facility Placement                Permission sought to share information with:  [place] granted to share information::  Yes, Verbal Permission Granted  Name::        Agency::  [place] guardian  Demetrius Charity.   Relationship::     Contact Information:     Housing/Transportation Living arrangements for the past 2 months:  [place] of Information:  Medical Team, Guardian, Case Manager Patient Interpreter Needed:  None Criminal Activity/Legal Involvement Pertinent to Current Situation/Hospitalization:  No - Comment as needed Significant Relationships:  Adult Children, Community Support Lives with:  Facility Resident Do you feel safe going back to the place where you live?  No Need for family participation in patient care:  Yes (Comment) (Has DSS guardian )  Care giving concerns: Pt was recently dc back to group home. Pt in pain and group home is unable to assist her at her current mobility level.    Social Worker assessment / plan:  CSW was consulted to assist with placement while Pt was in the ED. Pt lives at group home but is in need of additional assistance due to decrease in mobility. Pt is in pain in the ED, daughter is at bedside though she is not the decision maker. Pt's guardian is DSS, Marshell Garfinkel is listed as guardian but CSW was informed that Rex Kras is not overseeing this guardianship assignment. CSW discussed SNF needs with DSS and they are in agreement to bed search. CSW discussed bed offers with [facility] and [facility] was selected. After placement was arranged from ED, CSW was notified that Pt is going to be admitted to inpt unit for further medical work-up. CSW will follow up with guardian regarding dc plans from inpt unit.   Employment status:   Retired Nurse, adult, Medicaid In [place] PT Recommendations:  Not assessed at this time [place] / Referral to community resources:  [place]  Patient/Family's Response to care:  Pt's daughter at bedside, supportive of patient.   Patient/Family's Understanding of and Emotional Response to Diagnosis, Current Treatment, and Prognosis:  Pt in extreme pain, will not allow MD to touch knee for evaluation.   Emotional Assessment Appearance:  Appears stated age Attitude/Demeanor/Rapport:  Unable to Assess (due to pain in knee) Affect (typically observed):  Anxious Orientation:  Oriented to Self, Oriented to Situation, Oriented to Place Alcohol / Substance use:  Never Used Psych involvement (Current and /or in the community):  No (Comment)  Discharge Needs  Concerns to be addressed:  Adjustment to Illness, Discharge Planning Concerns Readmission within the last 30 days:  Yes Current discharge risk:  Dependent with Mobility, Cognitively Impaired Barriers to Discharge:  Continued Medical Work up   [employer], LCSW 12/22/2014, 9:35 AM

## 2014-12-22 NOTE — Clinical Social Work Placement (Signed)
   CLINICAL SOCIAL WORK PLACEMENT  NOTE  Date:  12/22/2014  Patient Details  Name: Paige James MRN: [id number] Date of Birth: [birth year]  Clinical Social Work is seeking post-discharge placement for this patient at the [place] level of care (*CSW will initial, date and re-position this form in  chart as items are completed):  Yes   Patient/family provided with [place] Work Department's list of facilities offering this level of care within the geographic area requested by the patient (or if unable, by the patient's family).  Yes   Patient/family informed of their freedom to choose among providers that offer the needed level of care, that participate in Medicare, Medicaid or managed care program needed by the patient, have an available bed and are willing to accept the patient.  Yes   Patient/family informed of [facility]'s ownership interest in [facility] and [facility], as well as of the fact that they are under no obligation to receive care at these facilities.  PASRR submitted to EDS on       PASRR number received on       Existing PASRR number confirmed on 12/22/14     FL2 transmitted to all facilities in geographic area requested by pt/family on 12/22/14     FL2 transmitted to all facilities within larger geographic area on       Patient informed that his/her managed care company has contracts with or will negotiate with certain facilities, including the following:            Patient/family informed of bed offers received.  Patient chooses bed at       Physician recommends and patient chooses bed at      Patient to be transferred to   on  .  Patient to be transferred to facility by       Patient family notified on   of transfer.  Name of family member notified:        PHYSICIAN Please sign FL2     Additional Comment:    _______________________________________________ Loralyn Freshwater, LCSW 12/22/2014, 8:18  AM

## 2014-12-22 NOTE — Progress Notes (Signed)
D/C Summary faxed to [place] guardian.   Blima Rich, [place] [phone]

## 2014-12-22 NOTE — Progress Notes (Signed)
Spoke with Ander Purpura, [facility] rep at [phone], to notify of non-emergent EMS transport.  Auth notification reference given as [id number].   Service date range good from 12/22/14 - 03/22/15.   Gap exception requested to determine if services can be considered at an in-network level.

## 2014-12-22 NOTE — Evaluation (Signed)
Physical Therapy Evaluation Patient Details Name: Paige James MRN: [id number] DOB: [birth year] Today's Date: 12/22/2014   History of Present Illness  Pt is a 81 y.o. female presenting to hospital with L knee pain and unable to walk.  Imaging of L pelvis and L knee negative for fx (positive for L knee soft tissue swelling; pelvis imaging showing distal R common iliac artery aneurysm).  Pt with recent hospital stay (discharged from [facility] 12/18/14) with AMS, hypoxia, and new onset a-fib.  PMH includes dementia, metastatic breast CA s/p R mastectomy with chemotherapy and radiation, s/p B TKR.  Clinical Impression  Currently pt demonstrates impairments with strength, pain, balance, and limitations with functional mobility.  Prior to admission, pt reports ambulating without AD (although pt also reports recently unable to walk with RW prior to admission d/t L knee pain).  Pt lives in a family care home.  Currently pt requires 2 assist for transfers and taking a few steps with RW bed to chair.  Pt would benefit from skilled PT to address above noted impairments and functional limitations.  Recommend pt discharge to STR when medically appropriate.     Follow Up Recommendations SNF    Equipment Recommendations  Rolling walker with 5" wheels    Recommendations for Other Services       Precautions / Restrictions Precautions Precautions: Fall Restrictions Weight Bearing Restrictions: No      Mobility  Bed Mobility Overal bed mobility: Needs Assistance Bed Mobility: Supine to Sit     Supine to sit: Mod assist;HOB elevated     General bed mobility comments: assist for trunk and L LE; vc's required for technique and use of side rail; 2 assist to scoot to edge of bed  Transfers Overall transfer level: Needs assistance Equipment used: Rolling walker (2 wheeled) Transfers: Sit to/from Stand Sit to Stand: Mod assist;+2 physical assistance         General transfer comment: x2 trials;  vc's required for B feet placement and technique; pt unable to get to full upright posture  Ambulation/Gait Ambulation/Gait assistance: Min assist;Mod assist;+2 physical assistance Ambulation Distance (Feet): 3 Feet (bed to recliner) Assistive device: Rolling walker (2 wheeled)   Gait velocity: decreased   General Gait Details: antalgic; pt requesting "space" to be able to get to recliner; flexed posture; impulsive; vc's and assist required for safety  Stairs            Wheelchair Mobility    Modified Rankin (Stroke Patients Only)       Balance Overall balance assessment: Needs assistance Sitting-balance support: Bilateral upper extremity supported;Feet supported Sitting balance-Leahy Scale: Good     Standing balance support: Bilateral upper extremity supported (on RW) Standing balance-Leahy Scale: Fair Standing balance comment: trunk flexed                             Pertinent Vitals/Pain Pain Assessment: 0-10 Pain Score: 5  Pain Location: L knee pain Pain Descriptors / Indicators: Sore;Tender Pain Intervention(s): Limited activity within patient's tolerance;Monitored during session;Premedicated before session;Repositioned  Vitals stable and WFL throughout treatment session.    Home Living Family/patient expects to be discharged to:: Other (Comment) (Family care home)                      Prior Function Level of Independence: Independent         Comments: Pt reports ambulating without AD but has RW.  Hand Dominance        Extremity/Trunk Assessment   Upper Extremity Assessment: Generalized weakness           Lower Extremity Assessment: Generalized weakness (L knee flexion and extension 3-/5 d/t pain)         Communication   Communication: No difficulties  Cognition Arousal/Alertness: Awake/alert Behavior During Therapy: Impulsive Overall Cognitive Status: No family/caregiver present to determine baseline cognitive  functioning (Oriented to person, situation, and place; not to date)       Memory: Decreased recall of precautions              General Comments General comments (skin integrity, edema, etc.): L knee swollen and tender to touch (anterior)  Nursing cleared pt for participation in physical therapy.  Pt agreeable to PT session.    Exercises   Performed semi-supine B LE therapeutic exercise x 10 reps:  Ankle pumps (AROM B LE's); quad sets x3 second holds (AROM B LE's); glute squeezes x3 second holds (AROM B); SAQ's (AROM R; AROM L); heelslides (AROM R; AAROM L), hip abd/adduction (AROM R; AAROM L).  Pt required vc's and tactile cues for correct technique with exercises.       Assessment/Plan    PT Assessment Patient needs continued PT services  PT Diagnosis Generalized weakness;Difficulty walking;Acute pain   PT Problem List Decreased strength;Decreased activity tolerance;Decreased balance;Decreased mobility;Decreased knowledge of use of DME;Decreased safety awareness;Decreased knowledge of precautions;Pain  PT Treatment Interventions DME instruction;Gait training;Functional mobility training;Therapeutic activities;Therapeutic exercise;Balance training;Patient/family education;Manual techniques   PT Goals (Current goals can be found in the Care Plan section) Acute Rehab PT Goals Patient Stated Goal: to have less knee pain PT Goal Formulation: With patient Time For Goal Achievement: 01/05/15 Potential to Achieve Goals: Fair    Frequency Min 2X/week   Barriers to discharge Decreased caregiver support      Co-evaluation               End of Session Equipment Utilized During Treatment: Gait belt Activity Tolerance: Patient limited by fatigue;Patient limited by pain Patient left: in chair;with call bell/phone within reach;with chair alarm set;with SCD's reapplied Nurse Communication: Mobility status         Time: 3143-8887 PT Time Calculation (min) (ACUTE ONLY): 23  min   Charges:   PT Evaluation $Initial PT Evaluation Tier I: 1 Procedure PT Treatments $Therapeutic Exercise: 8-22 mins   PT G CodesLeitha Bleak 12-23-2014, 10:27 AM Leitha Bleak, [place]

## 2014-12-22 NOTE — Progress Notes (Signed)
Patient is medically stable for D/C to [facility] today. Per Neoma Laming admissions coordinator at [facility] patient is going to room 303. RN will call report to [facility] and arrange EMS for transport. Clinical Education officer, museum (CSW) prepared D/C packet and sent D/C Summary to [employer] via carefinder. Patient is aware of above. CSW left [place] DSS guardian a voicemail making her aware of above. Please reconsult if future social work needs arise. CSW signing off.   Blima Rich, [place] [phone]

## 2014-12-22 NOTE — Clinical Social Work Placement (Signed)
   CLINICAL SOCIAL WORK PLACEMENT  NOTE  Date:  12/22/2014  Patient Details  Name: Paige James MRN: [id number] Date of Birth: 05/08/1920  Clinical Social Work is seeking post-discharge placement for this patient at the [place] level of care (*CSW will initial, date and re-position this form in  chart as items are completed):  Yes   Patient/family provided with [facility] Work Department's list of facilities offering this level of care within the geographic area requested by the patient (or if unable, by the patient's family).  Yes   Patient/family informed of their freedom to choose among providers that offer the needed level of care, that participate in Medicare, Medicaid or managed care program needed by the patient, have an available bed and are willing to accept the patient.  Yes   Patient/family informed of [facility]'s ownership interest in [facility] and [facility], as well as of the fact that they are under no obligation to receive care at these facilities.  PASRR submitted to EDS on       PASRR number received on       Existing PASRR number confirmed on 12/22/14     FL2 transmitted to all facilities in geographic area requested by pt/family on 12/22/14     FL2 transmitted to all facilities within larger geographic area on       Patient informed that his/her managed care company has contracts with or will negotiate with certain facilities, including the following:        Yes   Patient/family informed of bed offers received.  Patient chooses bed at  [facility] )     Physician recommends and patient chooses bed at      Patient to be transferred to  [facility] ) on 12/22/14.  Patient to be transferred to facility by  [facility] )     Patient family notified on 12/22/14 of transfer.  Name of family member notified:   [place]ex [place] DSS guardian aware of D/C today. )     PHYSICIAN       Additional  Comment:    _______________________________________________ Loralyn Freshwater, LCSW 12/22/2014, 12:11 PM

## 2014-12-22 NOTE — Progress Notes (Signed)
Initial Nutrition Assessment   INTERVENTION:   Meals and Snacks: Cater to patient preferences. Agree with Soft diet order at this time Medical Food Supplement Therapy: will send Sugar Free Mighty shakes on meal trays Coordination of Care: recommend daily weights   NUTRITION DIAGNOSIS:   Inadequate oral intake related to poor appetite as evidenced by per patient/family report (per MST on admission).  GOAL:   Patient will meet greater than or equal to 90% of their needs  MONITOR:    (Energy Intake, digestive system, Glucose Profile, Anthropometrics, Electrolyte and renal Profile)  REASON FOR ASSESSMENT:   Malnutrition Screening Tool    ASSESSMENT:   Pt admitted with left knee pain with h/o dementia, CHF, DM, CKD and afib. Pt recently discharged from [facility] 12/18/2014.RD notes order for discharge possibly today.   Past Medical History  Diagnosis Date  . Renal disorder     CKD  . Diabetes mellitus without complication ([place])   . Thyroid disease     Hypothyroidism  . Senile dementia   . Hypertension   . Atopic dermatitis   . Osteoarthritis   . Depressive disorder   . Atrial fibrillation ([place])   . Breast cancer ([place])   . Congestive heart failure (CHF) (HCC)     Ejection fraction of 45-50%     Diet Order:  DIET SOFT Room service appropriate?: Yes; Fluid consistency:: Thin    Current Nutrition: Pt reports eating eggs and coffee this am. Per Nsg student, pt ate about 45% of breakfast this am.   Food/Nutrition-Related History: Pt reports good appetite PTA. RD notes on last admission last week pt eating 95% of meals on average recorded.   Scheduled Medications:  . amLODipine  5 mg Oral Daily  . carvedilol  3.125 mg Oral BID WC  . cefTRIAXone (ROCEPHIN)  IV  1 g Intravenous Daily  . cetirizine  10 mg Oral Daily  . clobetasol cream  1 application Topical BID  . diltiazem  120 mg Oral Daily  . FLUoxetine  10 mg Oral Daily  . glipiZIDE  5 mg Oral BID WC  . heparin   5,000 Units Subcutaneous 3 times per day  . hydrALAZINE  25 mg Oral 4 times per day  . insulin aspart  0-5 Units Subcutaneous QHS  . insulin aspart  0-9 Units Subcutaneous TID WC  . isosorbide mononitrate  30 mg Oral Daily  . levothyroxine  112 mcg Oral QAC breakfast  . multivitamin with minerals  1 tablet Oral Daily  . olopatadine  1 drop Both Eyes Daily  . oxybutynin  10 mg Oral Daily  . pantoprazole  40 mg Oral QAC breakfast  . sodium bicarbonate  650 mg Oral BID  . [START ON 12/23/2014] Vitamin D (Ergocalciferol)  50,000 Units Oral Q Wed    Continuous Medications:  . sodium chloride 50 mL/hr at 12/21/14 1642     Electrolyte/Renal Profile and Glucose Profile:   Recent Labs Lab 12/16/14 0509 12/22/14 0538  NA 135 139  K 5.1 4.2  CL 104 106  CO2 22 27  BUN 85* 69*  CREATININE 3.11* 2.38*  CALCIUM 8.5* 8.0*  GLUCOSE 290* 155*   Protein Profile: No results for input(s): ALBUMIN in the last 168 hours.  Gastrointestinal Profile: Last BM: 12/21/2014    Weight Change: Per [facility] weight gain in 3 days  Height:   Ht Readings from Last 1 Encounters:  12/21/14 '5\' 7"'$  (1.702 m)    Weight:   Wt Readings from  Last 1 Encounters:  12/21/14 201 lb (91.173 kg)    Wt Readings from Last 10 Encounters:  12/21/14 201 lb (91.173 kg)  12/18/14 191 lb 8 oz (86.864 kg)    BMI:  Body mass index is 31.47 kg/(m^2).   EDUCATION NEEDS:   Education needs no appropriate at this time   [place], [place], LDN Pager [phone]

## 2014-12-22 NOTE — Progress Notes (Signed)
Patient discharging to [facility].  Report called to facility, ems called. Waiting on transportation.

## 2014-12-22 NOTE — Progress Notes (Signed)
Clinical Education officer, museum (CSW) spoke with Paige James patient's DSS guardian this morning. Per Paige James she has completed admissions paper work for [facility] and faxed it to [employer] at [facility]. CSW contacted [facility] admissions coordinator at [facility] who reported that she received the completed admissions paper work from [employer] and patient can come today. Plan is for patient to D/C to [facility] when stable. CSW will continue to follow and assist as needed.   Blima Rich, [place] [phone]

## 2014-12-23 DIAGNOSIS — E119 Type 2 diabetes mellitus without complications: Secondary | ICD-10-CM | POA: Diagnosis not present

## 2014-12-23 DIAGNOSIS — E038 Other specified hypothyroidism: Secondary | ICD-10-CM | POA: Diagnosis not present

## 2014-12-23 DIAGNOSIS — M25562 Pain in left knee: Secondary | ICD-10-CM | POA: Diagnosis not present

## 2014-12-23 DIAGNOSIS — I10 Essential (primary) hypertension: Secondary | ICD-10-CM | POA: Diagnosis not present

## 2014-12-25 DIAGNOSIS — I890 Lymphedema, not elsewhere classified: Secondary | ICD-10-CM | POA: Diagnosis not present

## 2014-12-25 DIAGNOSIS — M1712 Unilateral primary osteoarthritis, left knee: Secondary | ICD-10-CM | POA: Diagnosis not present

## 2015-01-04 DIAGNOSIS — I10 Essential (primary) hypertension: Secondary | ICD-10-CM | POA: Diagnosis not present

## 2015-01-04 DIAGNOSIS — E038 Other specified hypothyroidism: Secondary | ICD-10-CM | POA: Diagnosis not present

## 2015-01-04 DIAGNOSIS — E119 Type 2 diabetes mellitus without complications: Secondary | ICD-10-CM | POA: Diagnosis not present

## 2015-01-07 DIAGNOSIS — R0602 Shortness of breath: Secondary | ICD-10-CM | POA: Diagnosis not present

## 2015-01-07 DIAGNOSIS — I480 Paroxysmal atrial fibrillation: Secondary | ICD-10-CM | POA: Diagnosis not present

## 2015-01-07 DIAGNOSIS — E119 Type 2 diabetes mellitus without complications: Secondary | ICD-10-CM | POA: Diagnosis not present

## 2015-01-08 DIAGNOSIS — M1712 Unilateral primary osteoarthritis, left knee: Secondary | ICD-10-CM | POA: Diagnosis not present

## 2015-01-08 DIAGNOSIS — N3281 Overactive bladder: Secondary | ICD-10-CM | POA: Diagnosis not present

## 2015-01-08 DIAGNOSIS — N184 Chronic kidney disease, stage 4 (severe): Secondary | ICD-10-CM | POA: Diagnosis not present

## 2015-01-15 DIAGNOSIS — I480 Paroxysmal atrial fibrillation: Secondary | ICD-10-CM | POA: Diagnosis not present

## 2015-01-15 DIAGNOSIS — F0390 Unspecified dementia without behavioral disturbance: Secondary | ICD-10-CM | POA: Diagnosis not present

## 2015-01-15 DIAGNOSIS — K219 Gastro-esophageal reflux disease without esophagitis: Secondary | ICD-10-CM | POA: Diagnosis not present

## 2015-01-15 DIAGNOSIS — Z7984 Long term (current) use of oral hypoglycemic drugs: Secondary | ICD-10-CM | POA: Diagnosis not present

## 2015-01-15 DIAGNOSIS — N3281 Overactive bladder: Secondary | ICD-10-CM | POA: Diagnosis not present

## 2015-01-15 DIAGNOSIS — Z8701 Personal history of pneumonia (recurrent): Secondary | ICD-10-CM | POA: Diagnosis not present

## 2015-01-15 DIAGNOSIS — J441 Chronic obstructive pulmonary disease with (acute) exacerbation: Secondary | ICD-10-CM | POA: Diagnosis not present

## 2015-01-15 DIAGNOSIS — E559 Vitamin D deficiency, unspecified: Secondary | ICD-10-CM | POA: Diagnosis not present

## 2015-01-15 DIAGNOSIS — Z853 Personal history of malignant neoplasm of breast: Secondary | ICD-10-CM | POA: Diagnosis not present

## 2015-01-15 DIAGNOSIS — I890 Lymphedema, not elsewhere classified: Secondary | ICD-10-CM | POA: Diagnosis not present

## 2015-01-15 DIAGNOSIS — M1712 Unilateral primary osteoarthritis, left knee: Secondary | ICD-10-CM | POA: Diagnosis not present

## 2015-01-15 DIAGNOSIS — E039 Hypothyroidism, unspecified: Secondary | ICD-10-CM | POA: Diagnosis not present

## 2015-01-15 DIAGNOSIS — I130 Hypertensive heart and chronic kidney disease with heart failure and stage 1 through stage 4 chronic kidney disease, or unspecified chronic kidney disease: Secondary | ICD-10-CM | POA: Diagnosis not present

## 2015-01-15 DIAGNOSIS — I509 Heart failure, unspecified: Secondary | ICD-10-CM | POA: Diagnosis not present

## 2015-01-15 DIAGNOSIS — Z87440 Personal history of urinary (tract) infections: Secondary | ICD-10-CM | POA: Diagnosis not present

## 2015-01-15 DIAGNOSIS — E1122 Type 2 diabetes mellitus with diabetic chronic kidney disease: Secondary | ICD-10-CM | POA: Diagnosis not present

## 2015-01-15 DIAGNOSIS — N184 Chronic kidney disease, stage 4 (severe): Secondary | ICD-10-CM | POA: Diagnosis not present

## 2015-01-18 DIAGNOSIS — E1122 Type 2 diabetes mellitus with diabetic chronic kidney disease: Secondary | ICD-10-CM | POA: Diagnosis not present

## 2015-01-18 DIAGNOSIS — Z7984 Long term (current) use of oral hypoglycemic drugs: Secondary | ICD-10-CM | POA: Diagnosis not present

## 2015-01-18 DIAGNOSIS — J441 Chronic obstructive pulmonary disease with (acute) exacerbation: Secondary | ICD-10-CM | POA: Diagnosis not present

## 2015-01-18 DIAGNOSIS — K219 Gastro-esophageal reflux disease without esophagitis: Secondary | ICD-10-CM | POA: Diagnosis not present

## 2015-01-18 DIAGNOSIS — E039 Hypothyroidism, unspecified: Secondary | ICD-10-CM | POA: Diagnosis not present

## 2015-01-18 DIAGNOSIS — Z8701 Personal history of pneumonia (recurrent): Secondary | ICD-10-CM | POA: Diagnosis not present

## 2015-01-18 DIAGNOSIS — I890 Lymphedema, not elsewhere classified: Secondary | ICD-10-CM | POA: Diagnosis not present

## 2015-01-18 DIAGNOSIS — M1712 Unilateral primary osteoarthritis, left knee: Secondary | ICD-10-CM | POA: Diagnosis not present

## 2015-01-18 DIAGNOSIS — I480 Paroxysmal atrial fibrillation: Secondary | ICD-10-CM | POA: Diagnosis not present

## 2015-01-18 DIAGNOSIS — I130 Hypertensive heart and chronic kidney disease with heart failure and stage 1 through stage 4 chronic kidney disease, or unspecified chronic kidney disease: Secondary | ICD-10-CM | POA: Diagnosis not present

## 2015-01-18 DIAGNOSIS — I509 Heart failure, unspecified: Secondary | ICD-10-CM | POA: Diagnosis not present

## 2015-01-18 DIAGNOSIS — N3281 Overactive bladder: Secondary | ICD-10-CM | POA: Diagnosis not present

## 2015-01-18 DIAGNOSIS — E559 Vitamin D deficiency, unspecified: Secondary | ICD-10-CM | POA: Diagnosis not present

## 2015-01-18 DIAGNOSIS — N184 Chronic kidney disease, stage 4 (severe): Secondary | ICD-10-CM | POA: Diagnosis not present

## 2015-01-18 DIAGNOSIS — Z853 Personal history of malignant neoplasm of breast: Secondary | ICD-10-CM | POA: Diagnosis not present

## 2015-01-18 DIAGNOSIS — Z87440 Personal history of urinary (tract) infections: Secondary | ICD-10-CM | POA: Diagnosis not present

## 2015-01-18 DIAGNOSIS — F0390 Unspecified dementia without behavioral disturbance: Secondary | ICD-10-CM | POA: Diagnosis not present

## 2015-01-19 DIAGNOSIS — N184 Chronic kidney disease, stage 4 (severe): Secondary | ICD-10-CM | POA: Diagnosis not present

## 2015-01-19 DIAGNOSIS — E1122 Type 2 diabetes mellitus with diabetic chronic kidney disease: Secondary | ICD-10-CM | POA: Diagnosis not present

## 2015-01-19 DIAGNOSIS — E559 Vitamin D deficiency, unspecified: Secondary | ICD-10-CM | POA: Diagnosis not present

## 2015-01-19 DIAGNOSIS — I130 Hypertensive heart and chronic kidney disease with heart failure and stage 1 through stage 4 chronic kidney disease, or unspecified chronic kidney disease: Secondary | ICD-10-CM | POA: Diagnosis not present

## 2015-01-19 DIAGNOSIS — I509 Heart failure, unspecified: Secondary | ICD-10-CM | POA: Diagnosis not present

## 2015-01-19 DIAGNOSIS — Z853 Personal history of malignant neoplasm of breast: Secondary | ICD-10-CM | POA: Diagnosis not present

## 2015-01-19 DIAGNOSIS — N3281 Overactive bladder: Secondary | ICD-10-CM | POA: Diagnosis not present

## 2015-01-19 DIAGNOSIS — Z8701 Personal history of pneumonia (recurrent): Secondary | ICD-10-CM | POA: Diagnosis not present

## 2015-01-19 DIAGNOSIS — J441 Chronic obstructive pulmonary disease with (acute) exacerbation: Secondary | ICD-10-CM | POA: Diagnosis not present

## 2015-01-19 DIAGNOSIS — F0390 Unspecified dementia without behavioral disturbance: Secondary | ICD-10-CM | POA: Diagnosis not present

## 2015-01-19 DIAGNOSIS — Z7984 Long term (current) use of oral hypoglycemic drugs: Secondary | ICD-10-CM | POA: Diagnosis not present

## 2015-01-19 DIAGNOSIS — M1712 Unilateral primary osteoarthritis, left knee: Secondary | ICD-10-CM | POA: Diagnosis not present

## 2015-01-19 DIAGNOSIS — I480 Paroxysmal atrial fibrillation: Secondary | ICD-10-CM | POA: Diagnosis not present

## 2015-01-19 DIAGNOSIS — Z87440 Personal history of urinary (tract) infections: Secondary | ICD-10-CM | POA: Diagnosis not present

## 2015-01-19 DIAGNOSIS — K219 Gastro-esophageal reflux disease without esophagitis: Secondary | ICD-10-CM | POA: Diagnosis not present

## 2015-01-19 DIAGNOSIS — E039 Hypothyroidism, unspecified: Secondary | ICD-10-CM | POA: Diagnosis not present

## 2015-01-19 DIAGNOSIS — I890 Lymphedema, not elsewhere classified: Secondary | ICD-10-CM | POA: Diagnosis not present

## 2015-01-20 DIAGNOSIS — I480 Paroxysmal atrial fibrillation: Secondary | ICD-10-CM | POA: Diagnosis not present

## 2015-01-20 DIAGNOSIS — I130 Hypertensive heart and chronic kidney disease with heart failure and stage 1 through stage 4 chronic kidney disease, or unspecified chronic kidney disease: Secondary | ICD-10-CM | POA: Diagnosis not present

## 2015-01-20 DIAGNOSIS — E039 Hypothyroidism, unspecified: Secondary | ICD-10-CM | POA: Diagnosis not present

## 2015-01-20 DIAGNOSIS — I509 Heart failure, unspecified: Secondary | ICD-10-CM | POA: Diagnosis not present

## 2015-01-20 DIAGNOSIS — Z87440 Personal history of urinary (tract) infections: Secondary | ICD-10-CM | POA: Diagnosis not present

## 2015-01-20 DIAGNOSIS — Z853 Personal history of malignant neoplasm of breast: Secondary | ICD-10-CM | POA: Diagnosis not present

## 2015-01-20 DIAGNOSIS — M1712 Unilateral primary osteoarthritis, left knee: Secondary | ICD-10-CM | POA: Diagnosis not present

## 2015-01-20 DIAGNOSIS — N184 Chronic kidney disease, stage 4 (severe): Secondary | ICD-10-CM | POA: Diagnosis not present

## 2015-01-20 DIAGNOSIS — J441 Chronic obstructive pulmonary disease with (acute) exacerbation: Secondary | ICD-10-CM | POA: Diagnosis not present

## 2015-01-20 DIAGNOSIS — E1122 Type 2 diabetes mellitus with diabetic chronic kidney disease: Secondary | ICD-10-CM | POA: Diagnosis not present

## 2015-01-20 DIAGNOSIS — F0390 Unspecified dementia without behavioral disturbance: Secondary | ICD-10-CM | POA: Diagnosis not present

## 2015-01-20 DIAGNOSIS — Z8701 Personal history of pneumonia (recurrent): Secondary | ICD-10-CM | POA: Diagnosis not present

## 2015-01-20 DIAGNOSIS — K219 Gastro-esophageal reflux disease without esophagitis: Secondary | ICD-10-CM | POA: Diagnosis not present

## 2015-01-20 DIAGNOSIS — E559 Vitamin D deficiency, unspecified: Secondary | ICD-10-CM | POA: Diagnosis not present

## 2015-01-20 DIAGNOSIS — I890 Lymphedema, not elsewhere classified: Secondary | ICD-10-CM | POA: Diagnosis not present

## 2015-01-20 DIAGNOSIS — N3281 Overactive bladder: Secondary | ICD-10-CM | POA: Diagnosis not present

## 2015-01-20 DIAGNOSIS — Z7984 Long term (current) use of oral hypoglycemic drugs: Secondary | ICD-10-CM | POA: Diagnosis not present

## 2015-01-25 DIAGNOSIS — Z8701 Personal history of pneumonia (recurrent): Secondary | ICD-10-CM | POA: Diagnosis not present

## 2015-01-25 DIAGNOSIS — E039 Hypothyroidism, unspecified: Secondary | ICD-10-CM | POA: Diagnosis not present

## 2015-01-25 DIAGNOSIS — I509 Heart failure, unspecified: Secondary | ICD-10-CM | POA: Diagnosis not present

## 2015-01-25 DIAGNOSIS — Z7984 Long term (current) use of oral hypoglycemic drugs: Secondary | ICD-10-CM | POA: Diagnosis not present

## 2015-01-25 DIAGNOSIS — F0390 Unspecified dementia without behavioral disturbance: Secondary | ICD-10-CM | POA: Diagnosis not present

## 2015-01-25 DIAGNOSIS — Z853 Personal history of malignant neoplasm of breast: Secondary | ICD-10-CM | POA: Diagnosis not present

## 2015-01-25 DIAGNOSIS — N3281 Overactive bladder: Secondary | ICD-10-CM | POA: Diagnosis not present

## 2015-01-25 DIAGNOSIS — J441 Chronic obstructive pulmonary disease with (acute) exacerbation: Secondary | ICD-10-CM | POA: Diagnosis not present

## 2015-01-25 DIAGNOSIS — I130 Hypertensive heart and chronic kidney disease with heart failure and stage 1 through stage 4 chronic kidney disease, or unspecified chronic kidney disease: Secondary | ICD-10-CM | POA: Diagnosis not present

## 2015-01-25 DIAGNOSIS — N184 Chronic kidney disease, stage 4 (severe): Secondary | ICD-10-CM | POA: Diagnosis not present

## 2015-01-25 DIAGNOSIS — K219 Gastro-esophageal reflux disease without esophagitis: Secondary | ICD-10-CM | POA: Diagnosis not present

## 2015-01-25 DIAGNOSIS — I890 Lymphedema, not elsewhere classified: Secondary | ICD-10-CM | POA: Diagnosis not present

## 2015-01-25 DIAGNOSIS — E1122 Type 2 diabetes mellitus with diabetic chronic kidney disease: Secondary | ICD-10-CM | POA: Diagnosis not present

## 2015-01-25 DIAGNOSIS — M1712 Unilateral primary osteoarthritis, left knee: Secondary | ICD-10-CM | POA: Diagnosis not present

## 2015-01-25 DIAGNOSIS — E559 Vitamin D deficiency, unspecified: Secondary | ICD-10-CM | POA: Diagnosis not present

## 2015-01-25 DIAGNOSIS — I480 Paroxysmal atrial fibrillation: Secondary | ICD-10-CM | POA: Diagnosis not present

## 2015-01-25 DIAGNOSIS — Z87440 Personal history of urinary (tract) infections: Secondary | ICD-10-CM | POA: Diagnosis not present

## 2015-01-26 DIAGNOSIS — K219 Gastro-esophageal reflux disease without esophagitis: Secondary | ICD-10-CM | POA: Diagnosis not present

## 2015-01-26 DIAGNOSIS — J441 Chronic obstructive pulmonary disease with (acute) exacerbation: Secondary | ICD-10-CM | POA: Diagnosis not present

## 2015-01-26 DIAGNOSIS — I890 Lymphedema, not elsewhere classified: Secondary | ICD-10-CM | POA: Diagnosis not present

## 2015-01-26 DIAGNOSIS — N3281 Overactive bladder: Secondary | ICD-10-CM | POA: Diagnosis not present

## 2015-01-26 DIAGNOSIS — Z7984 Long term (current) use of oral hypoglycemic drugs: Secondary | ICD-10-CM | POA: Diagnosis not present

## 2015-01-26 DIAGNOSIS — I130 Hypertensive heart and chronic kidney disease with heart failure and stage 1 through stage 4 chronic kidney disease, or unspecified chronic kidney disease: Secondary | ICD-10-CM | POA: Diagnosis not present

## 2015-01-26 DIAGNOSIS — I509 Heart failure, unspecified: Secondary | ICD-10-CM | POA: Diagnosis not present

## 2015-01-26 DIAGNOSIS — F0390 Unspecified dementia without behavioral disturbance: Secondary | ICD-10-CM | POA: Diagnosis not present

## 2015-01-26 DIAGNOSIS — M1712 Unilateral primary osteoarthritis, left knee: Secondary | ICD-10-CM | POA: Diagnosis not present

## 2015-01-26 DIAGNOSIS — Z853 Personal history of malignant neoplasm of breast: Secondary | ICD-10-CM | POA: Diagnosis not present

## 2015-01-26 DIAGNOSIS — N184 Chronic kidney disease, stage 4 (severe): Secondary | ICD-10-CM | POA: Diagnosis not present

## 2015-01-26 DIAGNOSIS — Z8701 Personal history of pneumonia (recurrent): Secondary | ICD-10-CM | POA: Diagnosis not present

## 2015-01-26 DIAGNOSIS — E559 Vitamin D deficiency, unspecified: Secondary | ICD-10-CM | POA: Diagnosis not present

## 2015-01-26 DIAGNOSIS — E1122 Type 2 diabetes mellitus with diabetic chronic kidney disease: Secondary | ICD-10-CM | POA: Diagnosis not present

## 2015-01-26 DIAGNOSIS — Z87440 Personal history of urinary (tract) infections: Secondary | ICD-10-CM | POA: Diagnosis not present

## 2015-01-26 DIAGNOSIS — E039 Hypothyroidism, unspecified: Secondary | ICD-10-CM | POA: Diagnosis not present

## 2015-01-26 DIAGNOSIS — I480 Paroxysmal atrial fibrillation: Secondary | ICD-10-CM | POA: Diagnosis not present

## 2015-01-29 DIAGNOSIS — Z853 Personal history of malignant neoplasm of breast: Secondary | ICD-10-CM | POA: Diagnosis not present

## 2015-01-29 DIAGNOSIS — J441 Chronic obstructive pulmonary disease with (acute) exacerbation: Secondary | ICD-10-CM | POA: Diagnosis not present

## 2015-01-29 DIAGNOSIS — N184 Chronic kidney disease, stage 4 (severe): Secondary | ICD-10-CM | POA: Diagnosis not present

## 2015-01-29 DIAGNOSIS — I890 Lymphedema, not elsewhere classified: Secondary | ICD-10-CM | POA: Diagnosis not present

## 2015-01-29 DIAGNOSIS — I509 Heart failure, unspecified: Secondary | ICD-10-CM | POA: Diagnosis not present

## 2015-01-29 DIAGNOSIS — I130 Hypertensive heart and chronic kidney disease with heart failure and stage 1 through stage 4 chronic kidney disease, or unspecified chronic kidney disease: Secondary | ICD-10-CM | POA: Diagnosis not present

## 2015-01-29 DIAGNOSIS — E1122 Type 2 diabetes mellitus with diabetic chronic kidney disease: Secondary | ICD-10-CM | POA: Diagnosis not present

## 2015-01-29 DIAGNOSIS — N3281 Overactive bladder: Secondary | ICD-10-CM | POA: Diagnosis not present

## 2015-01-29 DIAGNOSIS — F0390 Unspecified dementia without behavioral disturbance: Secondary | ICD-10-CM | POA: Diagnosis not present

## 2015-01-29 DIAGNOSIS — Z7984 Long term (current) use of oral hypoglycemic drugs: Secondary | ICD-10-CM | POA: Diagnosis not present

## 2015-01-29 DIAGNOSIS — Z87440 Personal history of urinary (tract) infections: Secondary | ICD-10-CM | POA: Diagnosis not present

## 2015-01-29 DIAGNOSIS — E039 Hypothyroidism, unspecified: Secondary | ICD-10-CM | POA: Diagnosis not present

## 2015-01-29 DIAGNOSIS — K219 Gastro-esophageal reflux disease without esophagitis: Secondary | ICD-10-CM | POA: Diagnosis not present

## 2015-01-29 DIAGNOSIS — I480 Paroxysmal atrial fibrillation: Secondary | ICD-10-CM | POA: Diagnosis not present

## 2015-01-29 DIAGNOSIS — Z8701 Personal history of pneumonia (recurrent): Secondary | ICD-10-CM | POA: Diagnosis not present

## 2015-01-29 DIAGNOSIS — M1712 Unilateral primary osteoarthritis, left knee: Secondary | ICD-10-CM | POA: Diagnosis not present

## 2015-01-29 DIAGNOSIS — E559 Vitamin D deficiency, unspecified: Secondary | ICD-10-CM | POA: Diagnosis not present

## 2015-01-30 DIAGNOSIS — Z87440 Personal history of urinary (tract) infections: Secondary | ICD-10-CM | POA: Diagnosis not present

## 2015-01-30 DIAGNOSIS — N3281 Overactive bladder: Secondary | ICD-10-CM | POA: Diagnosis not present

## 2015-01-30 DIAGNOSIS — E559 Vitamin D deficiency, unspecified: Secondary | ICD-10-CM | POA: Diagnosis not present

## 2015-01-30 DIAGNOSIS — M1712 Unilateral primary osteoarthritis, left knee: Secondary | ICD-10-CM | POA: Diagnosis not present

## 2015-01-30 DIAGNOSIS — F0390 Unspecified dementia without behavioral disturbance: Secondary | ICD-10-CM | POA: Diagnosis not present

## 2015-01-30 DIAGNOSIS — Z8701 Personal history of pneumonia (recurrent): Secondary | ICD-10-CM | POA: Diagnosis not present

## 2015-01-30 DIAGNOSIS — Z853 Personal history of malignant neoplasm of breast: Secondary | ICD-10-CM | POA: Diagnosis not present

## 2015-01-30 DIAGNOSIS — I130 Hypertensive heart and chronic kidney disease with heart failure and stage 1 through stage 4 chronic kidney disease, or unspecified chronic kidney disease: Secondary | ICD-10-CM | POA: Diagnosis not present

## 2015-01-30 DIAGNOSIS — K219 Gastro-esophageal reflux disease without esophagitis: Secondary | ICD-10-CM | POA: Diagnosis not present

## 2015-01-30 DIAGNOSIS — N184 Chronic kidney disease, stage 4 (severe): Secondary | ICD-10-CM | POA: Diagnosis not present

## 2015-01-30 DIAGNOSIS — I480 Paroxysmal atrial fibrillation: Secondary | ICD-10-CM | POA: Diagnosis not present

## 2015-01-30 DIAGNOSIS — E039 Hypothyroidism, unspecified: Secondary | ICD-10-CM | POA: Diagnosis not present

## 2015-01-30 DIAGNOSIS — I890 Lymphedema, not elsewhere classified: Secondary | ICD-10-CM | POA: Diagnosis not present

## 2015-01-30 DIAGNOSIS — E1122 Type 2 diabetes mellitus with diabetic chronic kidney disease: Secondary | ICD-10-CM | POA: Diagnosis not present

## 2015-01-30 DIAGNOSIS — I509 Heart failure, unspecified: Secondary | ICD-10-CM | POA: Diagnosis not present

## 2015-01-30 DIAGNOSIS — J441 Chronic obstructive pulmonary disease with (acute) exacerbation: Secondary | ICD-10-CM | POA: Diagnosis not present

## 2015-01-30 DIAGNOSIS — Z7984 Long term (current) use of oral hypoglycemic drugs: Secondary | ICD-10-CM | POA: Diagnosis not present

## 2015-02-01 DIAGNOSIS — E1122 Type 2 diabetes mellitus with diabetic chronic kidney disease: Secondary | ICD-10-CM | POA: Diagnosis not present

## 2015-02-01 DIAGNOSIS — M1712 Unilateral primary osteoarthritis, left knee: Secondary | ICD-10-CM | POA: Diagnosis not present

## 2015-02-01 DIAGNOSIS — J441 Chronic obstructive pulmonary disease with (acute) exacerbation: Secondary | ICD-10-CM | POA: Diagnosis not present

## 2015-02-01 DIAGNOSIS — F0390 Unspecified dementia without behavioral disturbance: Secondary | ICD-10-CM | POA: Diagnosis not present

## 2015-02-01 DIAGNOSIS — E039 Hypothyroidism, unspecified: Secondary | ICD-10-CM | POA: Diagnosis not present

## 2015-02-01 DIAGNOSIS — I890 Lymphedema, not elsewhere classified: Secondary | ICD-10-CM | POA: Diagnosis not present

## 2015-02-01 DIAGNOSIS — N184 Chronic kidney disease, stage 4 (severe): Secondary | ICD-10-CM | POA: Diagnosis not present

## 2015-02-01 DIAGNOSIS — E559 Vitamin D deficiency, unspecified: Secondary | ICD-10-CM | POA: Diagnosis not present

## 2015-02-01 DIAGNOSIS — N3281 Overactive bladder: Secondary | ICD-10-CM | POA: Diagnosis not present

## 2015-02-01 DIAGNOSIS — I509 Heart failure, unspecified: Secondary | ICD-10-CM | POA: Diagnosis not present

## 2015-02-01 DIAGNOSIS — Z853 Personal history of malignant neoplasm of breast: Secondary | ICD-10-CM | POA: Diagnosis not present

## 2015-02-01 DIAGNOSIS — K219 Gastro-esophageal reflux disease without esophagitis: Secondary | ICD-10-CM | POA: Diagnosis not present

## 2015-02-01 DIAGNOSIS — Z8701 Personal history of pneumonia (recurrent): Secondary | ICD-10-CM | POA: Diagnosis not present

## 2015-02-01 DIAGNOSIS — I130 Hypertensive heart and chronic kidney disease with heart failure and stage 1 through stage 4 chronic kidney disease, or unspecified chronic kidney disease: Secondary | ICD-10-CM | POA: Diagnosis not present

## 2015-02-01 DIAGNOSIS — I480 Paroxysmal atrial fibrillation: Secondary | ICD-10-CM | POA: Diagnosis not present

## 2015-02-01 DIAGNOSIS — Z87440 Personal history of urinary (tract) infections: Secondary | ICD-10-CM | POA: Diagnosis not present

## 2015-02-01 DIAGNOSIS — Z7984 Long term (current) use of oral hypoglycemic drugs: Secondary | ICD-10-CM | POA: Diagnosis not present

## 2015-02-02 DIAGNOSIS — E039 Hypothyroidism, unspecified: Secondary | ICD-10-CM | POA: Diagnosis not present

## 2015-02-02 DIAGNOSIS — I890 Lymphedema, not elsewhere classified: Secondary | ICD-10-CM | POA: Diagnosis not present

## 2015-02-02 DIAGNOSIS — Z8701 Personal history of pneumonia (recurrent): Secondary | ICD-10-CM | POA: Diagnosis not present

## 2015-02-02 DIAGNOSIS — I480 Paroxysmal atrial fibrillation: Secondary | ICD-10-CM | POA: Diagnosis not present

## 2015-02-02 DIAGNOSIS — N3281 Overactive bladder: Secondary | ICD-10-CM | POA: Diagnosis not present

## 2015-02-02 DIAGNOSIS — J441 Chronic obstructive pulmonary disease with (acute) exacerbation: Secondary | ICD-10-CM | POA: Diagnosis not present

## 2015-02-02 DIAGNOSIS — F0390 Unspecified dementia without behavioral disturbance: Secondary | ICD-10-CM | POA: Diagnosis not present

## 2015-02-02 DIAGNOSIS — E1122 Type 2 diabetes mellitus with diabetic chronic kidney disease: Secondary | ICD-10-CM | POA: Diagnosis not present

## 2015-02-02 DIAGNOSIS — Z7984 Long term (current) use of oral hypoglycemic drugs: Secondary | ICD-10-CM | POA: Diagnosis not present

## 2015-02-02 DIAGNOSIS — Z853 Personal history of malignant neoplasm of breast: Secondary | ICD-10-CM | POA: Diagnosis not present

## 2015-02-02 DIAGNOSIS — N184 Chronic kidney disease, stage 4 (severe): Secondary | ICD-10-CM | POA: Diagnosis not present

## 2015-02-02 DIAGNOSIS — M1712 Unilateral primary osteoarthritis, left knee: Secondary | ICD-10-CM | POA: Diagnosis not present

## 2015-02-02 DIAGNOSIS — I509 Heart failure, unspecified: Secondary | ICD-10-CM | POA: Diagnosis not present

## 2015-02-02 DIAGNOSIS — Z87440 Personal history of urinary (tract) infections: Secondary | ICD-10-CM | POA: Diagnosis not present

## 2015-02-02 DIAGNOSIS — I130 Hypertensive heart and chronic kidney disease with heart failure and stage 1 through stage 4 chronic kidney disease, or unspecified chronic kidney disease: Secondary | ICD-10-CM | POA: Diagnosis not present

## 2015-02-02 DIAGNOSIS — K219 Gastro-esophageal reflux disease without esophagitis: Secondary | ICD-10-CM | POA: Diagnosis not present

## 2015-02-02 DIAGNOSIS — E559 Vitamin D deficiency, unspecified: Secondary | ICD-10-CM | POA: Diagnosis not present

## 2015-02-03 DIAGNOSIS — I480 Paroxysmal atrial fibrillation: Secondary | ICD-10-CM | POA: Diagnosis not present

## 2015-02-03 DIAGNOSIS — Z853 Personal history of malignant neoplasm of breast: Secondary | ICD-10-CM | POA: Diagnosis not present

## 2015-02-03 DIAGNOSIS — I509 Heart failure, unspecified: Secondary | ICD-10-CM | POA: Diagnosis not present

## 2015-02-03 DIAGNOSIS — E039 Hypothyroidism, unspecified: Secondary | ICD-10-CM | POA: Diagnosis not present

## 2015-02-03 DIAGNOSIS — I130 Hypertensive heart and chronic kidney disease with heart failure and stage 1 through stage 4 chronic kidney disease, or unspecified chronic kidney disease: Secondary | ICD-10-CM | POA: Diagnosis not present

## 2015-02-03 DIAGNOSIS — F0390 Unspecified dementia without behavioral disturbance: Secondary | ICD-10-CM | POA: Diagnosis not present

## 2015-02-03 DIAGNOSIS — E559 Vitamin D deficiency, unspecified: Secondary | ICD-10-CM | POA: Diagnosis not present

## 2015-02-03 DIAGNOSIS — M1712 Unilateral primary osteoarthritis, left knee: Secondary | ICD-10-CM | POA: Diagnosis not present

## 2015-02-03 DIAGNOSIS — I890 Lymphedema, not elsewhere classified: Secondary | ICD-10-CM | POA: Diagnosis not present

## 2015-02-03 DIAGNOSIS — N3281 Overactive bladder: Secondary | ICD-10-CM | POA: Diagnosis not present

## 2015-02-03 DIAGNOSIS — Z87440 Personal history of urinary (tract) infections: Secondary | ICD-10-CM | POA: Diagnosis not present

## 2015-02-03 DIAGNOSIS — N184 Chronic kidney disease, stage 4 (severe): Secondary | ICD-10-CM | POA: Diagnosis not present

## 2015-02-03 DIAGNOSIS — J441 Chronic obstructive pulmonary disease with (acute) exacerbation: Secondary | ICD-10-CM | POA: Diagnosis not present

## 2015-02-03 DIAGNOSIS — Z7984 Long term (current) use of oral hypoglycemic drugs: Secondary | ICD-10-CM | POA: Diagnosis not present

## 2015-02-03 DIAGNOSIS — E1122 Type 2 diabetes mellitus with diabetic chronic kidney disease: Secondary | ICD-10-CM | POA: Diagnosis not present

## 2015-02-03 DIAGNOSIS — Z8701 Personal history of pneumonia (recurrent): Secondary | ICD-10-CM | POA: Diagnosis not present

## 2015-02-03 DIAGNOSIS — K219 Gastro-esophageal reflux disease without esophagitis: Secondary | ICD-10-CM | POA: Diagnosis not present

## 2015-02-04 DIAGNOSIS — E119 Type 2 diabetes mellitus without complications: Secondary | ICD-10-CM | POA: Diagnosis not present

## 2015-02-04 DIAGNOSIS — M1712 Unilateral primary osteoarthritis, left knee: Secondary | ICD-10-CM | POA: Diagnosis not present

## 2015-02-04 DIAGNOSIS — J441 Chronic obstructive pulmonary disease with (acute) exacerbation: Secondary | ICD-10-CM | POA: Diagnosis not present

## 2015-02-04 DIAGNOSIS — Z7984 Long term (current) use of oral hypoglycemic drugs: Secondary | ICD-10-CM | POA: Diagnosis not present

## 2015-02-04 DIAGNOSIS — R609 Edema, unspecified: Secondary | ICD-10-CM | POA: Diagnosis not present

## 2015-02-04 DIAGNOSIS — E039 Hypothyroidism, unspecified: Secondary | ICD-10-CM | POA: Diagnosis not present

## 2015-02-04 DIAGNOSIS — I509 Heart failure, unspecified: Secondary | ICD-10-CM | POA: Diagnosis not present

## 2015-02-04 DIAGNOSIS — I890 Lymphedema, not elsewhere classified: Secondary | ICD-10-CM | POA: Diagnosis not present

## 2015-02-04 DIAGNOSIS — E1122 Type 2 diabetes mellitus with diabetic chronic kidney disease: Secondary | ICD-10-CM | POA: Diagnosis not present

## 2015-02-04 DIAGNOSIS — Z853 Personal history of malignant neoplasm of breast: Secondary | ICD-10-CM | POA: Diagnosis not present

## 2015-02-04 DIAGNOSIS — K219 Gastro-esophageal reflux disease without esophagitis: Secondary | ICD-10-CM | POA: Diagnosis not present

## 2015-02-04 DIAGNOSIS — N3281 Overactive bladder: Secondary | ICD-10-CM | POA: Diagnosis not present

## 2015-02-04 DIAGNOSIS — I10 Essential (primary) hypertension: Secondary | ICD-10-CM | POA: Diagnosis not present

## 2015-02-04 DIAGNOSIS — Z8701 Personal history of pneumonia (recurrent): Secondary | ICD-10-CM | POA: Diagnosis not present

## 2015-02-04 DIAGNOSIS — E559 Vitamin D deficiency, unspecified: Secondary | ICD-10-CM | POA: Diagnosis not present

## 2015-02-04 DIAGNOSIS — F0390 Unspecified dementia without behavioral disturbance: Secondary | ICD-10-CM | POA: Diagnosis not present

## 2015-02-04 DIAGNOSIS — I130 Hypertensive heart and chronic kidney disease with heart failure and stage 1 through stage 4 chronic kidney disease, or unspecified chronic kidney disease: Secondary | ICD-10-CM | POA: Diagnosis not present

## 2015-02-04 DIAGNOSIS — Z87440 Personal history of urinary (tract) infections: Secondary | ICD-10-CM | POA: Diagnosis not present

## 2015-02-04 DIAGNOSIS — N184 Chronic kidney disease, stage 4 (severe): Secondary | ICD-10-CM | POA: Diagnosis not present

## 2015-02-04 DIAGNOSIS — I480 Paroxysmal atrial fibrillation: Secondary | ICD-10-CM | POA: Diagnosis not present

## 2015-02-09 DIAGNOSIS — E039 Hypothyroidism, unspecified: Secondary | ICD-10-CM | POA: Diagnosis not present

## 2015-02-09 DIAGNOSIS — N184 Chronic kidney disease, stage 4 (severe): Secondary | ICD-10-CM | POA: Diagnosis not present

## 2015-02-09 DIAGNOSIS — Z7984 Long term (current) use of oral hypoglycemic drugs: Secondary | ICD-10-CM | POA: Diagnosis not present

## 2015-02-09 DIAGNOSIS — N3281 Overactive bladder: Secondary | ICD-10-CM | POA: Diagnosis not present

## 2015-02-09 DIAGNOSIS — Z87440 Personal history of urinary (tract) infections: Secondary | ICD-10-CM | POA: Diagnosis not present

## 2015-02-09 DIAGNOSIS — J441 Chronic obstructive pulmonary disease with (acute) exacerbation: Secondary | ICD-10-CM | POA: Diagnosis not present

## 2015-02-09 DIAGNOSIS — I480 Paroxysmal atrial fibrillation: Secondary | ICD-10-CM | POA: Diagnosis not present

## 2015-02-09 DIAGNOSIS — E1122 Type 2 diabetes mellitus with diabetic chronic kidney disease: Secondary | ICD-10-CM | POA: Diagnosis not present

## 2015-02-09 DIAGNOSIS — I509 Heart failure, unspecified: Secondary | ICD-10-CM | POA: Diagnosis not present

## 2015-02-09 DIAGNOSIS — F0390 Unspecified dementia without behavioral disturbance: Secondary | ICD-10-CM | POA: Diagnosis not present

## 2015-02-09 DIAGNOSIS — I130 Hypertensive heart and chronic kidney disease with heart failure and stage 1 through stage 4 chronic kidney disease, or unspecified chronic kidney disease: Secondary | ICD-10-CM | POA: Diagnosis not present

## 2015-02-09 DIAGNOSIS — I890 Lymphedema, not elsewhere classified: Secondary | ICD-10-CM | POA: Diagnosis not present

## 2015-02-09 DIAGNOSIS — Z8701 Personal history of pneumonia (recurrent): Secondary | ICD-10-CM | POA: Diagnosis not present

## 2015-02-09 DIAGNOSIS — M1712 Unilateral primary osteoarthritis, left knee: Secondary | ICD-10-CM | POA: Diagnosis not present

## 2015-02-09 DIAGNOSIS — K219 Gastro-esophageal reflux disease without esophagitis: Secondary | ICD-10-CM | POA: Diagnosis not present

## 2015-02-09 DIAGNOSIS — Z853 Personal history of malignant neoplasm of breast: Secondary | ICD-10-CM | POA: Diagnosis not present

## 2015-02-09 DIAGNOSIS — E559 Vitamin D deficiency, unspecified: Secondary | ICD-10-CM | POA: Diagnosis not present

## 2015-02-11 DIAGNOSIS — I509 Heart failure, unspecified: Secondary | ICD-10-CM | POA: Diagnosis not present

## 2015-02-11 DIAGNOSIS — F0390 Unspecified dementia without behavioral disturbance: Secondary | ICD-10-CM | POA: Diagnosis not present

## 2015-02-11 DIAGNOSIS — J441 Chronic obstructive pulmonary disease with (acute) exacerbation: Secondary | ICD-10-CM | POA: Diagnosis not present

## 2015-02-11 DIAGNOSIS — I890 Lymphedema, not elsewhere classified: Secondary | ICD-10-CM | POA: Diagnosis not present

## 2015-02-11 DIAGNOSIS — Z8701 Personal history of pneumonia (recurrent): Secondary | ICD-10-CM | POA: Diagnosis not present

## 2015-02-11 DIAGNOSIS — E1122 Type 2 diabetes mellitus with diabetic chronic kidney disease: Secondary | ICD-10-CM | POA: Diagnosis not present

## 2015-02-11 DIAGNOSIS — N3281 Overactive bladder: Secondary | ICD-10-CM | POA: Diagnosis not present

## 2015-02-11 DIAGNOSIS — E559 Vitamin D deficiency, unspecified: Secondary | ICD-10-CM | POA: Diagnosis not present

## 2015-02-11 DIAGNOSIS — Z853 Personal history of malignant neoplasm of breast: Secondary | ICD-10-CM | POA: Diagnosis not present

## 2015-02-11 DIAGNOSIS — I130 Hypertensive heart and chronic kidney disease with heart failure and stage 1 through stage 4 chronic kidney disease, or unspecified chronic kidney disease: Secondary | ICD-10-CM | POA: Diagnosis not present

## 2015-02-11 DIAGNOSIS — K219 Gastro-esophageal reflux disease without esophagitis: Secondary | ICD-10-CM | POA: Diagnosis not present

## 2015-02-11 DIAGNOSIS — Z7984 Long term (current) use of oral hypoglycemic drugs: Secondary | ICD-10-CM | POA: Diagnosis not present

## 2015-02-11 DIAGNOSIS — Z87440 Personal history of urinary (tract) infections: Secondary | ICD-10-CM | POA: Diagnosis not present

## 2015-02-11 DIAGNOSIS — E039 Hypothyroidism, unspecified: Secondary | ICD-10-CM | POA: Diagnosis not present

## 2015-02-11 DIAGNOSIS — M1712 Unilateral primary osteoarthritis, left knee: Secondary | ICD-10-CM | POA: Diagnosis not present

## 2015-02-11 DIAGNOSIS — N184 Chronic kidney disease, stage 4 (severe): Secondary | ICD-10-CM | POA: Diagnosis not present

## 2015-02-11 DIAGNOSIS — I480 Paroxysmal atrial fibrillation: Secondary | ICD-10-CM | POA: Diagnosis not present

## 2015-02-12 DIAGNOSIS — N184 Chronic kidney disease, stage 4 (severe): Secondary | ICD-10-CM | POA: Diagnosis not present

## 2015-02-12 DIAGNOSIS — E039 Hypothyroidism, unspecified: Secondary | ICD-10-CM | POA: Diagnosis not present

## 2015-02-12 DIAGNOSIS — N3281 Overactive bladder: Secondary | ICD-10-CM | POA: Diagnosis not present

## 2015-02-12 DIAGNOSIS — K219 Gastro-esophageal reflux disease without esophagitis: Secondary | ICD-10-CM | POA: Diagnosis not present

## 2015-02-12 DIAGNOSIS — Z7984 Long term (current) use of oral hypoglycemic drugs: Secondary | ICD-10-CM | POA: Diagnosis not present

## 2015-02-12 DIAGNOSIS — J441 Chronic obstructive pulmonary disease with (acute) exacerbation: Secondary | ICD-10-CM | POA: Diagnosis not present

## 2015-02-12 DIAGNOSIS — I480 Paroxysmal atrial fibrillation: Secondary | ICD-10-CM | POA: Diagnosis not present

## 2015-02-12 DIAGNOSIS — I890 Lymphedema, not elsewhere classified: Secondary | ICD-10-CM | POA: Diagnosis not present

## 2015-02-12 DIAGNOSIS — I130 Hypertensive heart and chronic kidney disease with heart failure and stage 1 through stage 4 chronic kidney disease, or unspecified chronic kidney disease: Secondary | ICD-10-CM | POA: Diagnosis not present

## 2015-02-12 DIAGNOSIS — Z87440 Personal history of urinary (tract) infections: Secondary | ICD-10-CM | POA: Diagnosis not present

## 2015-02-12 DIAGNOSIS — M1712 Unilateral primary osteoarthritis, left knee: Secondary | ICD-10-CM | POA: Diagnosis not present

## 2015-02-12 DIAGNOSIS — F0390 Unspecified dementia without behavioral disturbance: Secondary | ICD-10-CM | POA: Diagnosis not present

## 2015-02-12 DIAGNOSIS — E559 Vitamin D deficiency, unspecified: Secondary | ICD-10-CM | POA: Diagnosis not present

## 2015-02-12 DIAGNOSIS — E1122 Type 2 diabetes mellitus with diabetic chronic kidney disease: Secondary | ICD-10-CM | POA: Diagnosis not present

## 2015-02-12 DIAGNOSIS — I509 Heart failure, unspecified: Secondary | ICD-10-CM | POA: Diagnosis not present

## 2015-02-12 DIAGNOSIS — Z8701 Personal history of pneumonia (recurrent): Secondary | ICD-10-CM | POA: Diagnosis not present

## 2015-02-12 DIAGNOSIS — Z853 Personal history of malignant neoplasm of breast: Secondary | ICD-10-CM | POA: Diagnosis not present

## 2015-02-16 DIAGNOSIS — I890 Lymphedema, not elsewhere classified: Secondary | ICD-10-CM | POA: Diagnosis not present

## 2015-02-16 DIAGNOSIS — M1712 Unilateral primary osteoarthritis, left knee: Secondary | ICD-10-CM | POA: Diagnosis not present

## 2015-02-16 DIAGNOSIS — Z853 Personal history of malignant neoplasm of breast: Secondary | ICD-10-CM | POA: Diagnosis not present

## 2015-02-16 DIAGNOSIS — F0390 Unspecified dementia without behavioral disturbance: Secondary | ICD-10-CM | POA: Diagnosis not present

## 2015-02-16 DIAGNOSIS — J441 Chronic obstructive pulmonary disease with (acute) exacerbation: Secondary | ICD-10-CM | POA: Diagnosis not present

## 2015-02-16 DIAGNOSIS — I509 Heart failure, unspecified: Secondary | ICD-10-CM | POA: Diagnosis not present

## 2015-02-16 DIAGNOSIS — Z7984 Long term (current) use of oral hypoglycemic drugs: Secondary | ICD-10-CM | POA: Diagnosis not present

## 2015-02-16 DIAGNOSIS — Z87440 Personal history of urinary (tract) infections: Secondary | ICD-10-CM | POA: Diagnosis not present

## 2015-02-16 DIAGNOSIS — E1122 Type 2 diabetes mellitus with diabetic chronic kidney disease: Secondary | ICD-10-CM | POA: Diagnosis not present

## 2015-02-16 DIAGNOSIS — E559 Vitamin D deficiency, unspecified: Secondary | ICD-10-CM | POA: Diagnosis not present

## 2015-02-16 DIAGNOSIS — Z8701 Personal history of pneumonia (recurrent): Secondary | ICD-10-CM | POA: Diagnosis not present

## 2015-02-16 DIAGNOSIS — N184 Chronic kidney disease, stage 4 (severe): Secondary | ICD-10-CM | POA: Diagnosis not present

## 2015-02-16 DIAGNOSIS — E039 Hypothyroidism, unspecified: Secondary | ICD-10-CM | POA: Diagnosis not present

## 2015-02-16 DIAGNOSIS — N3281 Overactive bladder: Secondary | ICD-10-CM | POA: Diagnosis not present

## 2015-02-16 DIAGNOSIS — I480 Paroxysmal atrial fibrillation: Secondary | ICD-10-CM | POA: Diagnosis not present

## 2015-02-16 DIAGNOSIS — I130 Hypertensive heart and chronic kidney disease with heart failure and stage 1 through stage 4 chronic kidney disease, or unspecified chronic kidney disease: Secondary | ICD-10-CM | POA: Diagnosis not present

## 2015-02-16 DIAGNOSIS — K219 Gastro-esophageal reflux disease without esophagitis: Secondary | ICD-10-CM | POA: Diagnosis not present

## 2015-02-17 DIAGNOSIS — F0390 Unspecified dementia without behavioral disturbance: Secondary | ICD-10-CM | POA: Diagnosis not present

## 2015-02-17 DIAGNOSIS — J441 Chronic obstructive pulmonary disease with (acute) exacerbation: Secondary | ICD-10-CM | POA: Diagnosis not present

## 2015-02-17 DIAGNOSIS — I480 Paroxysmal atrial fibrillation: Secondary | ICD-10-CM | POA: Diagnosis not present

## 2015-02-17 DIAGNOSIS — Z853 Personal history of malignant neoplasm of breast: Secondary | ICD-10-CM | POA: Diagnosis not present

## 2015-02-17 DIAGNOSIS — E559 Vitamin D deficiency, unspecified: Secondary | ICD-10-CM | POA: Diagnosis not present

## 2015-02-17 DIAGNOSIS — E1122 Type 2 diabetes mellitus with diabetic chronic kidney disease: Secondary | ICD-10-CM | POA: Diagnosis not present

## 2015-02-17 DIAGNOSIS — I890 Lymphedema, not elsewhere classified: Secondary | ICD-10-CM | POA: Diagnosis not present

## 2015-02-17 DIAGNOSIS — Z7984 Long term (current) use of oral hypoglycemic drugs: Secondary | ICD-10-CM | POA: Diagnosis not present

## 2015-02-17 DIAGNOSIS — M1712 Unilateral primary osteoarthritis, left knee: Secondary | ICD-10-CM | POA: Diagnosis not present

## 2015-02-17 DIAGNOSIS — I509 Heart failure, unspecified: Secondary | ICD-10-CM | POA: Diagnosis not present

## 2015-02-17 DIAGNOSIS — E039 Hypothyroidism, unspecified: Secondary | ICD-10-CM | POA: Diagnosis not present

## 2015-02-17 DIAGNOSIS — Z8701 Personal history of pneumonia (recurrent): Secondary | ICD-10-CM | POA: Diagnosis not present

## 2015-02-17 DIAGNOSIS — Z87440 Personal history of urinary (tract) infections: Secondary | ICD-10-CM | POA: Diagnosis not present

## 2015-02-17 DIAGNOSIS — I130 Hypertensive heart and chronic kidney disease with heart failure and stage 1 through stage 4 chronic kidney disease, or unspecified chronic kidney disease: Secondary | ICD-10-CM | POA: Diagnosis not present

## 2015-02-17 DIAGNOSIS — N3281 Overactive bladder: Secondary | ICD-10-CM | POA: Diagnosis not present

## 2015-02-17 DIAGNOSIS — K219 Gastro-esophageal reflux disease without esophagitis: Secondary | ICD-10-CM | POA: Diagnosis not present

## 2015-02-17 DIAGNOSIS — N184 Chronic kidney disease, stage 4 (severe): Secondary | ICD-10-CM | POA: Diagnosis not present

## 2015-02-18 DIAGNOSIS — Z87440 Personal history of urinary (tract) infections: Secondary | ICD-10-CM | POA: Diagnosis not present

## 2015-02-18 DIAGNOSIS — J441 Chronic obstructive pulmonary disease with (acute) exacerbation: Secondary | ICD-10-CM | POA: Diagnosis not present

## 2015-02-18 DIAGNOSIS — K219 Gastro-esophageal reflux disease without esophagitis: Secondary | ICD-10-CM | POA: Diagnosis not present

## 2015-02-18 DIAGNOSIS — Z8701 Personal history of pneumonia (recurrent): Secondary | ICD-10-CM | POA: Diagnosis not present

## 2015-02-18 DIAGNOSIS — I480 Paroxysmal atrial fibrillation: Secondary | ICD-10-CM | POA: Diagnosis not present

## 2015-02-18 DIAGNOSIS — N184 Chronic kidney disease, stage 4 (severe): Secondary | ICD-10-CM | POA: Diagnosis not present

## 2015-02-18 DIAGNOSIS — I509 Heart failure, unspecified: Secondary | ICD-10-CM | POA: Diagnosis not present

## 2015-02-18 DIAGNOSIS — Z7984 Long term (current) use of oral hypoglycemic drugs: Secondary | ICD-10-CM | POA: Diagnosis not present

## 2015-02-18 DIAGNOSIS — I890 Lymphedema, not elsewhere classified: Secondary | ICD-10-CM | POA: Diagnosis not present

## 2015-02-18 DIAGNOSIS — E1122 Type 2 diabetes mellitus with diabetic chronic kidney disease: Secondary | ICD-10-CM | POA: Diagnosis not present

## 2015-02-18 DIAGNOSIS — M1712 Unilateral primary osteoarthritis, left knee: Secondary | ICD-10-CM | POA: Diagnosis not present

## 2015-02-18 DIAGNOSIS — E559 Vitamin D deficiency, unspecified: Secondary | ICD-10-CM | POA: Diagnosis not present

## 2015-02-18 DIAGNOSIS — Z853 Personal history of malignant neoplasm of breast: Secondary | ICD-10-CM | POA: Diagnosis not present

## 2015-02-18 DIAGNOSIS — N3281 Overactive bladder: Secondary | ICD-10-CM | POA: Diagnosis not present

## 2015-02-18 DIAGNOSIS — I130 Hypertensive heart and chronic kidney disease with heart failure and stage 1 through stage 4 chronic kidney disease, or unspecified chronic kidney disease: Secondary | ICD-10-CM | POA: Diagnosis not present

## 2015-02-18 DIAGNOSIS — E039 Hypothyroidism, unspecified: Secondary | ICD-10-CM | POA: Diagnosis not present

## 2015-02-18 DIAGNOSIS — F0390 Unspecified dementia without behavioral disturbance: Secondary | ICD-10-CM | POA: Diagnosis not present

## 2015-02-19 DIAGNOSIS — Z87440 Personal history of urinary (tract) infections: Secondary | ICD-10-CM | POA: Diagnosis not present

## 2015-02-19 DIAGNOSIS — I480 Paroxysmal atrial fibrillation: Secondary | ICD-10-CM | POA: Diagnosis not present

## 2015-02-19 DIAGNOSIS — E1122 Type 2 diabetes mellitus with diabetic chronic kidney disease: Secondary | ICD-10-CM | POA: Diagnosis not present

## 2015-02-19 DIAGNOSIS — Z8701 Personal history of pneumonia (recurrent): Secondary | ICD-10-CM | POA: Diagnosis not present

## 2015-02-19 DIAGNOSIS — I890 Lymphedema, not elsewhere classified: Secondary | ICD-10-CM | POA: Diagnosis not present

## 2015-02-19 DIAGNOSIS — E039 Hypothyroidism, unspecified: Secondary | ICD-10-CM | POA: Diagnosis not present

## 2015-02-19 DIAGNOSIS — I509 Heart failure, unspecified: Secondary | ICD-10-CM | POA: Diagnosis not present

## 2015-02-19 DIAGNOSIS — M1712 Unilateral primary osteoarthritis, left knee: Secondary | ICD-10-CM | POA: Diagnosis not present

## 2015-02-19 DIAGNOSIS — J441 Chronic obstructive pulmonary disease with (acute) exacerbation: Secondary | ICD-10-CM | POA: Diagnosis not present

## 2015-02-19 DIAGNOSIS — Z7984 Long term (current) use of oral hypoglycemic drugs: Secondary | ICD-10-CM | POA: Diagnosis not present

## 2015-02-19 DIAGNOSIS — Z853 Personal history of malignant neoplasm of breast: Secondary | ICD-10-CM | POA: Diagnosis not present

## 2015-02-19 DIAGNOSIS — I130 Hypertensive heart and chronic kidney disease with heart failure and stage 1 through stage 4 chronic kidney disease, or unspecified chronic kidney disease: Secondary | ICD-10-CM | POA: Diagnosis not present

## 2015-02-19 DIAGNOSIS — F0390 Unspecified dementia without behavioral disturbance: Secondary | ICD-10-CM | POA: Diagnosis not present

## 2015-02-19 DIAGNOSIS — E559 Vitamin D deficiency, unspecified: Secondary | ICD-10-CM | POA: Diagnosis not present

## 2015-02-19 DIAGNOSIS — N184 Chronic kidney disease, stage 4 (severe): Secondary | ICD-10-CM | POA: Diagnosis not present

## 2015-02-19 DIAGNOSIS — K219 Gastro-esophageal reflux disease without esophagitis: Secondary | ICD-10-CM | POA: Diagnosis not present

## 2015-02-19 DIAGNOSIS — N3281 Overactive bladder: Secondary | ICD-10-CM | POA: Diagnosis not present

## 2015-02-24 DIAGNOSIS — K219 Gastro-esophageal reflux disease without esophagitis: Secondary | ICD-10-CM | POA: Diagnosis not present

## 2015-02-24 DIAGNOSIS — E1122 Type 2 diabetes mellitus with diabetic chronic kidney disease: Secondary | ICD-10-CM | POA: Diagnosis not present

## 2015-02-24 DIAGNOSIS — F0390 Unspecified dementia without behavioral disturbance: Secondary | ICD-10-CM | POA: Diagnosis not present

## 2015-02-24 DIAGNOSIS — J441 Chronic obstructive pulmonary disease with (acute) exacerbation: Secondary | ICD-10-CM | POA: Diagnosis not present

## 2015-02-24 DIAGNOSIS — I480 Paroxysmal atrial fibrillation: Secondary | ICD-10-CM | POA: Diagnosis not present

## 2015-02-24 DIAGNOSIS — Z853 Personal history of malignant neoplasm of breast: Secondary | ICD-10-CM | POA: Diagnosis not present

## 2015-02-24 DIAGNOSIS — Z87440 Personal history of urinary (tract) infections: Secondary | ICD-10-CM | POA: Diagnosis not present

## 2015-02-24 DIAGNOSIS — Z7984 Long term (current) use of oral hypoglycemic drugs: Secondary | ICD-10-CM | POA: Diagnosis not present

## 2015-02-24 DIAGNOSIS — E559 Vitamin D deficiency, unspecified: Secondary | ICD-10-CM | POA: Diagnosis not present

## 2015-02-24 DIAGNOSIS — N3281 Overactive bladder: Secondary | ICD-10-CM | POA: Diagnosis not present

## 2015-02-24 DIAGNOSIS — Z8701 Personal history of pneumonia (recurrent): Secondary | ICD-10-CM | POA: Diagnosis not present

## 2015-02-24 DIAGNOSIS — I509 Heart failure, unspecified: Secondary | ICD-10-CM | POA: Diagnosis not present

## 2015-02-24 DIAGNOSIS — I890 Lymphedema, not elsewhere classified: Secondary | ICD-10-CM | POA: Diagnosis not present

## 2015-02-24 DIAGNOSIS — E039 Hypothyroidism, unspecified: Secondary | ICD-10-CM | POA: Diagnosis not present

## 2015-02-24 DIAGNOSIS — M1712 Unilateral primary osteoarthritis, left knee: Secondary | ICD-10-CM | POA: Diagnosis not present

## 2015-02-24 DIAGNOSIS — I130 Hypertensive heart and chronic kidney disease with heart failure and stage 1 through stage 4 chronic kidney disease, or unspecified chronic kidney disease: Secondary | ICD-10-CM | POA: Diagnosis not present

## 2015-02-24 DIAGNOSIS — N184 Chronic kidney disease, stage 4 (severe): Secondary | ICD-10-CM | POA: Diagnosis not present

## 2015-03-03 DIAGNOSIS — Z853 Personal history of malignant neoplasm of breast: Secondary | ICD-10-CM | POA: Diagnosis not present

## 2015-03-03 DIAGNOSIS — E1122 Type 2 diabetes mellitus with diabetic chronic kidney disease: Secondary | ICD-10-CM | POA: Diagnosis not present

## 2015-03-03 DIAGNOSIS — E559 Vitamin D deficiency, unspecified: Secondary | ICD-10-CM | POA: Diagnosis not present

## 2015-03-03 DIAGNOSIS — E039 Hypothyroidism, unspecified: Secondary | ICD-10-CM | POA: Diagnosis not present

## 2015-03-03 DIAGNOSIS — Z87440 Personal history of urinary (tract) infections: Secondary | ICD-10-CM | POA: Diagnosis not present

## 2015-03-03 DIAGNOSIS — I130 Hypertensive heart and chronic kidney disease with heart failure and stage 1 through stage 4 chronic kidney disease, or unspecified chronic kidney disease: Secondary | ICD-10-CM | POA: Diagnosis not present

## 2015-03-03 DIAGNOSIS — I480 Paroxysmal atrial fibrillation: Secondary | ICD-10-CM | POA: Diagnosis not present

## 2015-03-03 DIAGNOSIS — M1712 Unilateral primary osteoarthritis, left knee: Secondary | ICD-10-CM | POA: Diagnosis not present

## 2015-03-03 DIAGNOSIS — N184 Chronic kidney disease, stage 4 (severe): Secondary | ICD-10-CM | POA: Diagnosis not present

## 2015-03-03 DIAGNOSIS — Z7984 Long term (current) use of oral hypoglycemic drugs: Secondary | ICD-10-CM | POA: Diagnosis not present

## 2015-03-03 DIAGNOSIS — I890 Lymphedema, not elsewhere classified: Secondary | ICD-10-CM | POA: Diagnosis not present

## 2015-03-03 DIAGNOSIS — N3281 Overactive bladder: Secondary | ICD-10-CM | POA: Diagnosis not present

## 2015-03-03 DIAGNOSIS — I509 Heart failure, unspecified: Secondary | ICD-10-CM | POA: Diagnosis not present

## 2015-03-03 DIAGNOSIS — J441 Chronic obstructive pulmonary disease with (acute) exacerbation: Secondary | ICD-10-CM | POA: Diagnosis not present

## 2015-03-03 DIAGNOSIS — K219 Gastro-esophageal reflux disease without esophagitis: Secondary | ICD-10-CM | POA: Diagnosis not present

## 2015-03-05 ENCOUNTER — Emergency Department: Payer: Medicare Other

## 2015-03-05 ENCOUNTER — Inpatient Hospital Stay: Payer: Medicare Other

## 2015-03-05 ENCOUNTER — Inpatient Hospital Stay
Admission: EM | Admit: 2015-03-05 | Discharge: 2015-03-10 | DRG: 689 | Disposition: A | Discharge status: Home / Self Care | Payer: Medicare Other | Attending: Internal Medicine | Admitting: Internal Medicine

## 2015-03-05 ENCOUNTER — Encounter: Payer: Self-pay | Admitting: Emergency Medicine

## 2015-03-05 DIAGNOSIS — R451 Restlessness and agitation: Secondary | ICD-10-CM | POA: Diagnosis present

## 2015-03-05 DIAGNOSIS — Z5189 Encounter for other specified aftercare: Secondary | ICD-10-CM | POA: Diagnosis not present

## 2015-03-05 DIAGNOSIS — E039 Hypothyroidism, unspecified: Secondary | ICD-10-CM | POA: Diagnosis present

## 2015-03-05 DIAGNOSIS — M1712 Unilateral primary osteoarthritis, left knee: Secondary | ICD-10-CM | POA: Diagnosis not present

## 2015-03-05 DIAGNOSIS — Z9011 Acquired absence of right breast and nipple: Secondary | ICD-10-CM

## 2015-03-05 DIAGNOSIS — R1310 Dysphagia, unspecified: Secondary | ICD-10-CM | POA: Diagnosis present

## 2015-03-05 DIAGNOSIS — E870 Hyperosmolality and hypernatremia: Secondary | ICD-10-CM | POA: Diagnosis present

## 2015-03-05 DIAGNOSIS — A498 Other bacterial infections of unspecified site: Secondary | ICD-10-CM

## 2015-03-05 DIAGNOSIS — D631 Anemia in chronic kidney disease: Secondary | ICD-10-CM | POA: Diagnosis not present

## 2015-03-05 DIAGNOSIS — N10 Acute tubulo-interstitial nephritis: Secondary | ICD-10-CM | POA: Diagnosis not present

## 2015-03-05 DIAGNOSIS — I5022 Chronic systolic (congestive) heart failure: Secondary | ICD-10-CM | POA: Diagnosis not present

## 2015-03-05 DIAGNOSIS — R531 Weakness: Secondary | ICD-10-CM

## 2015-03-05 DIAGNOSIS — Z9889 Other specified postprocedural states: Secondary | ICD-10-CM | POA: Diagnosis not present

## 2015-03-05 DIAGNOSIS — I151 Hypertension secondary to other renal disorders: Secondary | ICD-10-CM | POA: Diagnosis not present

## 2015-03-05 DIAGNOSIS — J449 Chronic obstructive pulmonary disease, unspecified: Secondary | ICD-10-CM | POA: Diagnosis not present

## 2015-03-05 DIAGNOSIS — E1122 Type 2 diabetes mellitus with diabetic chronic kidney disease: Secondary | ICD-10-CM | POA: Diagnosis present

## 2015-03-05 DIAGNOSIS — N184 Chronic kidney disease, stage 4 (severe): Secondary | ICD-10-CM | POA: Diagnosis present

## 2015-03-05 DIAGNOSIS — N189 Chronic kidney disease, unspecified: Secondary | ICD-10-CM | POA: Diagnosis not present

## 2015-03-05 DIAGNOSIS — E872 Acidosis: Secondary | ICD-10-CM | POA: Diagnosis present

## 2015-03-05 DIAGNOSIS — M436 Torticollis: Secondary | ICD-10-CM | POA: Diagnosis present

## 2015-03-05 DIAGNOSIS — Z79899 Other long term (current) drug therapy: Secondary | ICD-10-CM

## 2015-03-05 DIAGNOSIS — N3090 Cystitis, unspecified without hematuria: Secondary | ICD-10-CM | POA: Diagnosis not present

## 2015-03-05 DIAGNOSIS — I130 Hypertensive heart and chronic kidney disease with heart failure and stage 1 through stage 4 chronic kidney disease, or unspecified chronic kidney disease: Secondary | ICD-10-CM | POA: Diagnosis not present

## 2015-03-05 DIAGNOSIS — F03918 Unspecified dementia, unspecified severity, with other behavioral disturbance: Secondary | ICD-10-CM

## 2015-03-05 DIAGNOSIS — J90 Pleural effusion, not elsewhere classified: Secondary | ICD-10-CM | POA: Diagnosis not present

## 2015-03-05 DIAGNOSIS — N3000 Acute cystitis without hematuria: Secondary | ICD-10-CM | POA: Diagnosis not present

## 2015-03-05 DIAGNOSIS — E860 Dehydration: Secondary | ICD-10-CM | POA: Diagnosis present

## 2015-03-05 DIAGNOSIS — M79671 Pain in right foot: Secondary | ICD-10-CM | POA: Diagnosis not present

## 2015-03-05 DIAGNOSIS — I4891 Unspecified atrial fibrillation: Secondary | ICD-10-CM | POA: Diagnosis present

## 2015-03-05 DIAGNOSIS — M549 Dorsalgia, unspecified: Secondary | ICD-10-CM | POA: Diagnosis not present

## 2015-03-05 DIAGNOSIS — D638 Anemia in other chronic diseases classified elsewhere: Secondary | ICD-10-CM

## 2015-03-05 DIAGNOSIS — M6281 Muscle weakness (generalized): Secondary | ICD-10-CM | POA: Diagnosis not present

## 2015-03-05 DIAGNOSIS — Z806 Family history of leukemia: Secondary | ICD-10-CM | POA: Diagnosis not present

## 2015-03-05 DIAGNOSIS — M7989 Other specified soft tissue disorders: Secondary | ICD-10-CM | POA: Diagnosis not present

## 2015-03-05 DIAGNOSIS — R339 Retention of urine, unspecified: Secondary | ICD-10-CM

## 2015-03-05 DIAGNOSIS — J45909 Unspecified asthma, uncomplicated: Secondary | ICD-10-CM | POA: Diagnosis present

## 2015-03-05 DIAGNOSIS — F0391 Unspecified dementia with behavioral disturbance: Secondary | ICD-10-CM | POA: Diagnosis present

## 2015-03-05 DIAGNOSIS — M179 Osteoarthritis of knee, unspecified: Secondary | ICD-10-CM | POA: Diagnosis not present

## 2015-03-05 DIAGNOSIS — Z66 Do not resuscitate: Secondary | ICD-10-CM | POA: Diagnosis present

## 2015-03-05 DIAGNOSIS — Z800 Family history of malignant neoplasm of digestive organs: Secondary | ICD-10-CM

## 2015-03-05 DIAGNOSIS — R1312 Dysphagia, oropharyngeal phase: Secondary | ICD-10-CM | POA: Diagnosis not present

## 2015-03-05 DIAGNOSIS — M25561 Pain in right knee: Secondary | ICD-10-CM | POA: Diagnosis not present

## 2015-03-05 DIAGNOSIS — I129 Hypertensive chronic kidney disease with stage 1 through stage 4 chronic kidney disease, or unspecified chronic kidney disease: Secondary | ICD-10-CM | POA: Diagnosis present

## 2015-03-05 DIAGNOSIS — B9620 Unspecified Escherichia coli [E. coli] as the cause of diseases classified elsewhere: Secondary | ICD-10-CM | POA: Diagnosis present

## 2015-03-05 DIAGNOSIS — R269 Unspecified abnormalities of gait and mobility: Secondary | ICD-10-CM | POA: Diagnosis not present

## 2015-03-05 DIAGNOSIS — M1990 Unspecified osteoarthritis, unspecified site: Secondary | ICD-10-CM | POA: Diagnosis present

## 2015-03-05 DIAGNOSIS — N390 Urinary tract infection, site not specified: Secondary | ICD-10-CM | POA: Diagnosis not present

## 2015-03-05 DIAGNOSIS — Z8042 Family history of malignant neoplasm of prostate: Secondary | ICD-10-CM

## 2015-03-05 DIAGNOSIS — Z853 Personal history of malignant neoplasm of breast: Secondary | ICD-10-CM | POA: Diagnosis not present

## 2015-03-05 DIAGNOSIS — D508 Other iron deficiency anemias: Secondary | ICD-10-CM | POA: Diagnosis not present

## 2015-03-05 DIAGNOSIS — R609 Edema, unspecified: Secondary | ICD-10-CM

## 2015-03-05 DIAGNOSIS — E1110 Type 2 diabetes mellitus with ketoacidosis without coma: Secondary | ICD-10-CM

## 2015-03-05 DIAGNOSIS — Z452 Encounter for adjustment and management of vascular access device: Secondary | ICD-10-CM

## 2015-03-05 DIAGNOSIS — R4182 Altered mental status, unspecified: Secondary | ICD-10-CM | POA: Diagnosis not present

## 2015-03-05 DIAGNOSIS — R918 Other nonspecific abnormal finding of lung field: Secondary | ICD-10-CM | POA: Diagnosis not present

## 2015-03-05 DIAGNOSIS — R52 Pain, unspecified: Secondary | ICD-10-CM

## 2015-03-05 DIAGNOSIS — N179 Acute kidney failure, unspecified: Secondary | ICD-10-CM | POA: Diagnosis not present

## 2015-03-05 DIAGNOSIS — G9341 Metabolic encephalopathy: Secondary | ICD-10-CM | POA: Diagnosis present

## 2015-03-05 LAB — CBC WITH DIFFERENTIAL/PLATELET
Basophils Absolute: 0 10*3/uL (ref 0–0.1)
Basophils Relative: 0 %
Eosinophils Absolute: 0.1 10*3/uL (ref 0–0.7)
Eosinophils Relative: 1 %
HEMATOCRIT: 28.7 % — AB (ref 35.0–47.0)
HEMOGLOBIN: 9.2 g/dL — AB (ref 12.0–16.0)
LYMPHS ABS: 1.5 10*3/uL (ref 1.0–3.6)
MCH: 30.7 pg (ref 26.0–34.0)
MCHC: 32 g/dL (ref 32.0–36.0)
MCV: 95.9 fL (ref 80.0–100.0)
MONO ABS: 0.6 10*3/uL (ref 0.2–0.9)
NEUTROS ABS: 6.3 10*3/uL (ref 1.4–6.5)
Neutrophils Relative %: 75 %
Platelets: 250 10*3/uL (ref 150–440)
RBC: 2.99 MIL/uL — ABNORMAL LOW (ref 3.80–5.20)
RDW: 16.7 % — AB (ref 11.5–14.5)
WBC: 8.6 10*3/uL (ref 3.6–11.0)

## 2015-03-05 LAB — URINALYSIS COMPLETE WITH MICROSCOPIC (ARMC ONLY)
BILIRUBIN URINE: NEGATIVE
Glucose, UA: NEGATIVE mg/dL
Hgb urine dipstick: NEGATIVE
NITRITE: NEGATIVE
PROTEIN: 100 mg/dL — AB
RBC / HPF: NONE SEEN RBC/hpf (ref 0–5)
Specific Gravity, Urine: 1.014 (ref 1.005–1.030)
pH: 6 (ref 5.0–8.0)

## 2015-03-05 LAB — COMPREHENSIVE METABOLIC PANEL
ALK PHOS: 96 U/L (ref 38–126)
ALT: 22 U/L (ref 14–54)
ANION GAP: 14 (ref 5–15)
AST: 44 U/L — ABNORMAL HIGH (ref 15–41)
Albumin: 2.1 g/dL — ABNORMAL LOW (ref 3.5–5.0)
BILIRUBIN TOTAL: 0.9 mg/dL (ref 0.3–1.2)
BUN: 64 mg/dL — ABNORMAL HIGH (ref 6–20)
CALCIUM: 8.1 mg/dL — AB (ref 8.9–10.3)
CO2: 30 mmol/L (ref 22–32)
CREATININE: 3.41 mg/dL — AB (ref 0.44–1.00)
Chloride: 100 mmol/L — ABNORMAL LOW (ref 101–111)
GFR calc Af Amer: 12 mL/min — ABNORMAL LOW (ref >60)
GFR, EST NON AFRICAN AMERICAN: 11 mL/min — AB (ref >60)
Glucose, Bld: 162 mg/dL — ABNORMAL HIGH (ref 65–99)
POTASSIUM: 3.2 mmol/L — AB (ref 3.5–5.1)
Sodium: 144 mmol/L (ref 135–145)
Total Protein: 6.2 g/dL — ABNORMAL LOW (ref 6.5–8.1)

## 2015-03-05 LAB — GLUCOSE, CAPILLARY
GLUCOSE-CAPILLARY: 115 mg/dL — AB (ref 65–99)
Glucose-Capillary: 155 mg/dL — ABNORMAL HIGH (ref 65–99)

## 2015-03-05 MED ORDER — DEXTROSE 5 % IV SOLN
1.0000 g | INTRAVENOUS | Status: DC
  Administered 2015-03-06 – 2015-03-07 (×2): 1 g via INTRAVENOUS
  Filled 2015-03-05 (×3): qty 10

## 2015-03-05 MED ORDER — SODIUM CHLORIDE 0.9 % IV BOLUS (SEPSIS)
500.0000 mL | Freq: Once | INTRAVENOUS | Status: AC
  Administered 2015-03-05: 500 mL via INTRAVENOUS

## 2015-03-05 MED ORDER — GLIPIZIDE 5 MG PO TABS
5.0000 mg | ORAL_TABLET | Freq: Two times a day (BID) | ORAL | Status: DC
  Administered 2015-03-06 – 2015-03-10 (×7): 5 mg via ORAL
  Filled 2015-03-05 (×10): qty 1

## 2015-03-05 MED ORDER — ISOSORBIDE MONONITRATE ER 30 MG PO TB24
30.0000 mg | ORAL_TABLET | Freq: Every day | ORAL | Status: DC
  Administered 2015-03-06 – 2015-03-10 (×5): 30 mg via ORAL
  Filled 2015-03-05 (×6): qty 1

## 2015-03-05 MED ORDER — INSULIN ASPART 100 UNIT/ML ~~LOC~~ SOLN
0.0000 [IU] | Freq: Three times a day (TID) | SUBCUTANEOUS | Status: DC
  Administered 2015-03-05: 2 [IU] via SUBCUTANEOUS
  Administered 2015-03-06 (×3): 1 [IU] via SUBCUTANEOUS
  Administered 2015-03-07: 2 [IU] via SUBCUTANEOUS
  Administered 2015-03-07: 3 [IU] via SUBCUTANEOUS
  Administered 2015-03-09: 2 [IU] via SUBCUTANEOUS
  Administered 2015-03-09: 5 [IU] via SUBCUTANEOUS
  Administered 2015-03-10 (×2): 2 [IU] via SUBCUTANEOUS
  Filled 2015-03-05: qty 2
  Filled 2015-03-05: qty 5
  Filled 2015-03-05: qty 1
  Filled 2015-03-05: qty 2
  Filled 2015-03-05: qty 3
  Filled 2015-03-05: qty 2
  Filled 2015-03-05: qty 1
  Filled 2015-03-05: qty 2

## 2015-03-05 MED ORDER — ONDANSETRON HCL 4 MG/2ML IJ SOLN: 4.0000 mg | Freq: Four times a day (QID) | INTRAMUSCULAR | Status: DC | PRN

## 2015-03-05 MED ORDER — DEXTROSE 5 % IV SOLN
1.0000 g | Freq: Once | INTRAVENOUS | Status: AC
  Administered 2015-03-05: 1 g via INTRAVENOUS
  Filled 2015-03-05: qty 10

## 2015-03-05 MED ORDER — CARVEDILOL 3.125 MG PO TABS
3.1250 mg | ORAL_TABLET | Freq: Two times a day (BID) | ORAL | Status: DC
Start: 2015-03-05 — End: 2015-03-10
  Administered 2015-03-06 – 2015-03-10 (×7): 3.125 mg via ORAL
  Filled 2015-03-05 (×9): qty 1

## 2015-03-05 MED ORDER — VITAMIN D (ERGOCALCIFEROL) 1.25 MG (50000 UNIT) PO CAPS: 50000.0000 [IU] | ORAL_CAPSULE | ORAL | Status: DC

## 2015-03-05 MED ORDER — POLYETHYLENE GLYCOL 3350 17 G PO PACK
17.0000 g | PACK | Freq: Every day | ORAL | Status: DC | PRN
  Filled 2015-03-05: qty 1

## 2015-03-05 MED ORDER — ACETAMINOPHEN 325 MG PO TABS
650.0000 mg | ORAL_TABLET | Freq: Once | ORAL | Status: AC
  Administered 2015-03-05: 650 mg via ORAL
  Filled 2015-03-05: qty 2

## 2015-03-05 MED ORDER — FLUOXETINE HCL 10 MG PO CAPS
10.0000 mg | ORAL_CAPSULE | Freq: Every day | ORAL | Status: DC
  Administered 2015-03-06 – 2015-03-10 (×5): 10 mg via ORAL
  Filled 2015-03-05 (×6): qty 1

## 2015-03-05 MED ORDER — OLOPATADINE HCL 0.1 % OP SOLN
1.0000 [drp] | Freq: Every day | OPHTHALMIC | Status: DC
  Administered 2015-03-07 – 2015-03-10 (×4): 1 [drp] via OPHTHALMIC
  Filled 2015-03-05: qty 5

## 2015-03-05 MED ORDER — ACETAMINOPHEN 325 MG PO TABS
650.0000 mg | ORAL_TABLET | Freq: Four times a day (QID) | ORAL | Status: DC | PRN
  Administered 2015-03-07 – 2015-03-08 (×2): 650 mg via ORAL
  Filled 2015-03-05 (×2): qty 2

## 2015-03-05 MED ORDER — POTASSIUM CHLORIDE 10 MEQ/100ML IV SOLN
10.0000 meq | INTRAVENOUS | Status: AC
  Administered 2015-03-05: 10 meq via INTRAVENOUS
  Filled 2015-03-05 (×4): qty 100

## 2015-03-05 MED ORDER — PANTOPRAZOLE SODIUM 40 MG PO TBEC
40.0000 mg | DELAYED_RELEASE_TABLET | Freq: Every day | ORAL | Status: DC
  Administered 2015-03-07 – 2015-03-10 (×4): 40 mg via ORAL
  Filled 2015-03-05 (×4): qty 1

## 2015-03-05 MED ORDER — SODIUM CHLORIDE 0.9 % IV SOLN
INTRAVENOUS | Status: DC
  Administered 2015-03-05: 18:00:00 via INTRAVENOUS

## 2015-03-05 MED ORDER — ACETAMINOPHEN 650 MG RE SUPP: 650.0000 mg | Freq: Four times a day (QID) | RECTAL | Status: DC | PRN

## 2015-03-05 MED ORDER — ALBUTEROL SULFATE (2.5 MG/3ML) 0.083% IN NEBU: 2.5000 mg | INHALATION_SOLUTION | Freq: Four times a day (QID) | RESPIRATORY_TRACT | Status: DC | PRN

## 2015-03-05 MED ORDER — LEVOTHYROXINE SODIUM 112 MCG PO TABS
112.0000 ug | ORAL_TABLET | Freq: Every day | ORAL | Status: DC
  Administered 2015-03-06 – 2015-03-10 (×5): 112 ug via ORAL
  Filled 2015-03-05 (×7): qty 1

## 2015-03-05 MED ORDER — CLOBETASOL PROPIONATE 0.05 % EX CREA
1.0000 "application " | TOPICAL_CREAM | Freq: Two times a day (BID) | CUTANEOUS | Status: DC | PRN
  Filled 2015-03-05: qty 15

## 2015-03-05 MED ORDER — DOCUSATE SODIUM 100 MG PO CAPS
100.0000 mg | ORAL_CAPSULE | Freq: Two times a day (BID) | ORAL | Status: DC
  Administered 2015-03-06 – 2015-03-10 (×8): 100 mg via ORAL
  Filled 2015-03-05 (×8): qty 1

## 2015-03-05 MED ORDER — ONDANSETRON HCL 4 MG PO TABS: 4.0000 mg | ORAL_TABLET | Freq: Four times a day (QID) | ORAL | Status: DC | PRN

## 2015-03-05 MED ORDER — OXYBUTYNIN CHLORIDE ER 5 MG PO TB24
10.0000 mg | ORAL_TABLET | Freq: Every day | ORAL | Status: DC
  Administered 2015-03-06: 10 mg via ORAL
  Filled 2015-03-05 (×2): qty 2

## 2015-03-05 MED ORDER — SODIUM BICARBONATE 650 MG PO TABS
650.0000 mg | ORAL_TABLET | Freq: Two times a day (BID) | ORAL | Status: DC
  Administered 2015-03-06 – 2015-03-10 (×9): 650 mg via ORAL
  Filled 2015-03-05 (×13): qty 1

## 2015-03-05 MED ORDER — LORAZEPAM 2 MG/ML IJ SOLN
0.5000 mg | INTRAMUSCULAR | Status: DC | PRN
  Administered 2015-03-05: 0.5 mg via INTRAVENOUS
  Administered 2015-03-06: 2 mg via INTRAVENOUS
  Filled 2015-03-05 (×3): qty 1

## 2015-03-05 MED ORDER — ADULT MULTIVITAMIN W/MINERALS CH
1.0000 | ORAL_TABLET | Freq: Every day | ORAL | Status: DC
  Administered 2015-03-07 – 2015-03-10 (×4): 1 via ORAL
  Filled 2015-03-05 (×5): qty 1

## 2015-03-05 MED ORDER — DILTIAZEM HCL ER COATED BEADS 120 MG PO CP24
120.0000 mg | ORAL_CAPSULE | Freq: Every day | ORAL | Status: DC
  Administered 2015-03-06 – 2015-03-10 (×4): 120 mg via ORAL
  Filled 2015-03-05 (×6): qty 1

## 2015-03-05 MED ORDER — CETIRIZINE HCL 10 MG PO TABS
10.0000 mg | ORAL_TABLET | Freq: Every day | ORAL | Status: DC
  Administered 2015-03-07 – 2015-03-10 (×4): 10 mg via ORAL
  Filled 2015-03-05 (×11): qty 1

## 2015-03-05 MED ORDER — LORAZEPAM 0.5 MG PO TABS: 0.5000 mg | ORAL_TABLET | Freq: Two times a day (BID) | ORAL | Status: DC | PRN

## 2015-03-05 MED ORDER — HYDRALAZINE HCL 25 MG PO TABS
25.0000 mg | ORAL_TABLET | Freq: Three times a day (TID) | ORAL | Status: DC
  Administered 2015-03-06 – 2015-03-10 (×8): 25 mg via ORAL
  Filled 2015-03-05 (×16): qty 1

## 2015-03-05 MED ORDER — HEPARIN SODIUM (PORCINE) 5000 UNIT/ML IJ SOLN
5000.0000 [IU] | Freq: Three times a day (TID) | INTRAMUSCULAR | Status: DC
  Administered 2015-03-05 – 2015-03-10 (×15): 5000 [IU] via SUBCUTANEOUS
  Filled 2015-03-05 (×16): qty 1

## 2015-03-05 MED ORDER — INSULIN ASPART 100 UNIT/ML ~~LOC~~ SOLN
0.0000 [IU] | Freq: Every day | SUBCUTANEOUS | Status: DC
  Administered 2015-03-07: 2 [IU] via SUBCUTANEOUS
  Filled 2015-03-05: qty 2

## 2015-03-05 NOTE — Progress Notes (Signed)
Pt agitated. Notified Dr Tressia Miners for IV ativan instead of po. Order will be changed by MD

## 2015-03-05 NOTE — ED Notes (Signed)
Report called to [facility] on 1A, room is not clean at this time.

## 2015-03-05 NOTE — Progress Notes (Signed)
PT WITH SIGNIFICANT EDEMA RUE. DR Tressia Miners REPORTS PT MAY HAVE LYMPHEDEMA BUT WILL ULTRASOUND RUE  ALSO

## 2015-03-05 NOTE — ED Provider Notes (Addendum)
[facility] Emergency Department Provider Note  ____________________________________________  Time seen: Approximately 11:44 AM  I have reviewed the triage vital signs and the nursing notes.   HISTORY  Chief Complaint Back Pain and Difficulty Walking  Caveat - History of present illness and review of systems is limited due to the patient's dementia. Information is partially obtained from the patient as well as EMS on their arrival.  HPI Paige James is a 82 y.o. female that she dementia, CKD, diabetes, hypertension, CHF, atrial fibrillation, not anticoagulated, who presents from her family care home for generalized weakness, unable to get out of bed today. He usually walks with a walker but has not been able to do that today. Staff at her care home thought that she is having back pain however the patient denies any pain in her back. She is complaining of pain in her feet, worse in the right foot. According to EMS, she smelled of urine on their arrival to the family care home. The patient denies any chest pain or abdominal pain.   Past Medical History  Diagnosis Date  . Renal disorder     CKD  . Diabetes mellitus without complication ([place])   . Thyroid disease     Hypothyroidism  . Senile dementia   . Hypertension   . Atopic dermatitis   . Osteoarthritis   . Depressive disorder   . Atrial fibrillation ([place])   . Breast cancer ([place])   . Congestive heart failure (CHF) (HCC)     Ejection fraction of 45-50%    Patient Active Problem List   Diagnosis Date Noted  . Knee pain 12/21/2014  . Encephalopathy, metabolic 64/68/0321  . Acute delirium 12/17/2014  . Acute respiratory failure with hypoxia ([place]) 12/17/2014  . COPD exacerbation ([place]) 12/17/2014  . Acute bronchitis 12/17/2014  . Pleural effusion 12/17/2014  . Chronic kidney disease (CKD), stage IV (severe) ([place])   . Acute pulmonary edema (HCC)   . Acute on chronic combined systolic and diastolic CHF  (congestive heart failure) ([place])   . Hypoxia   . Persistent atrial fibrillation ([place])   . SOB (shortness of breath)   . Wheezing   . Atrial fibrillation with RVR ([place])   . CHF (congestive heart failure) ([place]) 12/08/2014    Past Surgical History  Procedure Laterality Date  . Cataract surgery    . Mastectomy      right sided mastectomy  . Hip surgery    . Right knee surgery    . Left knee surgery      Current Outpatient Rx  Name  Route  Sig  Dispense  Refill  . albuterol (PROVENTIL) (2.5 MG/3ML) 0.083% nebulizer solution   Nebulization   Take 3 mLs (2.5 mg total) by nebulization every 6 (six) hours as needed for wheezing or shortness of breath. Patient not taking: Reported on 12/21/2014   75 mL   12   . amLODipine (NORVASC) 5 MG tablet   Oral   Take 5 mg by mouth daily.         . carvedilol (COREG) 3.125 MG tablet   Oral   Take 3.125 mg by mouth 2 (two) times daily with a meal.         . cefUROXime (CEFTIN) 250 MG tablet   Oral   Take 1 tablet (250 mg total) by mouth 2 (two) times daily with a meal.   12 tablet   0   . clobetasol cream (TEMOVATE) 0.05 %  Topical   Apply 1 application topically 2 (two) times daily as needed (for bilateral lower legs and feet).         Marland Kitchen diltiazem (CARDIZEM CD) 120 MG 24 hr capsule   Oral   Take 1 capsule (120 mg total) by mouth daily. Patient not taking: Reported on 12/21/2014   30 capsule   6   . FLUoxetine (PROZAC) 10 MG capsule   Oral   Take 10 mg by mouth daily.         Marland Kitchen glipiZIDE (GLUCOTROL) 5 MG tablet   Oral   Take 5 mg by mouth 2 (two) times daily with a meal.         . hydrALAZINE (APRESOLINE) 25 MG tablet   Oral   Take 1 tablet (25 mg total) by mouth every 6 (six) hours. Patient not taking: Reported on 12/21/2014   120 tablet   6   . isosorbide mononitrate (IMDUR) 30 MG 24 hr tablet   Oral   Take 1 tablet (30 mg total) by mouth daily. Patient not taking: Reported on 12/21/2014   30 tablet    6   . levocetirizine (XYZAL) 5 MG tablet   Oral   Take 5 mg by mouth daily.         Marland Kitchen levothyroxine (SYNTHROID, LEVOTHROID) 112 MCG tablet   Oral   Take 1 tablet (112 mcg total) by mouth daily before breakfast. Patient not taking: Reported on 12/21/2014   30 tablet   6   . Multiple Vitamin (THEREMS PO)   Oral   Take 1 tablet by mouth daily.         . Olopatadine HCl (PAZEO) 0.7 % SOLN   Both Eyes   Place 1 drop into both eyes daily.          Marland Kitchen oxybutynin (DITROPAN-XL) 10 MG 24 hr tablet   Oral   Take 10 mg by mouth daily.         Marland Kitchen oxyCODONE (OXY IR/ROXICODONE) 5 MG immediate release tablet   Oral   Take 1 tablet (5 mg total) by mouth every 6 (six) hours as needed for moderate pain.   20 tablet   0   . pantoprazole (PROTONIX) 40 MG tablet   Oral   Take 40 mg by mouth daily.         . sodium bicarbonate 650 MG tablet   Oral   Take 650 mg by mouth 2 (two) times daily.         . Vitamin D, Ergocalciferol, (DRISDOL) 50000 UNITS CAPS capsule   Oral   Take 50,000 Units by mouth every 7 (seven) days. Pt takes on Wednesday.           Allergies Review of patient's allergies indicates no known allergies.  Family History  Problem Relation Age of Onset  . Cancer - Prostate Brother   . Colon cancer Daughter   . Leukemia Daughter     Social History Social History  Substance Use Topics  . Smoking status: Never Smoker   . Smokeless tobacco: None  . Alcohol Use: No    Review of Systems  Cardiovascular: Denies chest pain. Gastrointestinal: No abdominal pain.    Caveat - History of present illness and review of systems is limited due to the patient's dementia. Information is partially obtained from the patient as well as EMS on their arrival. ____________________________________________   PHYSICAL EXAM:  VITAL SIGNS: ED Triage Vitals  Enc Vitals Group  BP 03/05/15 1111 105/65 mmHg     Pulse Rate 03/05/15 1111 76     Resp 03/05/15 1111 18      Temp 03/05/15 1111 98.4 F (36.9 C)     Temp Source 03/05/15 1111 Oral     SpO2 03/05/15 1111 100 %     Weight 03/05/15 1111 172 lb (78.019 kg)     Height 03/05/15 1111 '5\' 4"'$  (1.626 m)     Head Cir --      Peak Flow --      Pain Score 03/05/15 1113 0     Pain Loc --      Pain Edu? --      Excl. in [place]? --     Constitutional: Alert and oriented to self and place but not to year. She appears comfortable at rest but cries out in pain with any palpation of the right foot. She does appear demented and inconsistently follows commands. Eyes: Conjunctivae are normal. PERRL. EOMI. Head: Atraumatic. Nose: No congestion/rhinnorhea. Mouth/Throat: Mucous membranes are moist.  Oropharynx non-erythematous. Neck: No stridor.  Appears supple without meningismus. Cardiovascular: Normal rate, regular rhythm. Grossly normal heart sounds.  Good peripheral circulation. Respiratory: Normal respiratory effort.  No retractions. Lungs CTAB. Gastrointestinal: Soft and nontender. No distention. No CVA tenderness. Genitourinary: deferred Musculoskeletal: Tenderness throughout the right foot with no appear she will bony deformity. No midline T or L-spine tenderness to palpation. Neurologic: Appears to move all extremity spontaneously and equally but does not follow commands or per to spit with formal neurological testing. Skin:  Skin is warm, dry and intact. No rash noted.   ____________________________________________   LABS (all labs ordered are listed, but only abnormal results are displayed)  Labs Reviewed  CBC WITH DIFFERENTIAL/PLATELET - Abnormal; Notable for the following:    RBC 2.99 (*)    Hemoglobin 9.2 (*)    HCT 28.7 (*)    RDW 16.7 (*)    All other components within normal limits  COMPREHENSIVE METABOLIC PANEL - Abnormal; Notable for the following:    Potassium 3.2 (*)    Chloride 100 (*)    Glucose, Bld 162 (*)    BUN 64 (*)    Creatinine, Ser 3.41 (*)    Calcium 8.1 (*)    Total  Protein 6.2 (*)    Albumin 2.1 (*)    AST 44 (*)    GFR calc non Af Amer 11 (*)    GFR calc Af Amer 12 (*)    All other components within normal limits  URINALYSIS COMPLETEWITH MICROSCOPIC ([facility] ONLY) - Abnormal; Notable for the following:    Color, Urine AMBER (*)    APPearance TURBID (*)    Ketones, ur TRACE (*)    Protein, ur 100 (*)    Leukocytes, UA 3+ (*)    Bacteria, UA FEW (*)    Squamous Epithelial / LPF 0-5 (*)    All other components within normal limits   ____________________________________________  EKG  none ____________________________________________  RADIOLOGY  CXR IMPRESSION: Small bilateral layering pleural effusions with underlying opacities favored to represent atelectasis.    Xray right foot  IMPRESSION: Negative. ____________________________________________   PROCEDURES  Procedure(s) performed: None  Critical Care performed: No  ____________________________________________   INITIAL IMPRESSION / ASSESSMENT AND PLAN / ED COURSE  Pertinent labs & imaging results that were available during my care of the patient were reviewed by me and considered in my medical decision making (see chart for details).  Paige  Viona Gilmore James is a 82 y.o. female that she dementia, CKD, diabetes, hypertension, CHF, atrial fibrillation, not anticoagulated, who presents from her family care home for generalized weakness perceived pain. On exam, she is nontoxic appearing and generally does not appear to be in any acute distress. She doesn't have any tenderness throughout her spine however she does have significant tenderness in the right foot. He does smell heavily of urine. Plan for screening labs, urinalysis, plain films of the right foot as well as chest x-ray. Reassess for disposition.  ----------------------------------------- 1:38 PM on 03/05/2015 ----------------------------------------- Labs reviewed. CBC notable for chronic anemia with Hemoccult (9.2. AP  notable for acute on chronic renal failure with creatinine 3.41. Potassium is normal at 3.2. Plain films of the right foot are unremarkable. When she was last admitted for UTI and foot pain, her pain was thought to be secondary to arthritis versus pseudogout. Chest x-ray with atelectasis. Urinalysis is concerning for urinary tract infection, we'll give ceftriaxone. Case discussed with hospitalist for admission at this time.  ____________________________________________   FINAL CLINICAL IMPRESSION(S) / ED DIAGNOSES  Final diagnoses:  Weakness  Foot pain, right  UTI (lower urinary tract infection)      Joanne Gavel, MD 03/05/15 1339  Joanne Gavel, MD 03/05/15 1340

## 2015-03-05 NOTE — ED Notes (Signed)
Admitting MD at bedside at this time.

## 2015-03-05 NOTE — H&P (Signed)
[place] [facility] [place] NAME: Paige James    MR#:  [id number]  DATE OF BIRTH:  1921-01-09  DATE OF ADMISSION:  03/05/2015  PRIMARY CARE PHYSICIAN: Dr. Clayborn Bigness  REQUESTING/REFERRING PHYSICIAN: Dr. Loura Pardon  CHIEF COMPLAINT:   Chief Complaint  Patient presents with  . Back Pain  . Difficulty Walking    HISTORY OF PRESENT ILLNESS:  Paige James  is a 82 y.o. female with a known history of dementia, systolic CHF with EF of 47%, CK D stage IV, atrial fibrillation not on anticoagulation, osteoarthritis who is from my family care home was brought in secondary to worsening confusion. Patient is a very poor historian due to her dementia, so most of the history is obtained from ER physician. He shouldn't usually ambulates with the help of a walker, but was very weak and confused and was unable to get out of bed today. EMS was called, she was complaining of pain in her feet. She also smells of urine and was incontinent. No fevers reported. No elevation in white count. urine analysis significant for UTI. No nausea or vomiting.  PAST MEDICAL HISTORY:   Past Medical History  Diagnosis Date  . Renal disorder     CKD stage 4  . Diabetes mellitus without complication ([place])   . Thyroid disease     Hypothyroidism  . Senile dementia   . Hypertension   . Atopic dermatitis   . Osteoarthritis   . Depressive disorder   . Atrial fibrillation ([place])   . Breast cancer [facility])     s/p right mastectomy  . Congestive heart failure (CHF) (HCC)     Ejection fraction of 45-50%    PAST SURGICAL HISTORY:   Past Surgical History  Procedure Laterality Date  . Cataract surgery    . Mastectomy      right sided mastectomy  . Hip surgery    . Right knee surgery    . Left knee surgery      SOCIAL HISTORY:   Social History  Substance Use Topics  . Smoking status: Never Smoker   . Smokeless tobacco: Not on file  . Alcohol Use: No    FAMILY  HISTORY:   Family History  Problem Relation Age of Onset  . Cancer - Prostate Brother   . Colon cancer Daughter   . Leukemia Daughter     DRUG ALLERGIES:  No Known Allergies  REVIEW OF SYSTEMS:   Review of Systems  Unable to perform ROS: dementia    MEDICATIONS AT HOME:   Prior to Admission medications   Medication Sig Start Date End Date Taking? Authorizing Provider  carvedilol (COREG) 3.125 MG tablet Take 3.125 mg by mouth 2 (two) times daily.    Yes Historical Provider, MD  clobetasol cream (TEMOVATE) 6.54 % Apply 1 application topically 2 (two) times daily as needed (for irritation). Pt applies to legs and feet.   Yes Historical Provider, MD  diltiazem (CARDIZEM CD) 120 MG 24 hr capsule Take 1 capsule (120 mg total) by mouth daily. 12/18/14  Yes Theodoro Grist, MD  FLUoxetine (PROZAC) 10 MG capsule Take 10 mg by mouth daily.   Yes Historical Provider, MD  glipiZIDE (GLUCOTROL) 5 MG tablet Take 5 mg by mouth 2 (two) times daily with a meal.   Yes Historical Provider, MD  hydrALAZINE (APRESOLINE) 25 MG tablet Take 25 mg by mouth 3 (three) times daily.   Yes Historical Provider, MD  isosorbide  mononitrate (IMDUR) 30 MG 24 hr tablet Take 1 tablet (30 mg total) by mouth daily. 12/18/14  Yes Theodoro Grist, MD  levocetirizine (XYZAL) 5 MG tablet Take 5 mg by mouth daily.   Yes Historical Provider, MD  levothyroxine (SYNTHROID, LEVOTHROID) 112 MCG tablet Take 112 mcg by mouth daily.   Yes Historical Provider, MD  LORazepam (ATIVAN) 0.5 MG tablet Take 0.5 mg by mouth 2 (two) times daily as needed (for agitation).   Yes Historical Provider, MD  Multiple Vitamin (MULTIVITAMIN WITH MINERALS) TABS tablet Take 1 tablet by mouth daily.   Yes Historical Provider, MD  Olopatadine HCl (PAZEO) 0.7 % SOLN Place 1 drop into both eyes daily.   Yes Historical Provider, MD  oxybutynin (DITROPAN-XL) 10 MG 24 hr tablet Take 10 mg by mouth daily.   Yes Historical Provider, MD  pantoprazole (PROTONIX) 40  MG tablet Take 40 mg by mouth daily.   Yes Historical Provider, MD  sodium bicarbonate 650 MG tablet Take 650 mg by mouth 2 (two) times daily.   Yes Historical Provider, MD  Vitamin D, Ergocalciferol, (DRISDOL) 50000 UNITS CAPS capsule Take 50,000 Units by mouth every 7 (seven) days. Pt takes on Wednesday.   Yes Historical Provider, MD  albuterol (PROVENTIL) (2.5 MG/3ML) 0.083% nebulizer solution Take 3 mLs (2.5 mg total) by nebulization every 6 (six) hours as needed for wheezing or shortness of breath. Patient not taking: Reported on 12/21/2014 12/18/14   Theodoro Grist, MD  cefUROXime (CEFTIN) 250 MG tablet Take 1 tablet (250 mg total) by mouth 2 (two) times daily with a meal. Patient not taking: Reported on 03/05/2015 12/22/14   Vaughan Basta, MD  oxyCODONE (OXY IR/ROXICODONE) 5 MG immediate release tablet Take 1 tablet (5 mg total) by mouth every 6 (six) hours as needed for moderate pain. Patient not taking: Reported on 03/05/2015 12/22/14   Vaughan Basta, MD      VITAL SIGNS:  Blood pressure 111/61, pulse 77, temperature 97.6 F (36.4 C), temperature source Oral, resp. rate 16, height '5\' 4"'$  (1.626 m), weight 78.019 kg (172 lb), SpO2 98 %.  PHYSICAL EXAMINATION:   Physical Exam  GENERAL:  82 y.o.-year-old patient lying in the bed with no acute distress. Occasionally screaming. EYES: Pupils equal, round, reactive to light and accommodation. No scleral icterus. Extraocular muscles intact.  HEENT: Head atraumatic, normocephalic. Oropharynx and nasopharynx clear. No teeth. NECK:  Supple, no jugular venous distention. No thyroid enlargement, no tenderness.  LUNGS: Normal breath sounds bilaterally, no wheezing, rales,rhonchi or crepitation. No use of accessory muscles of respiration. Decreased bibasilar breath sounds Status post right mastectomy CARDIOVASCULAR: S1, S2 normal. No  rubs, or gallops. 3/6 systolic murmur is present ABDOMEN: Soft, nontender, nondistended. Bowel sounds  present. No organomegaly or mass.  EXTREMITIES: No pedal edema, cyanosis, or clubbing. Both knees are slightly swollen. No tenderness or erythema noted. NEUROLOGIC: Cranial nerves II through XII are intact. Able to move all extremities in bed. Sensation seems to be intact. Not cooperating for complete neuro exam. Following some simple commands.  PSYCHIATRIC: The patient is alert and oriented to self.  SKIN: No obvious rash, lesion, or ulcer. Dry skin on the legs especially feet.  LABORATORY PANEL:   CBC  Recent Labs Lab 03/05/15 1137  WBC 8.6  HGB 9.2*  HCT 28.7*  PLT 250   ------------------------------------------------------------------------------------------------------------------  Chemistries   Recent Labs Lab 03/05/15 1137  NA 144  K 3.2*  CL 100*  CO2 30  GLUCOSE 162*  BUN 64*  CREATININE 3.41*  CALCIUM 8.1*  AST 44*  ALT 22  ALKPHOS 96  BILITOT 0.9   ------------------------------------------------------------------------------------------------------------------  Cardiac Enzymes No results for input(s): TROPONINI in the last 168 hours. ------------------------------------------------------------------------------------------------------------------  RADIOLOGY:  Dg Chest 2 View  03/05/2015  CLINICAL DATA:  Patient with altered mental status. EXAM: CHEST  2 VIEW COMPARISON:  Chest radiograph 12/21/2014. FINDINGS: Stable enlarged cardiac and mediastinal contours. Interval development of heterogeneous opacities within the lung bases bilaterally. Re- demonstrated nodular pleural parenchymal thickening within the right lung apex. Small bilateral pleural effusions. No pneumothorax. Thoracic spine degenerative changes. IMPRESSION: Small bilateral layering pleural effusions with underlying opacities favored to represent atelectasis. Electronically Signed   By: Lovey Newcomer M.D.   On: 03/05/2015 12:42   Dg Foot Complete Right  03/05/2015  CLINICAL DATA:  Generalized  weakness. EXAM: RIGHT FOOT COMPLETE - 3+ VIEW COMPARISON:  None. FINDINGS: There is no evidence of fracture or dislocation. There is no evidence of arthropathy or other focal bone abnormality. Soft tissues are unremarkable. IMPRESSION: Negative. Electronically Signed   By: Franchot Gallo M.D.   On: 03/05/2015 12:42    EKG:   Orders placed or performed during the hospital encounter of 12/08/14  . EKG 12-Lead  . EKG 12-Lead  . ED EKG  . ED EKG  . EKG    IMPRESSION AND PLAN:   Paige James  is a 82 y.o. female with a known history of dementia, systolic CHF with EF of 58%, CK D stage IV, atrial fibrillation not on anticoagulation, osteoarthritis who is from my family care home was brought in secondary to worsening confusion.  #1 acute cystitis-follow up blood and urine cultures -Empirically on Rocephin.  #2  ARF on CKD stage 4- baseline cr around 2.3 - Likely prerenal, gentle hydration - monitor, avoid nephrotoxins  #3 Metabolic encephalopathy- likely from cystitis -dementia at baseline, oriented to self and screaming now - monitor  #4 diabetes mellitus-continue glipizide. Also started on sliding scale insulin.  #5 hypertension-continue home medications. Patient on hydralazine, Coreg and Cardizem.  #6 atrial fibrillation-rate controlled, on Coreg and Cardizem. -Not on anticoagulation likely due to her age and also risk of falls.  #7 congestive heart failure-well compensated. Systolic dysfunction. Known ejection fraction of 45%. -Chest x-ray with no pulmonary edema, minimal pleural effusions. -Not on Lasix. Monitor while receiving gentle hydration.  #8 DVT prophylaxis-on subcutaneous heparin.  Physical therapy has been consulted.  Patient is a Psychologist, clinical of the state. Guardianship paperwork in the chart.    All the records are reviewed and case discussed with ED provider. Management plans discussed with the patient, family and they are in agreement.  CODE STATUS: Full  Code  TOTAL TIME TAKING CARE OF THIS PATIENT: 50 minutes.    Gladstone Lighter M.D on 03/05/2015 at 2:15 PM  Between 7am to 6pm - Pager - [phone]  After 6pm go to [email or web address] - password [email or web address] [place] [facility]  Office  [phone]  CC: Primary care physician; Pcp Not In System

## 2015-03-05 NOTE — Progress Notes (Signed)
Patient is very agitated, swinging and scraching at the staff even after Ativan medication. Refused to take her oral medications. Dr. Jannifer Franklin notified and he indicated the RN should hold the oral medications for tonight. BP=173/71 and HR=191, the numbers are not accurate because the patient was agitated and swinging at people. Dr Jannifer Franklin aware of the vital signs. Refused to let the staff take her temp. No acute distress or any s/s of pain. Will continue to monitor.

## 2015-03-05 NOTE — ED Notes (Signed)
Patient presents to the ED via EMS from [facility].  Per EMS, care givers told EMS that patient was having back pain, usually is able to walk with a walker but beginning yesterday reports being unable to get out of bed.  Patient smells of urine and EMS reports patient has a wet incontinence pad that the facility stated they did not change because patient would not allow them to change it.  Patient told EMS that her feet hurt and that they never asked to change her incontinence pad.  Patient is alert and oriented to self and place but not time.  Patient has early stages of dementia per EMS.  Patient is in no obvious distress at this time but patient called out in pain when EMS transferred her from the EMS stretcher to the ED stretcher.

## 2015-03-05 NOTE — Consult Note (Signed)
ANTIBIOTIC CONSULT NOTE - INITIAL  Pharmacy Consult for ceftriaxone  Indication: UTI  No Known Allergies  Patient Measurements: Height: '5\' 4"'$  (162.6 cm) Weight: 172 lb (78.019 kg) IBW/kg (Calculated) : 54.7  Vital Signs: Temp: 97.6 F (36.4 C) (01/06 1640) Temp Source: Oral (01/06 1640) BP: 116/72 mmHg (01/06 1640) Pulse Rate: 75 (01/06 1640) Intake/Output from previous day:   Intake/Output from this shift:    Labs:  Recent Labs  03/05/15 1137  WBC 8.6  HGB 9.2*  PLT 250  CREATININE 3.41*   Estimated Creatinine Clearance: 10.6 mL/min (by C-G formula based on Cr of 3.41). No results for input(s): VANCOTROUGH, VANCOPEAK, VANCORANDOM, GENTTROUGH, GENTPEAK, GENTRANDOM, TOBRATROUGH, TOBRAPEAK, TOBRARND, AMIKACINPEAK, AMIKACINTROU, AMIKACIN in the last 72 hours.   Microbiology: No results found for this or any previous visit (from the past 720 hour(s)).  Medical History: Past Medical History  Diagnosis Date  . Renal disorder     CKD stage 4  . Diabetes mellitus without complication ([place])   . Thyroid disease     Hypothyroidism  . Senile dementia   . Hypertension   . Atopic dermatitis   . Osteoarthritis   . Depressive disorder   . Atrial fibrillation ([place])   . Breast cancer [facility])     s/p right mastectomy  . Congestive heart failure (CHF) (HCC)     Ejection fraction of 45-50%    Medications:  Scheduled:  . carvedilol  3.125 mg Oral BID  . [START ON 03/06/2015] cefTRIAXone (ROCEPHIN)  IV  1 g Intravenous Q24H  . cetirizine  10 mg Oral Daily  . diltiazem  120 mg Oral Daily  . docusate sodium  100 mg Oral BID  . FLUoxetine  10 mg Oral Daily  . glipiZIDE  5 mg Oral BID WC  . heparin  5,000 Units Subcutaneous 3 times per day  . hydrALAZINE  25 mg Oral TID  . insulin aspart  0-5 Units Subcutaneous QHS  . insulin aspart  0-9 Units Subcutaneous TID WC  . isosorbide mononitrate  30 mg Oral Daily  . [START ON 03/06/2015] levothyroxine  112 mcg Oral QAC breakfast  .  multivitamin with minerals  1 tablet Oral Daily  . olopatadine  1 drop Both Eyes Daily  . oxybutynin  10 mg Oral Daily  . [START ON 03/06/2015] pantoprazole  40 mg Oral QAC breakfast  . potassium chloride  10 mEq Intravenous Q1 Hr x 4  . sodium bicarbonate  650 mg Oral BID  . Vitamin D (Ergocalciferol)  50,000 Units Oral Q7 days   Assessment: CA is a 82 yo female admitted with suspected UTI. Pharmacy consulted to dose ceftriaxone for this patient.  Plan:  Initiate ceftriaxone 1g IV q24hrs. Pharmacy will continue to monitor for signs of infection resolution and urine culture results.   Vena Rua 03/05/2015,5:49 PM

## 2015-03-05 NOTE — ED Notes (Signed)
Attempted to call report, RN to call back shortly.  Patient resting quietly at this time.

## 2015-03-06 ENCOUNTER — Inpatient Hospital Stay: Payer: Medicare Other

## 2015-03-06 DIAGNOSIS — E870 Hyperosmolality and hypernatremia: Secondary | ICD-10-CM | POA: Insufficient documentation

## 2015-03-06 DIAGNOSIS — N189 Chronic kidney disease, unspecified: Secondary | ICD-10-CM

## 2015-03-06 DIAGNOSIS — N179 Acute kidney failure, unspecified: Secondary | ICD-10-CM

## 2015-03-06 DIAGNOSIS — N390 Urinary tract infection, site not specified: Secondary | ICD-10-CM

## 2015-03-06 DIAGNOSIS — R531 Weakness: Secondary | ICD-10-CM

## 2015-03-06 LAB — CBC
HCT: 25.6 % — ABNORMAL LOW (ref 35.0–47.0)
Hemoglobin: 8.2 g/dL — ABNORMAL LOW (ref 12.0–16.0)
MCH: 31.1 pg (ref 26.0–34.0)
MCHC: 32.1 g/dL (ref 32.0–36.0)
MCV: 97 fL (ref 80.0–100.0)
PLATELETS: 252 10*3/uL (ref 150–440)
RBC: 2.64 MIL/uL — AB (ref 3.80–5.20)
RDW: 17 % — AB (ref 11.5–14.5)
WBC: 7.9 10*3/uL (ref 3.6–11.0)

## 2015-03-06 LAB — GLUCOSE, CAPILLARY
GLUCOSE-CAPILLARY: 134 mg/dL — AB (ref 65–99)
Glucose-Capillary: 129 mg/dL — ABNORMAL HIGH (ref 65–99)
Glucose-Capillary: 127 mg/dL — ABNORMAL HIGH (ref 65–99)
Glucose-Capillary: 136 mg/dL — ABNORMAL HIGH (ref 65–99)

## 2015-03-06 LAB — BASIC METABOLIC PANEL
Anion gap: 12 (ref 5–15)
BUN: 66 mg/dL — AB (ref 6–20)
CALCIUM: 7.8 mg/dL — AB (ref 8.9–10.3)
CHLORIDE: 104 mmol/L (ref 101–111)
CO2: 30 mmol/L (ref 22–32)
CREATININE: 3.15 mg/dL — AB (ref 0.44–1.00)
GFR calc non Af Amer: 12 mL/min — ABNORMAL LOW (ref >60)
GFR, EST AFRICAN AMERICAN: 14 mL/min — AB (ref >60)
Glucose, Bld: 137 mg/dL — ABNORMAL HIGH (ref 65–99)
Potassium: 3.7 mmol/L (ref 3.5–5.1)
Sodium: 146 mmol/L — ABNORMAL HIGH (ref 135–145)

## 2015-03-06 MED ORDER — OXYBUTYNIN CHLORIDE 5 MG PO TABS
5.0000 mg | ORAL_TABLET | Freq: Two times a day (BID) | ORAL | Status: DC
  Administered 2015-03-06 – 2015-03-10 (×8): 5 mg via ORAL
  Filled 2015-03-06 (×8): qty 1

## 2015-03-06 MED ORDER — MORPHINE SULFATE (PF) 2 MG/ML IV SOLN
2.0000 mg | Freq: Once | INTRAVENOUS | Status: AC
  Administered 2015-03-06: 2 mg via SUBCUTANEOUS

## 2015-03-06 MED ORDER — OXYBUTYNIN CHLORIDE 5 MG/5ML PO SYRP
5.0000 mg | ORAL_SOLUTION | Freq: Two times a day (BID) | ORAL | Status: DC
  Filled 2015-03-06 (×2): qty 5

## 2015-03-06 MED ORDER — MORPHINE SULFATE (PF) 2 MG/ML IV SOLN: 2.0000 mg | Freq: Once | INTRAVENOUS | Status: DC

## 2015-03-06 MED ORDER — MORPHINE SULFATE (PF) 4 MG/ML IV SOLN
INTRAVENOUS | Status: AC
  Administered 2015-03-06: 2 mg
  Filled 2015-03-06: qty 1

## 2015-03-06 MED ORDER — MORPHINE SULFATE (PF) 4 MG/ML IV SOLN: 4.0000 mg | Freq: Once | INTRAVENOUS | Status: DC

## 2015-03-06 MED ORDER — DEXTROSE 5 % IV SOLN
INTRAVENOUS | Status: DC
Start: 2015-03-06 — End: 2015-03-07
  Administered 2015-03-06 – 2015-03-07 (×2): via INTRAVENOUS

## 2015-03-06 NOTE — Progress Notes (Signed)
Nutrition Follow-up   INTERVENTION:   Medical Food Supplement Therapy: recommend addition of Magic cup BID,Mighty shakes at Breakfast Meals and Snacks: Cater to patient preferences; changed from Soft to Dysphagia III, SLP eval pending Feeding Assistance: assistance at meal times as tolerated by pt  NUTRITION DIAGNOSIS:   Inadequate oral intake related to lethargy/confusion, acute illness as evidenced by meal completion < 25%.  GOAL:   Patient will meet greater than or equal to 90% of their needs  MONITOR:    (Energy Intake, Anthropometrics, Digestive System, Electrolyte/Renal Profile, Cognition)  REASON FOR ASSESSMENT:   Malnutrition Screening Tool   ASSESSMENT:    Pt admitted with acute cystitis,acute on CKD IV,metabolic encephalopathy with dementia at baseline; noted pt to be agitated, swinging and scratching staff even post ativan, refusing meds and some care  Past Medical History  Diagnosis Date  . Renal disorder     CKD stage 4  . Diabetes mellitus without complication ([place])   . Thyroid disease     Hypothyroidism  . Senile dementia   . Hypertension   . Atopic dermatitis   . Osteoarthritis   . Depressive disorder   . Atrial fibrillation ([place])   . Breast cancer [facility])     s/p right mastectomy  . Congestive heart failure (CHF) (HCC)     Ejection fraction of 45-50%     Diet Order:  Diet heart healthy/carb modified Room service appropriate?: Yes; Fluid consistency:: Thin ; noted SLP eval pending  Energy Intake: recorded po intake 0%  Food and Nutrition Related History: unable to assess  Skin: no pressure ulcers present  Electrolyte and Renal Profile:  Recent Labs Lab 03/05/15 1137 03/06/15 0401  BUN 64* 66*  CREATININE 3.41* 3.15*  NA 144 146*  K 3.2* 3.7   Glucose Profile:   Recent Labs  03/05/15 1807 03/05/15 2205 03/06/15 0807  GLUCAP 155* 115* 129*   Meds: D5 at 50 ml/hr, glucotrol, novolog, MVI  Nutrition Focused Physical  Exam: Unable to complete Nutrition-Focused physical exam at this time.   Height:   Ht Readings from Last 1 Encounters:  03/05/15 '5\' 4"'$  (1.626 m)    Weight: per weight encounters, 10% wt loss in 3 months  Wt Readings from Last 1 Encounters:  03/05/15 172 lb (78.019 kg)   Wt Readings from Last 10 Encounters:  03/05/15 172 lb (78.019 kg)  12/21/14 201 lb (91.173 kg)  12/18/14 191 lb 8 oz (86.864 kg)    BMI:  Body mass index is 29.51 kg/(m^2).  Estimated Nutritional Needs:   Kcal:  1229-1474 kcals (BEE 945, 1.3 AF, 1.0-1.2 IF)   Protein:  55-66 g (1.0-1.2 g/kg)   Fluid:  1375-1650 mL (25-30 ml/kg)    HIGH Care Level  Kerman Passey MS, RD, LDN [phone] Pager  [phone] Weekend/On-Call Pager

## 2015-03-06 NOTE — Progress Notes (Signed)
[facility]  ROUNDING NOTE   Subjective:     Objective:  Vital signs in last 24 hours:  Temp:  [97.8 F (36.6 C)-100.1 F (37.8 C)] 98 F (36.7 C) (01/07 1603) Pulse Rate:  [76-194] 76 (01/07 1603) Resp:  [18-20] 18 (01/07 1603) BP: (97-173)/(66-79) 115/75 mmHg (01/07 1603) SpO2:  [92 %-100 %] 92 % (01/07 1603)  Weight change:  Filed Weights   03/05/15 1111  Weight: 78.019 kg (172 lb)    Intake/Output: I/O last 3 completed shifts: In: 600 [I.V.:600] Out: -    Intake/Output this shift:  Total I/O In: 377.5 [P.O.:220; I.V.:157.5] Out: 1 [Urine:1]  Physical Exam: General: NAD, laying in bed  Head: Normocephalic, atraumatic. Moist oral mucosal membranes  Eyes: Anicteric, PERRL  Neck: Supple, trachea midline  Lungs:  Clear to auscultation  Heart: Regular rate and rhythm  Abdomen:  Soft, nontender,   Extremities: no peripheral edema.  Neurologic: Alert to self only  Skin: No lesions       Basic Metabolic Panel:  Recent Labs Lab 03/05/15 1137 03/06/15 0401  NA 144 146*  K 3.2* 3.7  CL 100* 104  CO2 30 30  GLUCOSE 162* 137*  BUN 64* 66*  CREATININE 3.41* 3.15*  CALCIUM 8.1* 7.8*    Liver Function Tests:  Recent Labs Lab 03/05/15 1137  AST 44*  ALT 22  ALKPHOS 96  BILITOT 0.9  PROT 6.2*  ALBUMIN 2.1*   No results for input(s): LIPASE, AMYLASE in the last 168 hours. No results for input(s): AMMONIA in the last 168 hours.  CBC:  Recent Labs Lab 03/05/15 1137 03/06/15 0401  WBC 8.6 7.9  NEUTROABS 6.3  --   HGB 9.2* 8.2*  HCT 28.7* 25.6*  MCV 95.9 97.0  PLT 250 252    Cardiac Enzymes: No results for input(s): CKTOTAL, CKMB, CKMBINDEX, TROPONINI in the last 168 hours.  BNP: Invalid input(s): POCBNP  CBG:  Recent Labs Lab 03/05/15 1807 03/05/15 2205 03/06/15 0807 03/06/15 1141 03/06/15 1618  GLUCAP 155* 115* 129* 127* 136*    Microbiology: Results for orders placed or performed during the hospital encounter  of 03/05/15  Urine culture     Status: None (Preliminary result)   Collection Time: 03/05/15 11:38 AM  Result Value Ref Range Status   Specimen Description URINE, RANDOM  Final   Special Requests NONE  Final   Culture   Final    >=100,000 COLONIES/mL GRAM NEGATIVE RODS IDENTIFICATION AND SUSCEPTIBILITIES TO FOLLOW    Report Status PENDING  Incomplete  CULTURE, BLOOD (ROUTINE X 2) w Reflex to PCR ID Panel     Status: None (Preliminary result)   Collection Time: 03/05/15  6:44 PM  Result Value Ref Range Status   Specimen Description BLOOD LEFT ASSIST CONTROL  Final   Special Requests BOTTLES DRAWN AEROBIC AND ANAEROBIC 4CC  Final   Culture NO GROWTH < 12 HOURS  Final   Report Status PENDING  Incomplete  CULTURE, BLOOD (ROUTINE X 2) w Reflex to PCR ID Panel     Status: None (Preliminary result)   Collection Time: 03/05/15  7:54 PM  Result Value Ref Range Status   Specimen Description BLOOD LEFT ASSIST CONTROL  Final   Special Requests BOTTLES DRAWN AEROBIC AND ANAEROBIC 4CC  Final   Culture NO GROWTH < 12 HOURS  Final   Report Status PENDING  Incomplete    Coagulation Studies: No results for input(s): LABPROT, INR in the last 72 hours.  Urinalysis:  Recent Labs  03/05/15 1138  COLORURINE AMBER*  LABSPEC 1.014  PHURINE 6.0  GLUCOSEU NEGATIVE  HGBUR NEGATIVE  BILIRUBINUR NEGATIVE  KETONESUR TRACE*  PROTEINUR 100*  NITRITE NEGATIVE  LEUKOCYTESUR 3+*      Imaging: Dg Chest 2 View  03/05/2015  CLINICAL DATA:  Patient with altered mental status. EXAM: CHEST  2 VIEW COMPARISON:  Chest radiograph 12/21/2014. FINDINGS: Stable enlarged cardiac and mediastinal contours. Interval development of heterogeneous opacities within the lung bases bilaterally. Re- demonstrated nodular pleural parenchymal thickening within the right lung apex. Small bilateral pleural effusions. No pneumothorax. Thoracic spine degenerative changes. IMPRESSION: Small bilateral layering pleural effusions with  underlying opacities favored to represent atelectasis. Electronically Signed   By: Lovey Newcomer M.D.   On: 03/05/2015 12:42   US Renal  03/06/2015  CLINICAL DATA:  Patient with acute on chronic renal failure. EXAM: RENAL / URINARY TRACT ULTRASOUND COMPLETE COMPARISON:  Renal ultrasound 12/09/2014. FINDINGS: Right Kidney: Length: 8.4 cm. Diffusely increased renal cortical echogenicity. Normal renal cortical thickness. No hydronephrosis. Left Kidney: Length: 8.6 cm. Diffusely increased renal cortical echogenicity. Normal renal cortical thickness. No hydronephrosis. Within the interpolar region of the left kidney there is suggestion a 5 mm echogenic focus. Bladder: Appears normal for degree of bladder distention. IMPRESSION: Diffusely increased renal cortical echogenicity as can be seen with chronic medical renal disease. Probable 5 mm stone within the interpolar region of the left kidney. Electronically Signed   By: Lovey Newcomer M.D.   On: 03/06/2015 14:23   US Venous Img Upper Uni Right  03/05/2015  CLINICAL DATA:  Upper extremity swelling on the right of unknown duration, patient with dementia EXAM: Right UPPER EXTREMITY VENOUS DOPPLER ULTRASOUND TECHNIQUE: Gray-scale sonography with graded compression, as well as color Doppler and duplex ultrasound were performed to evaluate the upper extremity deep venous system from the level of the subclavian vein and including the jugular, axillary, basilic, radial, ulnar and upper cephalic vein. Spectral Doppler was utilized to evaluate flow at rest and with distal augmentation maneuvers. COMPARISON:  None. FINDINGS: Contralateral Subclavian Vein: Respiratory phasicity is normal and symmetric with the symptomatic side. No evidence of thrombus. Normal compressibility. Internal Jugular Vein: No evidence of thrombus. Normal compressibility, respiratory phasicity and response to augmentation. Subclavian Vein: No evidence of thrombus. Normal compressibility, respiratory phasicity  and response to augmentation. Axillary Vein: No evidence of thrombus. Normal compressibility, respiratory phasicity and response to augmentation. Cephalic Vein: No evidence of thrombus. Normal compressibility, respiratory phasicity and response to augmentation. Basilic Vein: No evidence of thrombus. Normal compressibility, respiratory phasicity and response to augmentation. Brachial Veins: No evidence of thrombus. Normal compressibility, respiratory phasicity and response to augmentation. Radial Veins: No evidence of thrombus. Normal compressibility, respiratory phasicity and response to augmentation. Ulnar Veins: No evidence of thrombus. Normal compressibility, respiratory phasicity and response to augmentation. Venous Reflux:  None visualized. Other Findings:  Severe right upper extremity soft tissue edema. IMPRESSION: No evidence of deep venous thrombosis. Electronically Signed   By: Skipper Cliche M.D.   On: 03/05/2015 19:33   Dg Foot Complete Right  03/05/2015  CLINICAL DATA:  Generalized weakness. EXAM: RIGHT FOOT COMPLETE - 3+ VIEW COMPARISON:  None. FINDINGS: There is no evidence of fracture or dislocation. There is no evidence of arthropathy or other focal bone abnormality. Soft tissues are unremarkable. IMPRESSION: Negative. Electronically Signed   By: Franchot Gallo M.D.   On: 03/05/2015 12:42     Medications:   . dextrose 50 mL/hr at 03/06/15 1037   .  carvedilol  3.125 mg Oral BID  . cefTRIAXone (ROCEPHIN)  IV  1 g Intravenous Q24H  . cetirizine  10 mg Oral Daily  . diltiazem  120 mg Oral Daily  . docusate sodium  100 mg Oral BID  . FLUoxetine  10 mg Oral Daily  . glipiZIDE  5 mg Oral BID WC  . heparin  5,000 Units Subcutaneous 3 times per day  . hydrALAZINE  25 mg Oral TID  . insulin aspart  0-5 Units Subcutaneous QHS  . insulin aspart  0-9 Units Subcutaneous TID WC  . isosorbide mononitrate  30 mg Oral Daily  . levothyroxine  112 mcg Oral QAC breakfast  . multivitamin with  minerals  1 tablet Oral Daily  . olopatadine  1 drop Both Eyes Daily  . oxybutynin  5 mg Oral BID  . pantoprazole  40 mg Oral QAC breakfast  . sodium bicarbonate  650 mg Oral BID  . Vitamin D (Ergocalciferol)  50,000 Units Oral Q7 days   acetaminophen **OR** acetaminophen, albuterol, clobetasol cream, LORazepam, ondansetron **OR** ondansetron (ZOFRAN) IV, polyethylene glycol  Assessment/ Plan:  Ms. Paige James is a 82 y.o. black female with diabetes mellitus type II, hypertension, history of breast cancer status post mastectomy, dementia, congestive heart failure, atrial fibrillation, hypothyroidism who was admitted on 03/05/2015   1. Acute renal failure on chronic kidney disease stage IV with metabolic acidosis: baseline Cr 1.7 egfr 29. Acute renal failure likely due to prerenal azotemia from poor PO intake.  Chronic kidney disease is likely multifactorial with contributions from diabetes mellitus, hypertension, and age.  [facility] nephrology as outpatient. - agree with gentle IV fluids - not a good candidate for dialysis.  - Continue sodium bicarbonate for metabolic acidosis from renal failure  2. Urinary tract infection: gram negative rods on urine culture.  - empiric therapy with ceftriaxone  3. Hypertension: blood pressure at goal - diltiazem, isosorbide mononitrate, hydralazine and carvedilol - not on an ACE-I/ARB as an outpatient.   4. Anemia of chronic kidney disease: hemoglobin of 8.2 on admission - has received epo in the past.   5. Diabetes Mellitus type II with chronic kidney disease:  - continue glucose control.    LOS: 1 Prajwal Fellner 1/7/20175:02 PM

## 2015-03-06 NOTE — Progress Notes (Signed)
Spoke with patients Guardian updated her on patients status and is agreeable to patient having central line placed.

## 2015-03-06 NOTE — Progress Notes (Signed)
PT Cancellation Note  Patient Details Name: Paige James MRN: [id number] DOB: December 17, 1920   Cancelled Treatment:    Reason Eval/Treat Not Completed: Patient's level of consciousness Pt very confused during attempt to see her, she told PT to go, then agreed to try and sit up.  After fidgeting around in the bed a little she became more agitated and ultimately is became clear that getting her to participate today was going to be futile.  Will try back later to see if she is appropriate.  Kreg Shropshire 03/06/2015, 3:37 PM

## 2015-03-06 NOTE — Evaluation (Signed)
Clinical/Bedside Swallow Evaluation Patient Details  Name: Paige James MRN: [id number] Date of Birth: 02-23-1921  Today's Date: 03/06/2015 Time: SLP Start Time (ACUTE ONLY): 1000 SLP Stop Time (ACUTE ONLY): 1100 SLP Time Calculation (min) (ACUTE ONLY): 60 min  Past Medical History:  Past Medical History  Diagnosis Date  . Renal disorder     CKD stage 4  . Diabetes mellitus without complication ([place])   . Thyroid disease     Hypothyroidism  . Senile dementia   . Hypertension   . Atopic dermatitis   . Osteoarthritis   . Depressive disorder   . Atrial fibrillation ([place])   . Breast cancer [facility])     s/p right mastectomy  . Congestive heart failure (CHF) ([facility])     Ejection fraction of 45-50%   Past Surgical History:  Past Surgical History  Procedure Laterality Date  . Cataract surgery    . Mastectomy      right sided mastectomy  . Hip surgery    . Right knee surgery    . Left knee surgery     HPI:  Pt is a 82 y.o. female with a known history of dementia, systolic CHF with EF of 24%, CK D stage IV, atrial fibrillation not on anticoagulation, osteoarthritis who is from [facility] care home was brought in secondary to worsening confusion. She usually ambulates with the help of a walker, but was very weak and confused and was unable to get out of bed today. EMS was called, she was complaining of pain in her feet. She also smells of urine and was incontinent. No fevers reported. No elevation in white count. urine analysis significant for UTI. NSG reported pt has been hitting but w/ redirection, pt participated briefly in eval and accepted po trials w/ SLP. Pt intermittently verbal and responded to a few basic questions about herself and the snowy weather outside. She did not readily follow commands.    Assessment / Plan / Recommendation Clinical Impression  Pt appeared to tolerate trials of thin lqiuids via straw and few trials of puree w/ no immediate, overt s/s of aspiration noted.  Pt's vocal quality was clear b/t trials and no decline in respiratory status was noted during/post trials. Oral phase adequate for bolus management of thin liquids and the few puree trials once pt was directed to task w/ encouragement and understood the task. Pt was in support w/ drinking and monitored for bolus size and amount to reduce risk for aspiration. Pt declined further trials after the few she took. No trials of solids were given d/t her Cognitive status/decline and no lower dentures in place. Pt appears at mild risk for aspiration following general aspiration precautions and requires monitoring for support/encouragement at meals as well as positioning upright in bed for po's d/t Cognitive decline/status. Of note, moderate belching was noted w/ the few po trials given. (GERD?) ST will f/u w/ toleration of diet and trials to upgrade as appropriate. NSG and CM updated.     Aspiration Risk  Mild aspiration risk    Diet Recommendation  Dys. 1 w/ thin liquids; trials to upgrade diet as appropriate when Cognitive status improves; aspiration precautions; assistance at meals - reduce distractions  Medication Administration: Crushed with puree    Other  Recommendations Recommended Consults:  (Dietician) Oral Care Recommendations: Oral care BID;Staff/trained caregiver to provide oral care   Follow up Recommendations  Skilled Nursing facility    Frequency and Duration min 2x/week  1 week  Prognosis Prognosis for Safe Diet Advancement: Fair Barriers to Reach Goals: Cognitive deficits;Severity of deficits      Swallow Study   General Date of Onset: 03/05/15 HPI: Pt is a 82 y.o. female with a known history of dementia, systolic CHF with EF of 43%, CK D stage IV, atrial fibrillation not on anticoagulation, osteoarthritis who is from my family care home was brought in secondary to worsening confusion. She usually ambulates with the help of a walker, but was very weak and confused and was  unable to get out of bed today. EMS was called, she was complaining of pain in her feet. She also smells of urine and was incontinent. No fevers reported. No elevation in white count. urine analysis significant for UTI. NSG reported pt has been hitting but w/ redirection, pt participated briefly in eval and accepted po trials w/ SLP. Pt intermittently verbal and responded to a few basic questions about herself and the snowy weather outside. She did not readily follow commands.  Type of Study: Bedside Swallow Evaluation Previous Swallow Assessment: none Diet Prior to this Study: Regular;Thin liquids (at admit) Temperature Spikes Noted: No (wbc 8.6) Respiratory Status: Room air History of Recent Intubation: No Behavior/Cognition: Confused;Agitated;Distractible;Requires cueing;Doesn't follow directions Oral Cavity Assessment:  (difficult to assess; has no lower dentition) Oral Care Completed by SLP:  (would not allow) Oral Cavity - Dentition: Dentures, bottom (not in place) Vision:  (n/a) Self-Feeding Abilities: Total assist Patient Positioning: Postural control adequate for testing Baseline Vocal Quality: Normal (mumbled) Volitional Cough: Cognitively unable to elicit Volitional Swallow: Unable to elicit    Oral/Motor/Sensory Function Overall Oral Motor/Sensory Function:  (could not assess sec. to pt's cognitive status)   Ice Chips Ice chips: Not tested   Thin Liquid Thin Liquid: Impaired Presentation: Straw (assisted w/ feeding; ~3-4 ozs) Oral Phase Impairments: Poor awareness of bolus (initially until task kept going) Oral Phase Functional Implications: Oral holding (x1) Pharyngeal  Phase Impairments:  (none) Other Comments: needed encouragement to direct attention to task of taking po's.    Nectar Thick Nectar Thick Liquid: Not tested   Honey Thick Honey Thick Liquid: Not tested   Puree Puree: Impaired Presentation: Spoon (fed; 3 trials accepted) Oral Phase Impairments: Poor awareness  of bolus (needed direction and encouragement initially) Pharyngeal Phase Impairments:  (none)   Solid   GO    Solid: Not tested Other Comments: d/t Cognitive status/decline and not wearing lower dentures       Orinda Kenner, MS, CCC-SLP  Watson,Katherine 03/06/2015,12:26 PM

## 2015-03-06 NOTE — Procedures (Signed)
Central Venous Catheter Placement: Indication:  Patient has limited or no vascular access and requires IV therapy.   Consent:the patient is a ward of the state and the poa could not be reached.    Hand washing performed prior to starting the procedure.   Procedure: An active timeout was performed and correct patient, name, & ID confirmed.  After explaining risk and benefits, patient was positioned, however due to torticollis and altered mental status, adequate positioning was problematic.Patient was prepped using strict sterile technique including chlorohexadine preps, sterile drape, sterile gown and sterile gloves.  The area was prepped, draped and anesthetized in the usual sterile manner. Patient comfort was obtained.  A triple lumen catheter was placed in left subclavian vein via supraclavicular approach.  There was good blood return, catheter caps were placed on lumens, catheter flushed easily, the line was secured and a sterile dressing and BIO-PATCH applied.   Ultrasound was used to visualize vasculature and guidance of needle.   Number of Attempts: 1 Complications:none Estimated Blood Loss: none Chest Radiograph indicated and ordered.   Operator: [name], M.D.   [name], M.D.  Pulmonary & Critical Care Medicine

## 2015-03-06 NOTE — Progress Notes (Signed)
[facility] [place] NAME: Paige James    MR#:  [id number]  DATE OF BIRTH:  06/07/1920  SUBJECTIVE:  CHIEF COMPLAINT:   Chief Complaint  Patient presents with  . Back Pain  . Difficulty Walking   Patient is a 82 year old African-American female who presents to the hospital with back pain and difficulty walking. Patient is confused and restless even combative, unable to review of systems. Emergency room, patient was noted to be in acute on chronic renal failure with creatinine level of 3.15, baseline being around 2. Urinalysis was concerning for urinary tract infection, she was admitted for antibiotic infusion, now on Rocephin.     ,   Review of Systems  Unable to perform ROS: dementia    VITAL SIGNS: Blood pressure 151/66, pulse 86, temperature 97.8 F (36.6 C), temperature source Axillary, resp. rate 20, height '5\' 4"'$  (1.626 m), weight 78.019 kg (172 lb), SpO2 98 %.  PHYSICAL EXAMINATION:   GENERAL:  82 y.o.-year-old patient lying in the in moderate  Distress, curled in one position in bed , covering her head with the blanket , pushing away examiner   EYES: Pupils equal, round, reactive to light and accommodation. No scleral icterus. Extraocular muscles intact.  HEENT: Head atraumatic, normocephalic. Oropharynx and nasopharynx clear.  NECK:  Supple, no jugular venous distention. No thyroid enlargement, no tenderness.  LUNGS: Normal breath sounds bilaterally, no wheezing, rales,rhonchi or crepitation. No use of accessory muscles of respiration.  CARDIOVASCULAR: S1, S2 normal. No murmurs, rubs, or gallops.  ABDOMEN: Soft, Uncomfortable on palpation, although examination is limited as patient is pushing examiner awaynondistended. Bowel sounds present. No organomegaly or mass.  EXTREMITIES: No pedal edema, cyanosis, or clubbing. right upper extremity swelling  NEUROLOGIC: Cranial nerves II through XII are intact. Muscle strength 5/5  in all extremities. Sensation intact. Gait not checked.  PSYCHIATRIC: The patient is alert , confused, agitated .  SKIN: No obvious rash, lesion, or ulcer.   ORDERS/RESULTS REVIEWED:   CBC  Recent Labs Lab 03/05/15 1137 03/06/15 0401  WBC 8.6 7.9  HGB 9.2* 8.2*  HCT 28.7* 25.6*  PLT 250 252  MCV 95.9 97.0  MCH 30.7 31.1  MCHC 32.0 32.1  RDW 16.7* 17.0*  LYMPHSABS 1.5  --   MONOABS 0.6  --   EOSABS 0.1  --   BASOSABS 0.0  --    ------------------------------------------------------------------------------------------------------------------  Chemistries   Recent Labs Lab 03/05/15 1137 03/06/15 0401  NA 144 146*  K 3.2* 3.7  CL 100* 104  CO2 30 30  GLUCOSE 162* 137*  BUN 64* 66*  CREATININE 3.41* 3.15*  CALCIUM 8.1* 7.8*  AST 44*  --   ALT 22  --   ALKPHOS 96  --   BILITOT 0.9  --    ------------------------------------------------------------------------------------------------------------------ estimated creatinine clearance is 11.5 mL/min (by C-G formula based on Cr of 3.15). ------------------------------------------------------------------------------------------------------------------ No results for input(s): TSH, T4TOTAL, T3FREE, THYROIDAB in the last 72 hours.  Invalid input(s): FREET3  Cardiac Enzymes No results for input(s): CKMB, TROPONINI, MYOGLOBIN in the last 168 hours.  Invalid input(s): CK ------------------------------------------------------------------------------------------------------------------ Invalid input(s): POCBNP ---------------------------------------------------------------------------------------------------------------  RADIOLOGY: Dg Chest 2 View  03/05/2015  CLINICAL DATA:  Patient with altered mental status. EXAM: CHEST  2 VIEW COMPARISON:  Chest radiograph 12/21/2014. FINDINGS: Stable enlarged cardiac and mediastinal contours. Interval development of heterogeneous opacities within the lung bases bilaterally. Re-  demonstrated nodular pleural parenchymal thickening within the right lung apex. Small bilateral  pleural effusions. No pneumothorax. Thoracic spine degenerative changes. IMPRESSION: Small bilateral layering pleural effusions with underlying opacities favored to represent atelectasis. Electronically Signed   By: Lovey Newcomer M.D.   On: 03/05/2015 12:42   US Venous Img Upper Uni Right  03/05/2015  CLINICAL DATA:  Upper extremity swelling on the right of unknown duration, patient with dementia EXAM: Right UPPER EXTREMITY VENOUS DOPPLER ULTRASOUND TECHNIQUE: Gray-scale sonography with graded compression, as well as color Doppler and duplex ultrasound were performed to evaluate the upper extremity deep venous system from the level of the subclavian vein and including the jugular, axillary, basilic, radial, ulnar and upper cephalic vein. Spectral Doppler was utilized to evaluate flow at rest and with distal augmentation maneuvers. COMPARISON:  None. FINDINGS: Contralateral Subclavian Vein: Respiratory phasicity is normal and symmetric with the symptomatic side. No evidence of thrombus. Normal compressibility. Internal Jugular Vein: No evidence of thrombus. Normal compressibility, respiratory phasicity and response to augmentation. Subclavian Vein: No evidence of thrombus. Normal compressibility, respiratory phasicity and response to augmentation. Axillary Vein: No evidence of thrombus. Normal compressibility, respiratory phasicity and response to augmentation. Cephalic Vein: No evidence of thrombus. Normal compressibility, respiratory phasicity and response to augmentation. Basilic Vein: No evidence of thrombus. Normal compressibility, respiratory phasicity and response to augmentation. Brachial Veins: No evidence of thrombus. Normal compressibility, respiratory phasicity and response to augmentation. Radial Veins: No evidence of thrombus. Normal compressibility, respiratory phasicity and response to augmentation. Ulnar  Veins: No evidence of thrombus. Normal compressibility, respiratory phasicity and response to augmentation. Venous Reflux:  None visualized. Other Findings:  Severe right upper extremity soft tissue edema. IMPRESSION: No evidence of deep venous thrombosis. Electronically Signed   By: Skipper Cliche M.D.   On: 03/05/2015 19:33   Dg Foot Complete Right  03/05/2015  CLINICAL DATA:  Generalized weakness. EXAM: RIGHT FOOT COMPLETE - 3+ VIEW COMPARISON:  None. FINDINGS: There is no evidence of fracture or dislocation. There is no evidence of arthropathy or other focal bone abnormality. Soft tissues are unremarkable. IMPRESSION: Negative. Electronically Signed   By: Franchot Gallo M.D.   On: 03/05/2015 12:42    EKG:  Orders placed or performed during the hospital encounter of 12/08/14  . EKG 12-Lead  . EKG 12-Lead  . ED EKG  . ED EKG  . EKG    ASSESSMENT AND PLAN:  Active Problems:   Cystitis 1. Acute pyelonephritis due to gram-negative rods, continue patient on Rocephin. Following culture results. Blood cultures are negative so far  2. Acute on chronic renal failure , continue patient on IV fluids and follow creatinine in the morning , get a kidney ultrasound   3. Hypernatremia , change every fluids to D5 follow sodium level in the morning  4. Anemia, followed with rehydration and transfuse as needed 5. Metabolic encephalopathy, likely infection related, supportive therapy for now, follow clinically,    Management plans discussed with the patient, family and they are in agreement.   DRUG ALLERGIES: No Known Allergies  CODE STATUS:     Code Status Orders        Start     Ordered   03/05/15 1700  Full code   Continuous     03/05/15 1659      TOTAL TIME TAKING CARE OF THIS PATIENT: 40 minutes.    Theodoro Grist M.D on 03/06/2015 at 12:21 PM  Between 7am to 6pm - Pager - [phone]  After 6pm go to [email or web address] - password Child psychotherapist Hospitalists  Office  [phone]  CC: Primary care physician; Pcp Not In System

## 2015-03-07 ENCOUNTER — Inpatient Hospital Stay: Payer: Medicare Other

## 2015-03-07 LAB — GLUCOSE, CAPILLARY
GLUCOSE-CAPILLARY: 173 mg/dL — AB (ref 65–99)
GLUCOSE-CAPILLARY: 175 mg/dL — AB (ref 65–99)
GLUCOSE-CAPILLARY: 201 mg/dL — AB (ref 65–99)
Glucose-Capillary: 224 mg/dL — ABNORMAL HIGH (ref 65–99)
Glucose-Capillary: 206 mg/dL — ABNORMAL HIGH (ref 65–99)

## 2015-03-07 LAB — BASIC METABOLIC PANEL
ANION GAP: 9 (ref 5–15)
BUN: 70 mg/dL — AB (ref 6–20)
CALCIUM: 7.7 mg/dL — AB (ref 8.9–10.3)
CO2: 33 mmol/L — AB (ref 22–32)
CREATININE: 3 mg/dL — AB (ref 0.44–1.00)
Chloride: 102 mmol/L (ref 101–111)
GFR calc Af Amer: 15 mL/min — ABNORMAL LOW (ref >60)
GFR calc non Af Amer: 13 mL/min — ABNORMAL LOW (ref >60)
GLUCOSE: 167 mg/dL — AB (ref 65–99)
Potassium: 3 mmol/L — ABNORMAL LOW (ref 3.5–5.1)
Sodium: 144 mmol/L (ref 135–145)

## 2015-03-07 LAB — IRON AND TIBC
Iron: 23 ug/dL — ABNORMAL LOW (ref 28–170)
Saturation Ratios: 26 % (ref 10.4–31.8)
TIBC: 89 ug/dL — AB (ref 250–450)
UIBC: 66 ug/dL

## 2015-03-07 LAB — CBC
HCT: 22.7 % — ABNORMAL LOW (ref 35.0–47.0)
Hemoglobin: 7.4 g/dL — ABNORMAL LOW (ref 12.0–16.0)
MCH: 31.2 pg (ref 26.0–34.0)
MCHC: 32.6 g/dL (ref 32.0–36.0)
MCV: 95.8 fL (ref 80.0–100.0)
PLATELETS: 223 10*3/uL (ref 150–440)
RBC: 2.37 MIL/uL — AB (ref 3.80–5.20)
RDW: 16.6 % — AB (ref 11.5–14.5)
WBC: 6.7 10*3/uL (ref 3.6–11.0)

## 2015-03-07 LAB — FERRITIN: Ferritin: 424 ng/mL — ABNORMAL HIGH (ref 11–307)

## 2015-03-07 MED ORDER — DEXTROSE-NACL 5-0.45 % IV SOLN
INTRAVENOUS | Status: DC
  Administered 2015-03-07 – 2015-03-10 (×7): via INTRAVENOUS

## 2015-03-07 MED ORDER — SODIUM CHLORIDE 0.9 % IV BOLUS (SEPSIS)
500.0000 mL | INTRAVENOUS | Status: AC
  Administered 2015-03-07: 500 mL via INTRAVENOUS

## 2015-03-07 MED ORDER — POTASSIUM CHLORIDE 20 MEQ PO PACK: 40.0000 meq | PACK | Freq: Once | ORAL | Status: DC

## 2015-03-07 MED ORDER — POTASSIUM CHLORIDE 10 MEQ/100ML IV SOLN
10.0000 meq | INTRAVENOUS | Status: AC
Start: 2015-03-07 — End: 2015-03-07
  Administered 2015-03-07 (×2): 10 meq via INTRAVENOUS
  Filled 2015-03-07 (×2): qty 100

## 2015-03-07 MED ORDER — DARBEPOETIN ALFA 25 MCG/0.42ML IJ SOSY
25.0000 ug | PREFILLED_SYRINGE | Freq: Once | INTRAMUSCULAR | Status: AC
  Administered 2015-03-07: 25 ug via SUBCUTANEOUS
  Filled 2015-03-07 (×2): qty 0.42

## 2015-03-07 NOTE — Progress Notes (Signed)
[facility]  ROUNDING NOTE   Subjective:   Family states patient's mental status is not at baseline.  Creatinine 3 (3.15) hgb 7.4 (8.2)  D5W at 50  Urine culture with gram negative rods.   Objective:  Vital signs in last 24 hours:  Temp:  [97.8 F (36.6 C)-98.8 F (37.1 C)] 97.8 F (36.6 C) (01/08 0745) Pulse Rate:  [76-94] 78 (01/08 0823) Resp:  [17-20] 17 (01/08 0745) BP: (100-202)/(47-176) 117/67 mmHg (01/08 0823) SpO2:  [85 %-100 %] 85 % (01/08 0745)  Weight change:  Filed Weights   03/05/15 1111  Weight: 78.019 kg (172 lb)    Intake/Output: I/O last 3 completed shifts: In: 2860.5 [P.O.:220; I.V.:2640.5] Out: 1 [Urine:1]   Intake/Output this shift:  Total I/O In: 647.5 [P.O.:240; I.V.:307.5; IV Piggyback:100] Out: -   Physical Exam: General: NAD, laying in bed  Head: Normocephalic, atraumatic. Moist oral mucosal membranes  Eyes: Anicteric, PERRL  Neck: Supple, trachea midline  Lungs:  Clear to auscultation  Heart: Regular rate and rhythm  Abdomen:  Soft, nontender,   Extremities: no peripheral edema.  Neurologic: Alert to self only  Skin: No lesions       Basic Metabolic Panel:  Recent Labs Lab 03/05/15 1137 03/06/15 0401 03/07/15 0509  NA 144 146* 144  K 3.2* 3.7 3.0*  CL 100* 104 102  CO2 30 30 33*  GLUCOSE 162* 137* 167*  BUN 64* 66* 70*  CREATININE 3.41* 3.15* 3.00*  CALCIUM 8.1* 7.8* 7.7*    Liver Function Tests:  Recent Labs Lab 03/05/15 1137  AST 44*  ALT 22  ALKPHOS 96  BILITOT 0.9  PROT 6.2*  ALBUMIN 2.1*   No results for input(s): LIPASE, AMYLASE in the last 168 hours. No results for input(s): AMMONIA in the last 168 hours.  CBC:  Recent Labs Lab 03/05/15 1137 03/06/15 0401 03/07/15 0509  WBC 8.6 7.9 6.7  NEUTROABS 6.3  --   --   HGB 9.2* 8.2* 7.4*  HCT 28.7* 25.6* 22.7*  MCV 95.9 97.0 95.8  PLT 250 252 223    Cardiac Enzymes: No results for input(s): CKTOTAL, CKMB, CKMBINDEX, TROPONINI  in the last 168 hours.  BNP: Invalid input(s): POCBNP  CBG:  Recent Labs Lab 03/06/15 1141 03/06/15 1618 03/06/15 2028 03/07/15 0905 03/07/15 1149  GLUCAP 127* 136* 134* 175* 224*    Microbiology: Results for orders placed or performed during the hospital encounter of 03/05/15  Urine culture     Status: None (Preliminary result)   Collection Time: 03/05/15 11:38 AM  Result Value Ref Range Status   Specimen Description URINE, RANDOM  Final   Special Requests NONE  Final   Culture   Final    >=100,000 COLONIES/mL GRAM NEGATIVE RODS IDENTIFICATION AND SUSCEPTIBILITIES TO FOLLOW    Report Status PENDING  Incomplete  CULTURE, BLOOD (ROUTINE X 2) w Reflex to PCR ID Panel     Status: None (Preliminary result)   Collection Time: 03/05/15  6:44 PM  Result Value Ref Range Status   Specimen Description BLOOD LEFT ASSIST CONTROL  Final   Special Requests BOTTLES DRAWN AEROBIC AND ANAEROBIC 4CC  Final   Culture NO GROWTH 2 DAYS  Final   Report Status PENDING  Incomplete  CULTURE, BLOOD (ROUTINE X 2) w Reflex to PCR ID Panel     Status: None (Preliminary result)   Collection Time: 03/05/15  7:54 PM  Result Value Ref Range Status   Specimen Description BLOOD LEFT ASSIST CONTROL  Final   Special Requests BOTTLES DRAWN AEROBIC AND ANAEROBIC 4CC  Final   Culture NO GROWTH 2 DAYS  Final   Report Status PENDING  Incomplete    Coagulation Studies: No results for input(s): LABPROT, INR in the last 72 hours.  Urinalysis:  Recent Labs  03/05/15 1138  COLORURINE AMBER*  LABSPEC 1.014  PHURINE 6.0  GLUCOSEU NEGATIVE  HGBUR NEGATIVE  BILIRUBINUR NEGATIVE  KETONESUR TRACE*  PROTEINUR 100*  NITRITE NEGATIVE  LEUKOCYTESUR 3+*      Imaging: Dg Knee 1-2 Views Right  03/07/2015  CLINICAL DATA:  Right knee pain.  Right knee surgery in 2008 (? ). EXAM: RIGHT KNEE - 1-2 VIEW COMPARISON:  None. FINDINGS: There is plate and screw fixation hardware of the distal right femur. There are  severe tricompartmental degenerative changes of the right knee, with complete or near complete joint space loss throughout, with abutment of the femoral condyles and tibial plateau. Associated degenerative osteophyte formation. No acute- appearing cortical irregularity or osseous lesion. No acute fracture line or displaced fracture fragment. No appreciable joint effusion. Extensive atherosclerotic vascular calcifications seen posterior to the knee joint. IMPRESSION: 1. Severe tricompartmental DJD, with complete or near complete joint space loss throughout, with abutment of the femoral condyles and tibial plateau. 2. Plate and screw fixation hardware appears grossly intact and well positioned within the distal right femur. 3. No acute findings. Electronically Signed   By: Franki Cabot M.D.   On: 03/07/2015 10:39   US Renal  03/06/2015  CLINICAL DATA:  Patient with acute on chronic renal failure. EXAM: RENAL / URINARY TRACT ULTRASOUND COMPLETE COMPARISON:  Renal ultrasound 12/09/2014. FINDINGS: Right Kidney: Length: 8.4 cm. Diffusely increased renal cortical echogenicity. Normal renal cortical thickness. No hydronephrosis. Left Kidney: Length: 8.6 cm. Diffusely increased renal cortical echogenicity. Normal renal cortical thickness. No hydronephrosis. Within the interpolar region of the left kidney there is suggestion a 5 mm echogenic focus. Bladder: Appears normal for degree of bladder distention. IMPRESSION: Diffusely increased renal cortical echogenicity as can be seen with chronic medical renal disease. Probable 5 mm stone within the interpolar region of the left kidney. Electronically Signed   By: Lovey Newcomer M.D.   On: 03/06/2015 14:23   US Venous Img Upper Uni Right  03/05/2015  CLINICAL DATA:  Upper extremity swelling on the right of unknown duration, patient with dementia EXAM: Right UPPER EXTREMITY VENOUS DOPPLER ULTRASOUND TECHNIQUE: Gray-scale sonography with graded compression, as well as color  Doppler and duplex ultrasound were performed to evaluate the upper extremity deep venous system from the level of the subclavian vein and including the jugular, axillary, basilic, radial, ulnar and upper cephalic vein. Spectral Doppler was utilized to evaluate flow at rest and with distal augmentation maneuvers. COMPARISON:  None. FINDINGS: Contralateral Subclavian Vein: Respiratory phasicity is normal and symmetric with the symptomatic side. No evidence of thrombus. Normal compressibility. Internal Jugular Vein: No evidence of thrombus. Normal compressibility, respiratory phasicity and response to augmentation. Subclavian Vein: No evidence of thrombus. Normal compressibility, respiratory phasicity and response to augmentation. Axillary Vein: No evidence of thrombus. Normal compressibility, respiratory phasicity and response to augmentation. Cephalic Vein: No evidence of thrombus. Normal compressibility, respiratory phasicity and response to augmentation. Basilic Vein: No evidence of thrombus. Normal compressibility, respiratory phasicity and response to augmentation. Brachial Veins: No evidence of thrombus. Normal compressibility, respiratory phasicity and response to augmentation. Radial Veins: No evidence of thrombus. Normal compressibility, respiratory phasicity and response to augmentation. Ulnar Veins: No evidence of thrombus.  Normal compressibility, respiratory phasicity and response to augmentation. Venous Reflux:  None visualized. Other Findings:  Severe right upper extremity soft tissue edema. IMPRESSION: No evidence of deep venous thrombosis. Electronically Signed   By: Skipper Cliche M.D.   On: 03/05/2015 19:33   Dg Chest Port 1 View  03/06/2015  CLINICAL DATA:  Central line placement. EXAM: PORTABLE CHEST 1 VIEW COMPARISON:  03/05/2015 FINDINGS: Left subclavian approach central venous line placement is seen, with tip terminating at the expected location of the right atrium. Cardiomediastinal  silhouette is enlarged. Mediastinal contours appear intact. There is no evidence of pneumothorax. Bibasilar opacities likely represent pleural effusions with associated subsegmental atelectasis. Osseous structures are without acute abnormality. Soft tissues are grossly normal. IMPRESSION: Status post central venous line placement without evidence of pneumothorax. Persistent bibasilar airspace opacities likely representing layering pleural effusions and associated subsegmental atelectasis. Electronically Signed   By: Fidela Salisbury M.D.   On: 03/06/2015 17:52     Medications:   . dextrose 50 mL/hr at 03/07/15 1133   . carvedilol  3.125 mg Oral BID  . cefTRIAXone (ROCEPHIN)  IV  1 g Intravenous Q24H  . cetirizine  10 mg Oral Daily  . Darbepoetin Alfa  25 mcg Subcutaneous Once  . diltiazem  120 mg Oral Daily  . docusate sodium  100 mg Oral BID  . FLUoxetine  10 mg Oral Daily  . glipiZIDE  5 mg Oral BID WC  . heparin  5,000 Units Subcutaneous 3 times per day  . hydrALAZINE  25 mg Oral TID  . insulin aspart  0-5 Units Subcutaneous QHS  . insulin aspart  0-9 Units Subcutaneous TID WC  . isosorbide mononitrate  30 mg Oral Daily  . levothyroxine  112 mcg Oral QAC breakfast  . multivitamin with minerals  1 tablet Oral Daily  . olopatadine  1 drop Both Eyes Daily  . oxybutynin  5 mg Oral BID  . pantoprazole  40 mg Oral QAC breakfast  . sodium bicarbonate  650 mg Oral BID  . Vitamin D (Ergocalciferol)  50,000 Units Oral Q7 days   acetaminophen **OR** acetaminophen, albuterol, clobetasol cream, LORazepam, ondansetron **OR** ondansetron (ZOFRAN) IV, polyethylene glycol  Assessment/ Plan:  Ms. SUMMERLYNN GLAUSER is a 82 y.o. black female with diabetes mellitus type II, hypertension, history of breast cancer status post mastectomy, dementia, congestive heart failure, atrial fibrillation, hypothyroidism who was admitted on 03/05/2015   1. Acute renal failure on chronic kidney disease stage IV with  metabolic acidosis: baseline Cr 1.7 egfr 29. Acute renal failure likely due to prerenal azotemia from poor PO intake.  Chronic kidney disease is likely multifactorial with contributions from diabetes mellitus, hypertension, and age.  [facility] nephrology as outpatient. - agree with gentle IV fluids - not a good candidate for dialysis.  - Continue sodium bicarbonate for metabolic acidosis from renal failure.   2. Urinary tract infection: gram negative rods on urine culture.  - empiric therapy with ceftriaxone  3. Hypertension: blood pressure at goal - diltiazem, isosorbide mononitrate, hydralazine and carvedilol - not on an ACE-I/ARB as an outpatient.   4. Anemia of chronic kidney disease: hemoglobin is dropping.  - has received epo in the past.   5. Diabetes Mellitus type II with chronic kidney disease:  - continue glucose control.    LOS: [place], Zarius Furr 1/8/20172:18 PM

## 2015-03-07 NOTE — Clinical Social Work Note (Signed)
Clinical Social Work Assessment  Patient Details  Name: Paige James MRN: [id number] Date of Birth: 04-Dec-1920  Date of referral:  03/07/15               Reason for consult:  Other (Comment Required) (From Group Home. )                Permission sought to share information with:    Permission granted to share information::     Name::        Agency::     Relationship::     Contact Information:     Housing/Transportation Living arrangements for the past 2 months:  Group Home Source of Information:  Other (Comment Required) (Could not reach anybody. ) Patient Interpreter Needed:  None Criminal Activity/Legal Involvement Pertinent to Current Situation/Hospitalization:  No - Comment as needed Significant Relationships:  Other(Comment) (DSS Guardian ) Lives with:  Facility Resident Do you feel safe going back to the place where you live?   (unable to assess) Need for family participation in patient care:  Yes (Comment)  Care giving concerns: Per chart patient is from [place] [phone]   Social Worker assessment / plan:  Per chart patient is from ALF. Clinical Education officer, museum (CSW) attempted to call ALF number listed and did not get an answer and left a voicemail. Per chart patient has lived at [place] before. CSW attempted to call [facility] however she did not answer and a voicemail was left. Per chart Paige James is DSS guardian. CSW left a voicemail for Paige James [phone] and [place] with DSS [phone]. CSW attempted to meet with patient however she could not give any meaningful information. Patient was not alert and oriented.   FL2 complete.    Employment status:  Disabled (Comment on whether or not currently receiving Disability) Insurance information:  Managed Medicare PT Recommendations:  Not assessed at this time Information / Referral to community resources:  Other (Comment Required) (Group Home. )  Patient/Family's Response to  care: CSW attempted to call guardian and group home with no success.   Patient/Family's Understanding of and Emotional Response to Diagnosis, Current Treatment, and Prognosis: CSW waiting on return call from [place] guardian and group home.   Emotional Assessment Appearance:  Appears stated age Attitude/Demeanor/Rapport:  Unable to Assess Affect (typically observed):  Unable to Assess Orientation:  Fluctuating Orientation (Suspected and/or reported Sundowners), Oriented to Self Alcohol / Substance use:  Not Applicable Psych involvement (Current and /or in the community):  No (Comment)  Discharge Needs  Concerns to be addressed:  Discharge Planning Concerns Readmission within the last 30 days:  No Current discharge risk:  Chronically ill Barriers to Discharge:  Continued Medical Work up   Loralyn Freshwater, LCSW 03/07/2015, 11:13 AM

## 2015-03-07 NOTE — Progress Notes (Addendum)
Pt has not voided all shift. BP 75/48 then 97/47 10 minutes after initial recording. Dr Charlann Boxer made aware orders received to bolus and change IV fluids. Bladder scanned patient which showed around 170 ml in bladder. Will continue to monitor. At 1715 pt has become more alert and making relevant comments such as: "Where is the bathroom...Marland KitchenMarland KitchenPlease place my water on my table". I asked her if she needed to use the bathroom she states not yet. Last BP 101/50

## 2015-03-07 NOTE — NC FL2 (Signed)
[place] LEVEL OF CARE SCREENING TOOL     IDENTIFICATION  Patient Name: Paige James Birthdate: 1920/04/24 Sex: female Admission Date (Current Location): 03/05/2015  [place] and [place] Number:  Engineering geologist and Address:  [facility], [address], [place], [place] [address]      Provider Number: [id number]  Attending Physician Name and Address:  Theodoro Grist, MD  Relative Name and Phone Number:       Current Level of Care: Hospital Recommended Level of Care: [place] Prior Approval Number:    Date Approved/Denied:   PASRR Number:    Discharge Plan: Other (Comment) (Group Home )    Current Diagnoses: Patient Active Problem List   Diagnosis Date Noted  . Acute on chronic renal failure ([place])   . UTI (lower urinary tract infection)   . Weakness   . Cystitis 03/05/2015  . Knee pain 12/21/2014  . Encephalopathy, metabolic 20/35/5974  . Acute delirium 12/17/2014  . Acute respiratory failure with hypoxia ([place]) 12/17/2014  . COPD exacerbation ([place]) 12/17/2014  . Acute bronchitis 12/17/2014  . Pleural effusion 12/17/2014  . Chronic kidney disease (CKD), stage IV (severe) ([place])   . Acute pulmonary edema (HCC)   . Acute on chronic combined systolic and diastolic CHF (congestive heart failure) ([place])   . Hypoxia   . Persistent atrial fibrillation ([place])   . SOB (shortness of breath)   . Wheezing   . Atrial fibrillation with RVR ([place])   . CHF (congestive heart failure) ([place]) 12/08/2014    Orientation RESPIRATION BLADDER Height & Weight    Self  Normal Incontinent '5\' 5"'$  (165.1 cm) 172 lbs.  BEHAVIORAL SYMPTOMS/MOOD NEUROLOGICAL BOWEL NUTRITION STATUS      Incontinent Diet (Diet: DYS 1 )  AMBULATORY STATUS COMMUNICATION OF NEEDS Skin   Limited Assist Verbally Normal                       Personal Care Assistance Level of Assistance  Bathing, Feeding, Dressing Bathing Assistance: Limited  assistance Feeding assistance: Independent Dressing Assistance: Limited assistance     Functional Limitations Info  Sight, Hearing, Speech Sight Info: Adequate Hearing Info: Adequate Speech Info: Adequate    SPECIAL CARE FACTORS FREQUENCY  PT (By licensed PT)     PT Frequency:  (3)              Contractures      Additional Factors Info  Code Status, Insulin Sliding Scale Code Status Info:  (Full Code. )     Insulin Sliding Scale Info:  (Novolog Insulin Injections 3 times daily. )       Current Medications (03/07/2015):  This is the current hospital active medication list Current Facility-Administered Medications  Medication Dose Route Frequency Provider Last Rate Last Dose  . acetaminophen (TYLENOL) tablet 650 mg  650 mg Oral Q6H PRN Gladstone Lighter, MD   650 mg at 03/07/15 1035   Or  . acetaminophen (TYLENOL) suppository 650 mg  650 mg Rectal Q6H PRN Gladstone Lighter, MD      . albuterol (PROVENTIL) (2.5 MG/3ML) 0.083% nebulizer solution 2.5 mg  2.5 mg Nebulization Q6H PRN Gladstone Lighter, MD      . carvedilol (COREG) tablet 3.125 mg  3.125 mg Oral BID Gladstone Lighter, MD   3.125 mg at 03/07/15 0850  . cefTRIAXone (ROCEPHIN) 1 g in dextrose 5 % 50 mL IVPB  1 g Intravenous Q24H Vena Rua, [facility]  1 g at 03/06/15 2017  . cetirizine (ZYRTEC) tablet 10 mg  10 mg Oral Daily Gladstone Lighter, MD   10 mg at 03/07/15 0850  . clobetasol cream (TEMOVATE) 6.29 % 1 application  1 application Topical BID PRN Gladstone Lighter, MD      . Darbepoetin Alfa (ARANESP) injection 25 mcg  25 mcg Subcutaneous Once Theodoro Grist, MD      . dextrose 5 % solution   Intravenous Continuous Theodoro Grist, MD 50 mL/hr at 03/06/15 1037    . diltiazem (CARDIZEM CD) 24 hr capsule 120 mg  120 mg Oral Daily Gladstone Lighter, MD   120 mg at 03/07/15 0849  . docusate sodium (COLACE) capsule 100 mg  100 mg Oral BID Gladstone Lighter, MD   100 mg at 03/07/15 0852  . FLUoxetine (PROZAC)  capsule 10 mg  10 mg Oral Daily Gladstone Lighter, MD   10 mg at 03/07/15 0849  . glipiZIDE (GLUCOTROL) tablet 5 mg  5 mg Oral BID WC Gladstone Lighter, MD   5 mg at 03/07/15 0849  . heparin injection 5,000 Units  5,000 Units Subcutaneous 3 times per day Gladstone Lighter, MD   5,000 Units at 03/07/15 0520  . hydrALAZINE (APRESOLINE) tablet 25 mg  25 mg Oral TID Gladstone Lighter, MD   25 mg at 03/07/15 1035  . insulin aspart (novoLOG) injection 0-5 Units  0-5 Units Subcutaneous QHS Gladstone Lighter, MD   0 Units at 03/05/15 2200  . insulin aspart (novoLOG) injection 0-9 Units  0-9 Units Subcutaneous TID WC Gladstone Lighter, MD   1 Units at 03/06/15 1645  . isosorbide mononitrate (IMDUR) 24 hr tablet 30 mg  30 mg Oral Daily Gladstone Lighter, MD   30 mg at 03/07/15 0849  . levothyroxine (SYNTHROID, LEVOTHROID) tablet 112 mcg  112 mcg Oral QAC breakfast Gladstone Lighter, MD   112 mcg at 03/07/15 0852  . LORazepam (ATIVAN) injection 0.5 mg  0.5 mg Intravenous Q4H PRN Gladstone Lighter, MD   2 mg at 03/06/15 0428  . multivitamin with minerals tablet 1 tablet  1 tablet Oral Daily Gladstone Lighter, MD   1 tablet at 03/07/15 0852  . olopatadine (PATANOL) 0.1 % ophthalmic solution 1 drop  1 drop Both Eyes Daily Gladstone Lighter, MD   1 drop at 03/07/15 1035  . ondansetron (ZOFRAN) tablet 4 mg  4 mg Oral Q6H PRN Gladstone Lighter, MD       Or  . ondansetron (ZOFRAN) injection 4 mg  4 mg Intravenous Q6H PRN Gladstone Lighter, MD      . oxybutynin (DITROPAN) tablet 5 mg  5 mg Oral BID Theodoro Grist, MD   5 mg at 03/07/15 0852  . pantoprazole (PROTONIX) EC tablet 40 mg  40 mg Oral QAC breakfast Gladstone Lighter, MD   40 mg at 03/07/15 0850  . polyethylene glycol (MIRALAX / GLYCOLAX) packet 17 g  17 g Oral Daily PRN Gladstone Lighter, MD      . potassium chloride 10 mEq in 100 mL IVPB  10 mEq Intravenous Q1 Hr x 2 Theodoro Grist, MD      . sodium bicarbonate tablet 650 mg  650 mg Oral BID Gladstone Lighter, MD   650 mg at 03/07/15 0849  . Vitamin D (Ergocalciferol) (DRISDOL) capsule 50,000 Units  50,000 Units Oral Q7 days Gladstone Lighter, MD   50,000 Units at 03/05/15 2000     Discharge Medications: Please see discharge summary for a list of discharge medications.  Relevant  Imaging Results:  Relevant Lab Results:   Additional Information  (SSN: [id number])  Loralyn Freshwater, LCSW

## 2015-03-07 NOTE — Progress Notes (Signed)
[place] [facility] [place] NAME: Paige James    MR#:  [id number]  DATE OF BIRTH:  November 04, 1920  SUBJECTIVE:  CHIEF COMPLAINT:   Chief Complaint  Patient presents with  . Back Pain  . Difficulty Walking   Patient is a 82 year old African-American female who presents to the hospital with back pain and difficulty walking. Patient is confused and restless even combative, unable to review of systems. Emergency room, patient was noted to be in acute on chronic renal failure with creatinine level of 3.15, baseline being around 2. Urinalysis was concerning for urinary tract infection, she was admitted for antibiotic infusion, now on Rocephin. Patient is agitated today, screaming, patent was noted not to move her right knee, would be in questionable pain whenever right lower extremity is moved passively, although difficult to evaluate. Unable to review of systems.   ,   Review of Systems  Unable to perform ROS: dementia    VITAL SIGNS: Blood pressure 117/67, pulse 78, temperature 97.8 F (36.6 C), temperature source Axillary, resp. rate 17, height '5\' 4"'$  (1.626 m), weight 78.019 kg (172 lb), SpO2 85 %.  PHYSICAL EXAMINATION:   GENERAL:  82 y.o.-year-old patient lying in the in moderate  Distress, uncomfortable when moved or touched, intermittently agitated and screams but much more alert today, nonverbal EYES: Pupils equal, round, reactive to light and accommodation. No scleral icterus. Extraocular muscles intact.  HEENT: Head atraumatic, normocephalic. Oropharynx and nasopharynx clear.  NECK:  Supple, no jugular venous distention. No thyroid enlargement, no tenderness.  LUNGS: Normal breath sounds bilaterally, no wheezing, rales,rhonchi or crepitation. No use of accessory muscles of respiration.  CARDIOVASCULAR: S1, S2 normal. No murmurs, rubs, or gallops.  ABDOMEN: Soft, Uncomfortable on palpation diffusely, examination is limited as patient is  pushing examiner away, nondistended. Bowel sounds present. No organomegaly or mass.  EXTREMITIES: No pedal edema, cyanosis, or clubbing. right upper extremity swelling  NEUROLOGIC: Cranial nerves II through XII are intact. Muscle strength unable to evaluate due to agitation, although decreased range of movements in the right knee. Sensation not able to evaluate. Gait not checked.  PSYCHIATRIC: The patient is alert ,  agitated .  SKIN: No obvious rash, lesion, or ulcer.   ORDERS/RESULTS REVIEWED:   CBC  Recent Labs Lab 03/05/15 1137 03/06/15 0401 03/07/15 0509  WBC 8.6 7.9 6.7  HGB 9.2* 8.2* 7.4*  HCT 28.7* 25.6* 22.7*  PLT 250 252 223  MCV 95.9 97.0 95.8  MCH 30.7 31.1 31.2  MCHC 32.0 32.1 32.6  RDW 16.7* 17.0* 16.6*  LYMPHSABS 1.5  --   --   MONOABS 0.6  --   --   EOSABS 0.1  --   --   BASOSABS 0.0  --   --    ------------------------------------------------------------------------------------------------------------------  Chemistries   Recent Labs Lab 03/05/15 1137 03/06/15 0401 03/07/15 0509  NA 144 146* 144  K 3.2* 3.7 3.0*  CL 100* 104 102  CO2 30 30 33*  GLUCOSE 162* 137* 167*  BUN 64* 66* 70*  CREATININE 3.41* 3.15* 3.00*  CALCIUM 8.1* 7.8* 7.7*  AST 44*  --   --   ALT 22  --   --   ALKPHOS 96  --   --   BILITOT 0.9  --   --    ------------------------------------------------------------------------------------------------------------------ estimated creatinine clearance is 12.1 mL/min (by C-G formula based on Cr of 3). ------------------------------------------------------------------------------------------------------------------ No results for input(s): TSH, T4TOTAL, T3FREE, THYROIDAB in the last  72 hours.  Invalid input(s): FREET3  Cardiac Enzymes No results for input(s): CKMB, TROPONINI, MYOGLOBIN in the last 168 hours.  Invalid input(s):  CK ------------------------------------------------------------------------------------------------------------------ Invalid input(s): POCBNP ---------------------------------------------------------------------------------------------------------------  RADIOLOGY: Dg Chest 2 View  03/05/2015  CLINICAL DATA:  Patient with altered mental status. EXAM: CHEST  2 VIEW COMPARISON:  Chest radiograph 12/21/2014. FINDINGS: Stable enlarged cardiac and mediastinal contours. Interval development of heterogeneous opacities within the lung bases bilaterally. Re- demonstrated nodular pleural parenchymal thickening within the right lung apex. Small bilateral pleural effusions. No pneumothorax. Thoracic spine degenerative changes. IMPRESSION: Small bilateral layering pleural effusions with underlying opacities favored to represent atelectasis. Electronically Signed   By: Lovey Newcomer M.D.   On: 03/05/2015 12:42   US Renal  03/06/2015  CLINICAL DATA:  Patient with acute on chronic renal failure. EXAM: RENAL / URINARY TRACT ULTRASOUND COMPLETE COMPARISON:  Renal ultrasound 12/09/2014. FINDINGS: Right Kidney: Length: 8.4 cm. Diffusely increased renal cortical echogenicity. Normal renal cortical thickness. No hydronephrosis. Left Kidney: Length: 8.6 cm. Diffusely increased renal cortical echogenicity. Normal renal cortical thickness. No hydronephrosis. Within the interpolar region of the left kidney there is suggestion a 5 mm echogenic focus. Bladder: Appears normal for degree of bladder distention. IMPRESSION: Diffusely increased renal cortical echogenicity as can be seen with chronic medical renal disease. Probable 5 mm stone within the interpolar region of the left kidney. Electronically Signed   By: Lovey Newcomer M.D.   On: 03/06/2015 14:23   US Venous Img Upper Uni Right  03/05/2015  CLINICAL DATA:  Upper extremity swelling on the right of unknown duration, patient with dementia EXAM: Right UPPER EXTREMITY VENOUS DOPPLER  ULTRASOUND TECHNIQUE: Gray-scale sonography with graded compression, as well as color Doppler and duplex ultrasound were performed to evaluate the upper extremity deep venous system from the level of the subclavian vein and including the jugular, axillary, basilic, radial, ulnar and upper cephalic vein. Spectral Doppler was utilized to evaluate flow at rest and with distal augmentation maneuvers. COMPARISON:  None. FINDINGS: Contralateral Subclavian Vein: Respiratory phasicity is normal and symmetric with the symptomatic side. No evidence of thrombus. Normal compressibility. Internal Jugular Vein: No evidence of thrombus. Normal compressibility, respiratory phasicity and response to augmentation. Subclavian Vein: No evidence of thrombus. Normal compressibility, respiratory phasicity and response to augmentation. Axillary Vein: No evidence of thrombus. Normal compressibility, respiratory phasicity and response to augmentation. Cephalic Vein: No evidence of thrombus. Normal compressibility, respiratory phasicity and response to augmentation. Basilic Vein: No evidence of thrombus. Normal compressibility, respiratory phasicity and response to augmentation. Brachial Veins: No evidence of thrombus. Normal compressibility, respiratory phasicity and response to augmentation. Radial Veins: No evidence of thrombus. Normal compressibility, respiratory phasicity and response to augmentation. Ulnar Veins: No evidence of thrombus. Normal compressibility, respiratory phasicity and response to augmentation. Venous Reflux:  None visualized. Other Findings:  Severe right upper extremity soft tissue edema. IMPRESSION: No evidence of deep venous thrombosis. Electronically Signed   By: Skipper Cliche M.D.   On: 03/05/2015 19:33   Dg Chest Port 1 View  03/06/2015  CLINICAL DATA:  Central line placement. EXAM: PORTABLE CHEST 1 VIEW COMPARISON:  03/05/2015 FINDINGS: Left subclavian approach central venous line placement is seen, with  tip terminating at the expected location of the right atrium. Cardiomediastinal silhouette is enlarged. Mediastinal contours appear intact. There is no evidence of pneumothorax. Bibasilar opacities likely represent pleural effusions with associated subsegmental atelectasis. Osseous structures are without acute abnormality. Soft tissues are grossly normal. IMPRESSION: Status post central venous line  placement without evidence of pneumothorax. Persistent bibasilar airspace opacities likely representing layering pleural effusions and associated subsegmental atelectasis. Electronically Signed   By: Fidela Salisbury M.D.   On: 03/06/2015 17:52   Dg Foot Complete Right  03/05/2015  CLINICAL DATA:  Generalized weakness. EXAM: RIGHT FOOT COMPLETE - 3+ VIEW COMPARISON:  None. FINDINGS: There is no evidence of fracture or dislocation. There is no evidence of arthropathy or other focal bone abnormality. Soft tissues are unremarkable. IMPRESSION: Negative. Electronically Signed   By: Franchot Gallo M.D.   On: 03/05/2015 12:42    EKG:  Orders placed or performed during the hospital encounter of 12/08/14  . EKG 12-Lead  . EKG 12-Lead  . ED EKG  . ED EKG  . EKG    ASSESSMENT AND PLAN:  Active Problems:   Cystitis   Acute on chronic renal failure (HCC)   UTI (lower urinary tract infection)   Weakness 1. Acute pyelonephritis due to gram-negative rods, continue patient on Rocephin. Following culture results. Blood cultures are negative so far  2. Acute on chronic renal failure , continue patient on IV fluids and improved creatinine today, kidney ultrasound revealed increased renal cortical echogenicity and 5 mm stone in her left kidney, no hydronephrosis. Follow creatinine in the morning  3. Hypernatremia , resolved, continue D5W, follow sodium levels intermittently 4. Anemia of chronic disease, one dose of Neupogen, follow hemoglobin level intermittently, blood pressure remains stable 5. Metabolic  encephalopathy in the setting of dementia, likely infection related, supportive therapy for now, some more alert today, follow clinically, may need to administer medications to decrease agitation 6. Right knee pain, x-ray revealed a severe degenerative joint disease, no acute findings    Management plans discussed with the patient, family and they are in agreement.   DRUG ALLERGIES: No Known Allergies  CODE STATUS:     Code Status Orders        Start     Ordered   03/05/15 1700  Full code   Continuous     03/05/15 1659      TOTAL TIME TAKING CARE OF THIS PATIENT: 30 minutes.    Theodoro Grist M.D on 03/07/2015 at 10:40 AM  Between 7am to 6pm - Pager - [phone]  After 6pm go to [email or web address] - password [email or web address] [place] [facility]  Office  [phone]  CC: Primary care physician; Pcp Not In System

## 2015-03-07 NOTE — Progress Notes (Signed)
PT Cancellation Note  Patient Details Name: Paige James MRN: [id number] DOB: 09-Dec-1920   Cancelled Treatment:    Reason Eval/Treat Not Completed: Patient's level of consciousness Attempted to see pt again today. She had mitts on and seemed very confused.  She was not able to make eye contact though she did appear to vaguely acknowledge that PT was in the room talking with her.  Pt unable to participate, will try back one more time to see if she is appropriate? Wayne Both, PT, DPT [phone]   Kreg Shropshire 03/07/2015, 11:40 AM

## 2015-03-08 LAB — CBC
HCT: 22.3 % — ABNORMAL LOW (ref 35.0–47.0)
HEMOGLOBIN: 7.3 g/dL — AB (ref 12.0–16.0)
MCH: 31.1 pg (ref 26.0–34.0)
MCHC: 32.6 g/dL (ref 32.0–36.0)
MCV: 95.3 fL (ref 80.0–100.0)
PLATELETS: 215 10*3/uL (ref 150–440)
RBC: 2.34 MIL/uL — AB (ref 3.80–5.20)
RDW: 16.3 % — ABNORMAL HIGH (ref 11.5–14.5)
WBC: 6.8 10*3/uL (ref 3.6–11.0)

## 2015-03-08 LAB — BASIC METABOLIC PANEL
Anion gap: 7 (ref 5–15)
BUN: 68 mg/dL — AB (ref 6–20)
CALCIUM: 7.5 mg/dL — AB (ref 8.9–10.3)
CO2: 31 mmol/L (ref 22–32)
CREATININE: 2.68 mg/dL — AB (ref 0.44–1.00)
Chloride: 100 mmol/L — ABNORMAL LOW (ref 101–111)
GFR calc non Af Amer: 14 mL/min — ABNORMAL LOW (ref >60)
GFR, EST AFRICAN AMERICAN: 17 mL/min — AB (ref >60)
Glucose, Bld: 199 mg/dL — ABNORMAL HIGH (ref 65–99)
Potassium: 3.3 mmol/L — ABNORMAL LOW (ref 3.5–5.1)
SODIUM: 138 mmol/L (ref 135–145)

## 2015-03-08 LAB — GLUCOSE, CAPILLARY
GLUCOSE-CAPILLARY: 194 mg/dL — AB (ref 65–99)
GLUCOSE-CAPILLARY: 196 mg/dL — AB (ref 65–99)
Glucose-Capillary: 234 mg/dL — ABNORMAL HIGH (ref 65–99)
Glucose-Capillary: 243 mg/dL — ABNORMAL HIGH (ref 65–99)

## 2015-03-08 MED ORDER — POTASSIUM CHLORIDE 10 MEQ/100ML IV SOLN
10.0000 meq | INTRAVENOUS | Status: AC
  Administered 2015-03-08 (×4): 10 meq via INTRAVENOUS
  Filled 2015-03-08 (×4): qty 100

## 2015-03-08 MED ORDER — SODIUM CHLORIDE 0.9 % IV SOLN
500.0000 mg | INTRAVENOUS | Status: DC
  Administered 2015-03-08 – 2015-03-09 (×2): 0.5 g via INTRAVENOUS
  Filled 2015-03-08 (×2): qty 0.5

## 2015-03-08 NOTE — Care Management Important Message (Signed)
Important Message  Patient Details  Name: Paige James MRN: [id number] Date of Birth: June 01, 1920   Medicare Important Message Given:  Yes    Marshell Garfinkel, RN 03/08/2015, 1:53 PM

## 2015-03-08 NOTE — Evaluation (Signed)
Physical Therapy Evaluation Patient Details Name: Paige James MRN: [id number] DOB: [birth year] Today's Date: 03/08/2015   History of Present Illness  Pt is a 82 y.o. female presenting to hospital with back pain, difficulty walking, and worsening confusion.  Pt admitted to hospital with acute cystitis, ARF on CKD stage 4, and metabolic encephalopathy.  R UE [place] negative for DVT.  R foot imaging negative for fx.  No acute findings R knee on imaging (severe degenerative joint disease).  Pt at hospital about 2 months ago with L knee pain and difficulty walking and also just prior to that with AMS, hypoxia, and new onset a-fib.  PMH includes dementia, systolic CHF with EF 70%, a-fib, OA, metastatic breast CA s/p R mastectomy with chemotherapy and radiation, B TKR, hip surgery, distal R common iliac artery aneurysm.  Clinical Impression  Prior to admission, per notes pt was ambulating with a walker.  Pt lives at a family care home.  Currently pt is 2 assist with bed mobility and 1/2 stand with RW.  Therapy complicated d/t c/o pain in general, cognition, R lateral lean, and general weakness.  Pt would benefit from skilled PT to address noted impairments and functional limitations.  Recommend pt discharge to STR when medically appropriate.     Follow Up Recommendations SNF    Equipment Recommendations       Recommendations for Other Services       Precautions / Restrictions Precautions Precautions: Fall Precaution Comments: Aspiration Restrictions Weight Bearing Restrictions: No      Mobility  Bed Mobility Overal bed mobility: Needs Assistance;+2 for physical assistance Bed Mobility: Supine to Sit;Sit to Supine     Supine to sit: Max assist;+2 for physical assistance Sit to supine: Max assist;+2 for physical assistance   General bed mobility comments: assist for trunk and B LE's; vc's for technique required  Transfers Overall transfer level: Needs assistance Equipment used: Rolling  walker (2 wheeled) Transfers: Sit to/from Stand Sit to Stand: Max assist;+2 physical assistance         General transfer comment: pt required vc's for hand and feet placement; pt only able to come to 1/2 stand with 2 assist  Ambulation/Gait             General Gait Details: not appropriate at this time  Stairs            Wheelchair Mobility    Modified Rankin (Stroke Patients Only)       Balance Overall balance assessment: Needs assistance Sitting-balance support: Bilateral upper extremity supported;Feet supported Sitting balance-Leahy Scale: Poor Sitting balance - Comments: R lateral lean in bed and also in sitting; max assist to maintain upright sitting on edge of bed Postural control: Right lateral lean     Standing balance comment: pt unable to come to full stand                             Pertinent Vitals/Pain Pain Assessment: Faces Faces Pain Scale: Hurts whole lot Pain Location: touch of B feet Pain Descriptors / Indicators: Grimacing;Guarding;Other (Comment) (yelling out in pain to touch or in anticipation of touch) Pain Intervention(s): Limited activity within patient's tolerance;Monitored during session;Premedicated before session;Repositioned  Vitals stable and WFL throughout treatment session.    Home Living Family/patient expects to be discharged to:: Other (Comment) [facility])                 Additional Comments: Pt  reports not having an AD (although per notes pt walks with a walker)    Prior Function Level of [place]:  (Unable to determine)         Comments: Pt reports ambulating without AD but has walker per notes     Hand Dominance        Extremity/Trunk Assessment   Upper Extremity Assessment: Difficult to assess due to impaired cognition           Lower Extremity Assessment: Difficult to assess due to impaired cognition (Unable to fully extend R knee d/t c/o pain (able to get to 40  degrees short of neutral))         Communication   Communication: No difficulties  Cognition Arousal/Alertness: Awake/alert Behavior During Therapy: Impulsive Overall Cognitive Status:  (Oriented to name (not DOB, time, place, situation))       Memory: Decreased recall of precautions              General Comments General comments (skin integrity, edema, etc.): R UE edema noted; dressing noted posterior R leg near knee  Nursing cleared pt for participation in physical therapy.  Pt agreeable to trying to get to chair.  Nursing cleared pt to not have mitt on while eating breakfast end of PT session (nursing would check on pt in a little bit).    Exercises  Significant extra time and cueing required for bed mobility and transfer training.     Assessment/Plan    PT Assessment Patient needs continued PT services  PT Diagnosis Difficulty walking;Generalized weakness;Acute pain   PT Problem List Decreased strength;Decreased range of motion;Decreased activity tolerance;Decreased balance;Decreased mobility;Decreased cognition;Decreased knowledge of use of DME;Decreased safety awareness;Decreased knowledge of precautions;Pain  PT Treatment Interventions DME instruction;Gait training;Stair training;Functional mobility training;Therapeutic activities;Therapeutic exercise;Balance training;Cognitive remediation;Patient/family education   PT Goals (Current goals can be found in the Care Plan section) Acute Rehab PT Goals Patient Stated Goal: to not have pain PT Goal Formulation: With patient Time For Goal Achievement: 03/22/15 Potential to Achieve Goals: Fair    Frequency Min 2X/week   Barriers to discharge Decreased caregiver support      Co-evaluation               End of Session Equipment Utilized During Treatment: Gait belt Activity Tolerance: Patient limited by pain Patient left: in bed;with call bell/phone within reach;with bed alarm set;with SCD's reapplied Nurse  Communication: Mobility status;Precautions         Time: 1005-1045 PT Time Calculation (min) (ACUTE ONLY): 40 min   Charges:   PT Evaluation $PT Eval Moderate Complexity: 1 Procedure PT Treatments $Therapeutic Activity: 8-22 mins   PT G CodesLeitha Bleak 03/16/15, 11:08 AM Leitha Bleak, [place]

## 2015-03-08 NOTE — Progress Notes (Signed)
Pharmacy Antibiotic Follow-up Note  Paige James is a 82 y.o. year-old female admitted on 03/05/2015.  The patient is currently on day 1 of ertapenem for ESBL UTI.  Assessment/Plan: Urine culture results 1/9 indicate ESBL UTI. Spoke with Dr. Clayton Bibles, ceftriaxone changed to ertapenem '500mg'$  IV q24hrs due to renal function.  Temp (24hrs), Avg:97.8 F (36.6 C), Min:97.5 F (36.4 C), Max:98 F (36.7 C)   Recent Labs Lab 03/05/15 1137 03/06/15 0401 03/07/15 0509 03/08/15 0407  WBC 8.6 7.9 6.7 6.8    Recent Labs Lab 03/05/15 1137 03/06/15 0401 03/07/15 0509  CREATININE 3.41* 3.15* 3.00*   Estimated Creatinine Clearance: 12.1 mL/min (by C-G formula based on Cr of 3).    No Known Allergies  Antimicrobials this admission: Ceftriaxone 1g  1/6 >> 1/9 Ertapenem '500mg'$   1/9 >>    Microbiology results: 1/6 BCx: 2 out of 2 NGTD 1/6 UCx: ESBL E. coli  Thank you for allowing pharmacy to be a part of this patient's care.  Roe Coombs, PharmD Pharmacy Resident 03/08/2015
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
## 2015-03-08 NOTE — Progress Notes (Signed)
Speech Therapy Note: received call from NSG reporting pt was not tolerating the thin liquids this morning. Unsure if d/t pt's Cognitive status at the time. Will f/u w/ pt's toleration of diet including thin liquids tomorrow. In the meantime, diet modified to a Nectar liquids consistency diet for safety. NSG updated.

## 2015-03-08 NOTE — Progress Notes (Signed)
[place] at [place] NAME: Paige James    MR#:  [id number]  DATE OF BIRTH:  05-31-1920  SUBJECTIVE:  CHIEF COMPLAINT:   Chief Complaint  Patient presents with  . Back Pain  . Difficulty Walking   Patient is a 82 year old African-American female who presents to the hospital with back pain and difficulty walking. Patient is confused and restless even combative, unable to review of systems. Emergency room, patient was noted to be in acute on chronic renal failure with creatinine level of 3.15, baseline being around 2. Urinalysis was concerning for urinary tract infection, she was admitted for antibiotic infusion, now on Rocephin. Patient remains agitated intermittently, screams. Unable to review systems. Patient's urine cultures came back ESBL bacteria, Rocephin and changed to ertapenem. Physical therapy evaluated patient and recommended skilled nursing facility placement.   Review of Systems  Unable to perform ROS: dementia    VITAL SIGNS: Blood pressure 119/63, pulse 99, temperature 98 F (36.7 C), temperature source Oral, resp. rate 19, height '5\' 4"'$  (1.626 m), weight 78.019 kg (172 lb), SpO2 96 %.  PHYSICAL EXAMINATION:   GENERAL:  82 y.o.-year-old patient lying in the in moderate  Distress, uncomfortable when moved or touched, intermittently agitated and screams ,  alert , mumbles by herself, does not follow commands EYES: Pupils equal, round, reactive to light and accommodation. No scleral icterus. Extraocular muscles intact.  HEENT: Head atraumatic, normocephalic. Oropharynx and nasopharynx clear.  NECK:  Supple, no jugular venous distention. No thyroid enlargement, no tenderness.  LUNGS: Normal breath sounds bilaterally, no wheezing, rales,rhonchi or crepitation. No use of accessory muscles of respiration.  CARDIOVASCULAR: S1, S2 normal. No murmurs, rubs, or gallops.  ABDOMEN: Soft, Uncomfortable on palpation diffusely,  examination is limited as patient is pushing examiner away, nondistended. Bowel sounds present. No organomegaly or mass.  EXTREMITIES: No pedal edema, cyanosis, or clubbing. right upper extremity swelling  NEUROLOGIC: Cranial nerves II through XII are intact. Muscle strength unable to evaluate due to agitation, although decreased range of movements in the right knee. Sensation not able to evaluate. Gait not checked.  PSYCHIATRIC: The patient is alert ,  agitated .  SKIN: No obvious rash, lesion, or ulcer.   ORDERS/RESULTS REVIEWED:   CBC  Recent Labs Lab 03/05/15 1137 03/06/15 0401 03/07/15 0509 03/08/15 0407  WBC 8.6 7.9 6.7 6.8  HGB 9.2* 8.2* 7.4* 7.3*  HCT 28.7* 25.6* 22.7* 22.3*  PLT 250 252 223 215  MCV 95.9 97.0 95.8 95.3  MCH 30.7 31.1 31.2 31.1  MCHC 32.0 32.1 32.6 32.6  RDW 16.7* 17.0* 16.6* 16.3*  LYMPHSABS 1.5  --   --   --   MONOABS 0.6  --   --   --   EOSABS 0.1  --   --   --   BASOSABS 0.0  --   --   --    ------------------------------------------------------------------------------------------------------------------  Chemistries   Recent Labs Lab 03/05/15 1137 03/06/15 0401 03/07/15 0509  NA 144 146* 144  K 3.2* 3.7 3.0*  CL 100* 104 102  CO2 30 30 33*  GLUCOSE 162* 137* 167*  BUN 64* 66* 70*  CREATININE 3.41* 3.15* 3.00*  CALCIUM 8.1* 7.8* 7.7*  AST 44*  --   --   ALT 22  --   --   ALKPHOS 96  --   --   BILITOT 0.9  --   --    ------------------------------------------------------------------------------------------------------------------ estimated creatinine clearance is 12.1 mL/min (  by C-G formula based on Cr of 3). ------------------------------------------------------------------------------------------------------------------ No results for input(s): TSH, T4TOTAL, T3FREE, THYROIDAB in the last 72 hours.  Invalid input(s): FREET3  Cardiac Enzymes No results for input(s): CKMB, TROPONINI, MYOGLOBIN in the last 168 hours.  Invalid  input(s): CK ------------------------------------------------------------------------------------------------------------------ Invalid input(s): POCBNP ---------------------------------------------------------------------------------------------------------------  RADIOLOGY: Dg Knee 1-2 Views Right  03/07/2015  CLINICAL DATA:  Right knee pain.  Right knee surgery in 2008 (? ). EXAM: RIGHT KNEE - 1-2 VIEW COMPARISON:  None. FINDINGS: There is plate and screw fixation hardware of the distal right femur. There are severe tricompartmental degenerative changes of the right knee, with complete or near complete joint space loss throughout, with abutment of the femoral condyles and tibial plateau. Associated degenerative osteophyte formation. No acute- appearing cortical irregularity or osseous lesion. No acute fracture line or displaced fracture fragment. No appreciable joint effusion. Extensive atherosclerotic vascular calcifications seen posterior to the knee joint. IMPRESSION: 1. Severe tricompartmental DJD, with complete or near complete joint space loss throughout, with abutment of the femoral condyles and tibial plateau. 2. Plate and screw fixation hardware appears grossly intact and well positioned within the distal right femur. 3. No acute findings. Electronically Signed   By: Franki Cabot M.D.   On: 03/07/2015 10:39   Dg Chest Port 1 View  03/06/2015  CLINICAL DATA:  Central line placement. EXAM: PORTABLE CHEST 1 VIEW COMPARISON:  03/05/2015 FINDINGS: Left subclavian approach central venous line placement is seen, with tip terminating at the expected location of the right atrium. Cardiomediastinal silhouette is enlarged. Mediastinal contours appear intact. There is no evidence of pneumothorax. Bibasilar opacities likely represent pleural effusions with associated subsegmental atelectasis. Osseous structures are without acute abnormality. Soft tissues are grossly normal. IMPRESSION: Status post central  venous line placement without evidence of pneumothorax. Persistent bibasilar airspace opacities likely representing layering pleural effusions and associated subsegmental atelectasis. Electronically Signed   By: Fidela Salisbury M.D.   On: 03/06/2015 17:52    EKG:  Orders placed or performed during the hospital encounter of 12/08/14  . EKG 12-Lead  . EKG 12-Lead  . ED EKG  . ED EKG  . EKG    ASSESSMENT AND PLAN:  Active Problems:   Cystitis   Acute on chronic renal failure (HCC)   UTI (lower urinary tract infection)   Weakness 1. Acute pyelonephritis due to Escherichia coli ESBL , change Rocephin to ertapenem.  Blood cultures are negative so far . Following clinically possibly placing PICC line and discharge to skilled nursing facility for prolonged ertapenem infusion.  2. Acute on chronic renal failure , continue patient on IV fluids and follow creatinine today, kidney ultrasound revealed increased renal cortical echogenicity and 5 mm stone in her left kidney, no hydronephrosis. Follow creatinine daily. Appreciate nephrologist input  3. Hypernatremia , continue D5 half normal saline, follow sodium levels intermittently 4. Anemia of chronic disease, patient was given one dose of Neupogen yesterday, follow hemoglobin level intermittently, blood pressure remains stable 5. Metabolic encephalopathy due to infection in the setting of dementia,  supportive therapy for now, some more alert  Overall since admission, follow clinically, administer medications for agitation if needed 6. 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   DRUG ALLERGIES: No Known Allergies  CODE STATUS:     Code Status Orders  Start     Ordered   03/05/15 1700  Full code   Continuous     03/05/15 1659      TOTAL TIME  TAKING CARE OF THIS PATIENT: 30 minutes.    Theodoro Grist M.D on 03/08/2015 at 2:24 PM  Between 7am to 6pm - Pager - [phone]  After 6pm go to [email or web address] - password [email or web address] [facility]  Office  [phone]  CC: Primary care physician; Pcp Not In System

## 2015-03-09 LAB — GLUCOSE, CAPILLARY
GLUCOSE-CAPILLARY: 149 mg/dL — AB (ref 65–99)
GLUCOSE-CAPILLARY: 172 mg/dL — AB (ref 65–99)
GLUCOSE-CAPILLARY: 175 mg/dL — AB (ref 65–99)
Glucose-Capillary: 227 mg/dL — ABNORMAL HIGH (ref 65–99)

## 2015-03-09 LAB — HEMOGLOBIN: Hemoglobin: 7 g/dL — ABNORMAL LOW (ref 12.0–16.0)

## 2015-03-09 LAB — URINE CULTURE

## 2015-03-09 LAB — BASIC METABOLIC PANEL
ANION GAP: 8 (ref 5–15)
BUN: 66 mg/dL — ABNORMAL HIGH (ref 6–20)
CHLORIDE: 100 mmol/L — AB (ref 101–111)
CO2: 29 mmol/L (ref 22–32)
Calcium: 7.6 mg/dL — ABNORMAL LOW (ref 8.9–10.3)
Creatinine, Ser: 2.41 mg/dL — ABNORMAL HIGH (ref 0.44–1.00)
GFR calc non Af Amer: 16 mL/min — ABNORMAL LOW (ref >60)
GFR, EST AFRICAN AMERICAN: 19 mL/min — AB (ref >60)
GLUCOSE: 238 mg/dL — AB (ref 65–99)
POTASSIUM: 4 mmol/L (ref 3.5–5.1)
Sodium: 137 mmol/L (ref 135–145)

## 2015-03-09 MED ORDER — CEPHALEXIN 250 MG/5ML PO SUSR
250.0000 mg | Freq: Two times a day (BID) | ORAL | Status: DC
  Administered 2015-03-09 – 2015-03-10 (×3): 250 mg via ORAL
  Filled 2015-03-09 (×7): qty 5

## 2015-03-09 NOTE — Progress Notes (Signed)
Nutrition Follow-up   INTERVENTION:   Meals and Snacks: Cater to patient preferences, SLP following noted down grade in diet order to Dysphagia I, Nectar thick liquids Medical Food Supplement Therapy: will increase Mighty Shakes to TID and continue Magic Cup BID Coordination of Care: will recommend collecting new weight   NUTRITION DIAGNOSIS:   Inadequate oral intake related to lethargy/confusion, acute illness as evidenced by meal completion < 25%.  GOAL:   Patient will meet greater than or equal to 90% of their needs  MONITOR:    (Energy Intake, Anthropometrics, Digestive System, Electrolyte/Renal Profile, Cognition)  REASON FOR ASSESSMENT:   Malnutrition Screening Tool    ASSESSMENT:  Nephrology consulted s/p renal ultrasound and acute on chronic renal failure. Pt to receive blood transfusion s/p consent from DSS. Pt remains confused with h/o dementia.   Diet Order:  DIET - DYS 1 Room service appropriate?: Yes; Fluid consistency:: Nectar Thick    Current Nutrition: Pt eating 25% of breakfast this am. Recorded po intake 29% of meals recorded on 1/8. Limited documentation as pt on isolation. RD noted SLP downgraded liquids to nectar late yesterday.   Gastrointestinal Profile: Last BM: 03/09/2015   Scheduled Medications:  . carvedilol  3.125 mg Oral BID  . cephALEXin  250 mg Oral Q12H  . cetirizine  10 mg Oral Daily  . diltiazem  120 mg Oral Daily  . docusate sodium  100 mg Oral BID  . FLUoxetine  10 mg Oral Daily  . glipiZIDE  5 mg Oral BID WC  . heparin  5,000 Units Subcutaneous 3 times per day  . hydrALAZINE  25 mg Oral TID  . insulin aspart  0-5 Units Subcutaneous QHS  . insulin aspart  0-9 Units Subcutaneous TID WC  . isosorbide mononitrate  30 mg Oral Daily  . levothyroxine  112 mcg Oral QAC breakfast  . multivitamin with minerals  1 tablet Oral Daily  . olopatadine  1 drop Both Eyes Daily  . oxybutynin  5 mg Oral BID  . pantoprazole  40 mg Oral QAC  breakfast  . sodium bicarbonate  650 mg Oral BID  . Vitamin D (Ergocalciferol)  50,000 Units Oral Q7 days    Continuous Medications:  . dextrose 5 % and 0.45% NaCl 125 mL/hr at 03/09/15 1507   D5 infusion providing 510kcals in 24 hours   Electrolyte/Renal Profile and Glucose Profile:   Recent Labs Lab 03/07/15 0509 03/08/15 0407 03/09/15 0458  NA 144 138 137  K 3.0* 3.3* 4.0  CL 102 100* 100*  CO2 33* 31 29  BUN 70* 68* 66*  CREATININE 3.00* 2.68* 2.41*  CALCIUM 7.7* 7.5* 7.6*  GLUCOSE 167* 199* 238*   Protein Profile:  Recent Labs Lab 03/05/15 1137  ALBUMIN 2.1*     Nutrition-Focused Physical Exam Findings:  Unable to complete Nutrition-Focused physical exam at this time.    Weight Trend since Admission: Filed Weights   03/05/15 1111  Weight: 172 lb (78.019 kg)     Skin:  Reviewed, no issues   BMI:  Body mass index is 29.51 kg/(m^2).  Estimated Nutritional Needs:   Kcal:  1229-1474 kcals (BEE 945, 1.3 AF, 1.0-1.2 IF)   Protein:  55-66 g (1.0-1.2 g/kg)   Fluid:  1375-1650 mL (25-30 ml/kg)    HIGH Care Level  Dwyane Luo, RD, LDN Pager [phone] Weekend/On-Call Pager [phone]

## 2015-03-09 NOTE — Progress Notes (Addendum)
Clinical Education officer, museum ([place]) received call from patient's [place] guardian Barrie Lyme [phone]. Per Barrie Lyme she is patient's guardian. CSW made guardian aware that PT is recommending SNF. Guardian reported that patient was just at [facility] for rehab 2 weeks ago. CSW discussed long term care SNF options with guardian. Guardian is agreeable to SNF search in [facility]. Bed search initiated today. [facility] faxed guardianship paper work to [place]. CSW will place paper work on chart.   Blima Rich, [place] [phone]

## 2015-03-09 NOTE — Clinical Social Work Placement (Signed)
   CLINICAL SOCIAL WORK PLACEMENT  NOTE  Date:  03/09/2015  Patient Details  Name: Paige James MRN: [id number] Date of Birth: 09-Jun-1920  Clinical Social Work is seeking post-discharge placement for this patient at the [place] level of care (*CSW will initial, date and re-position this form in  chart as items are completed):  Yes   Patient/family provided with [facility] Department's list of facilities offering this level of care within the geographic area requested by the patient (or if unable, by the patient's family).  Yes   Patient/family informed of their freedom to choose among providers that offer the needed level of care, that participate in Medicare, Medicaid or managed care program needed by the patient, have an available bed and are willing to accept the patient.  Yes   Patient/family informed of [facility]'s ownership interest in [facility] and [facility], as well as of the fact that they are under no obligation to receive care at these facilities.  PASRR submitted to EDS on       PASRR number received on       Existing PASRR number confirmed on 03/09/15     FL2 transmitted to all facilities in geographic area requested by pt/family on 03/09/15     FL2 transmitted to all facilities within larger geographic area on       Patient informed that his/her managed care company has contracts with or will negotiate with certain facilities, including the following:            Patient/family informed of bed offers received.  Patient chooses bed at       Physician recommends and patient chooses bed at      Patient to be transferred to   on  .  Patient to be transferred to facility by       Patient family notified on   of transfer.  Name of family member notified:        PHYSICIAN       Additional Comment:    _______________________________________________ Loralyn Freshwater, LCSW 03/09/2015, 3:31 PM

## 2015-03-09 NOTE — Progress Notes (Signed)
[place] at [place] NAME: Paige James    MR#:  [id number]  DATE OF BIRTH:  May 19, 1920  SUBJECTIVE:  CHIEF COMPLAINT:   Chief Complaint  Patient presents with  . Back Pain  . Difficulty Walking   Patient is a 82 year old African-American female who presents to the hospital with back pain and difficulty walking. Patient is confused and restless even combative, unable to review of systems. Emergency room, patient was noted to be in acute on chronic renal failure with creatinine level of 3.15, baseline being around 2. Urinalysis was concerning for urinary tract infection, she was admitted for antibiotic infusion, now on Rocephin. Patient remains agitated intermittently, screams. Unable to review systems. Patient's urine cultures came back ESBL bacteria, but apparently that was a mistake and patient's Escherichia coli is sensitive to Rocephin, started on Keflex orally today    Review of Systems  Unable to perform ROS: dementia    VITAL SIGNS: Blood pressure 92/60, pulse 52, temperature 98.5 F (36.9 C), temperature source Oral, resp. rate 18, height '5\' 4"'$  (1.626 m), weight 78.019 kg (172 lb), SpO2 93 %.  PHYSICAL EXAMINATION:   GENERAL:  82 y.o.-year-old patient lying in bed in no significant distress secondary mold, alert and communicative although difficult to understand due to dysarthria, intermittently answers questions EYES: Pupils equal, round, reactive to light and accommodation. No scleral icterus. Extraocular muscles intact.  HEENT: Head atraumatic, normocephalic. Oropharynx and nasopharynx clear.  NECK:  Supple, no jugular venous distention. No thyroid enlargement, no tenderness.  LUNGS: Normal breath sounds bilaterally, no wheezing, rales,rhonchi or crepitation. No use of accessory muscles of respiration.  CARDIOVASCULAR: S1, S2 normal. No murmurs, rubs, or gallops.  ABDOMEN: Soft, Uncomfortable on palpation diffusely,  examination is limited as patient is pushing examiner away, nondistended. Bowel sounds present. No organomegaly or mass.  EXTREMITIES: No pedal edema, cyanosis, or clubbing. right upper extremity swelling  NEUROLOGIC: Cranial nerves II through XII are intact. Muscle strength unable to evaluate due to agitation, although decreased range of movements in the right knee. Sensation not able to evaluate. Gait not checked.  PSYCHIATRIC: The patient is alert ,  periodically agitated when touched or examined.  SKIN: No obvious rash, lesion, or ulcer.   ORDERS/RESULTS REVIEWED:   CBC  Recent Labs Lab 03/05/15 1137 03/06/15 0401 03/07/15 0509 03/08/15 0407 03/09/15 0458  WBC 8.6 7.9 6.7 6.8  --   HGB 9.2* 8.2* 7.4* 7.3* 7.0*  HCT 28.7* 25.6* 22.7* 22.3*  --   PLT 250 252 223 215  --   MCV 95.9 97.0 95.8 95.3  --   MCH 30.7 31.1 31.2 31.1  --   MCHC 32.0 32.1 32.6 32.6  --   RDW 16.7* 17.0* 16.6* 16.3*  --   LYMPHSABS 1.5  --   --   --   --   MONOABS 0.6  --   --   --   --   EOSABS 0.1  --   --   --   --   BASOSABS 0.0  --   --   --   --    ------------------------------------------------------------------------------------------------------------------  Chemistries   Recent Labs Lab 03/05/15 1137 03/06/15 0401 03/07/15 0509 03/08/15 0407 03/09/15 0458  NA 144 146* 144 138 137  K 3.2* 3.7 3.0* 3.3* 4.0  CL 100* 104 102 100* 100*  CO2 30 30 33* 31 29  GLUCOSE 162* 137* 167* 199* 238*  BUN 64* 66* 70* 68*  66*  CREATININE 3.41* 3.15* 3.00* 2.68* 2.41*  CALCIUM 8.1* 7.8* 7.7* 7.5* 7.6*  AST 44*  --   --   --   --   ALT 22  --   --   --   --   ALKPHOS 96  --   --   --   --   BILITOT 0.9  --   --   --   --    ------------------------------------------------------------------------------------------------------------------ estimated creatinine clearance is 15 mL/min (by C-G formula based on Cr of  2.41). ------------------------------------------------------------------------------------------------------------------ No results for input(s): TSH, T4TOTAL, T3FREE, THYROIDAB in the last 72 hours.  Invalid input(s): FREET3  Cardiac Enzymes No results for input(s): CKMB, TROPONINI, MYOGLOBIN in the last 168 hours.  Invalid input(s): CK ------------------------------------------------------------------------------------------------------------------ Invalid input(s): POCBNP ---------------------------------------------------------------------------------------------------------------  RADIOLOGY: No results found.  EKG:  Orders placed or performed during the hospital encounter of 12/08/14  . EKG 12-Lead  . EKG 12-Lead  . ED EKG  . ED EKG  . EKG    ASSESSMENT AND PLAN:  Active Problems:   Cystitis   Acute on chronic renal failure (HCC)   UTI (lower urinary tract infection)   Weakness 1. Acute pyelonephritis due to Escherichia coli , change antibiotic to Keflex orally.   Blood cultures are negative so far . She does not need the PICC line for ertapenem infusion since she has no ESBL infection.  2. Acute on chronic renal failure , continue patient on IV fluids creatinine has been improving, kidney ultrasound revealed increased renal cortical echogenicity and 5 mm stone in her left kidney, no hydronephrosis.  Appreciate nephrologist input  3. Hypernatremia , continue D5 half normal saline, follow sodium levels intermittently, stable now 4. Anemia of chronic disease, patient was given one dose of Neupogen day before yesterday, hemoglobin level is only 7 today, blood pressure remains relatively low and patient would benefit from transfusion, we need to talk to DSS to receive consent for blood transfusion, I attempted to reach social worker to discuss this with her 5. Metabolic encephalopathy due to infection in the setting of dementia,  supportive therapy, , stable 6. 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   DRUG ALLERGIES: No Known Allergies  CODE STATUS:     Code Status Orders        Start     Ordered   03/05/15 1700  Full code   Continuous     03/05/15 1659      TOTAL TIME TAKING CARE OF THIS PATIENT: 30 minutes.    Theodoro Grist M.D on 03/09/2015 at 12:28 PM  Between 7am to 6pm - Pager - [phone]  After 6pm go to [email or web address] - password [email or web address] [facility]  Office  [phone]  CC: Primary care physician; Pcp Not In System

## 2015-03-09 NOTE — NC FL2 (Signed)
[place] LEVEL OF CARE SCREENING TOOL     IDENTIFICATION  Patient Name: Paige James Birthdate: 31-Dec-1920 Sex: female Admission Date (Current Location): 03/05/2015  [place] and [place] Number:  Engineering geologist and Address:  [facility], [address], [place], [place] [address]      Provider Number: [id number]  Attending Physician Name and Address:  Theodoro Grist, MD  Relative Name and Phone Number:       Current Level of Care: Hospital Recommended Level of Care: SNF  Prior Approval Number:    Date Approved/Denied:   PASRR Number:   [id number]   Discharge Plan: SNF    Current Diagnoses: Patient Active Problem List   Diagnosis Date Noted  . Acute on chronic renal failure ([place])   . UTI (lower urinary tract infection)   . Weakness   . Cystitis 03/05/2015  . Knee pain 12/21/2014  . Encephalopathy, metabolic 73/22/0254  . Acute delirium 12/17/2014  . Acute respiratory failure with hypoxia ([place]) 12/17/2014  . COPD exacerbation ([place]) 12/17/2014  . Acute bronchitis 12/17/2014  . Pleural effusion 12/17/2014  . Chronic kidney disease (CKD), stage IV (severe) ([place])   . Acute pulmonary edema (HCC)   . Acute on chronic combined systolic and diastolic CHF (congestive heart failure) ([place])   . Hypoxia   . Persistent atrial fibrillation ([place])   . SOB (shortness of breath)   . Wheezing   . Atrial fibrillation with RVR ([place])   . CHF (congestive heart failure) ([place]) 12/08/2014    Orientation RESPIRATION BLADDER Height & Weight    Self  Normal Incontinent '5\' 5"'$  (165.1 cm) 172 lbs.  BEHAVIORAL SYMPTOMS/MOOD NEUROLOGICAL BOWEL NUTRITION STATUS      Incontinent Diet (Diet: DYS 1 )  AMBULATORY STATUS COMMUNICATION OF NEEDS Skin   Limited Assist Verbally Normal                       Personal Care Assistance Level of Assistance  Bathing, Feeding, Dressing Bathing Assistance: Limited assistance Feeding assistance:  Independent Dressing Assistance: Limited assistance     Functional Limitations Info  Sight, Hearing, Speech Sight Info: Adequate Hearing Info: Adequate Speech Info: Adequate    SPECIAL CARE FACTORS FREQUENCY  PT (By licensed PT)     PT Frequency:  (3)              Contractures      Additional Factors Info  Code Status, Insulin Sliding Scale Code Status Info:  (Full Code. )     Insulin Sliding Scale Info:  (Novolog Insulin Injections 3 times daily. )       Current Medications (03/09/2015):  This is the current hospital active medication list Current Facility-Administered Medications  Medication Dose Route Frequency Provider Last Rate Last Dose  . acetaminophen (TYLENOL) tablet 650 mg  650 mg Oral Q6H PRN Gladstone Lighter, MD   650 mg at 03/08/15 1003   Or  . acetaminophen (TYLENOL) suppository 650 mg  650 mg Rectal Q6H PRN Gladstone Lighter, MD      . albuterol (PROVENTIL) (2.5 MG/3ML) 0.083% nebulizer solution 2.5 mg  2.5 mg Nebulization Q6H PRN Gladstone Lighter, MD      . carvedilol (COREG) tablet 3.125 mg  3.125 mg Oral BID Gladstone Lighter, MD   3.125 mg at 03/08/15 2207  . cephALEXin (KEFLEX) 250 MG/5ML suspension 250 mg  250 mg Oral Q12H Theodoro Grist, MD   250 mg at 03/09/15 1317  .  cetirizine (ZYRTEC) tablet 10 mg  10 mg Oral Daily Gladstone Lighter, MD   10 mg at 03/09/15 0837  . clobetasol cream (TEMOVATE) 9.03 % 1 application  1 application Topical BID PRN Gladstone Lighter, MD      . dextrose 5 %-0.45 % sodium chloride infusion   Intravenous Continuous Theodoro Grist, MD 125 mL/hr at 03/09/15 1507    . diltiazem (CARDIZEM CD) 24 hr capsule 120 mg  120 mg Oral Daily Gladstone Lighter, MD   120 mg at 03/08/15 1003  . docusate sodium (COLACE) capsule 100 mg  100 mg Oral BID Gladstone Lighter, MD   100 mg at 03/09/15 0836  . FLUoxetine (PROZAC) capsule 10 mg  10 mg Oral Daily Gladstone Lighter, MD   10 mg at 03/09/15 0836  . glipiZIDE (GLUCOTROL) tablet 5 mg  5  mg Oral BID WC Gladstone Lighter, MD   5 mg at 03/09/15 0835  . heparin injection 5,000 Units  5,000 Units Subcutaneous 3 times per day Gladstone Lighter, MD   5,000 Units at 03/09/15 1317  . hydrALAZINE (APRESOLINE) tablet 25 mg  25 mg Oral TID Gladstone Lighter, MD   25 mg at 03/08/15 2207  . insulin aspart (novoLOG) injection 0-5 Units  0-5 Units Subcutaneous QHS Gladstone Lighter, MD   2 Units at 03/07/15 2334  . insulin aspart (novoLOG) injection 0-9 Units  0-9 Units Subcutaneous TID WC Gladstone Lighter, MD   5 Units at 03/09/15 0835  . isosorbide mononitrate (IMDUR) 24 hr tablet 30 mg  30 mg Oral Daily Gladstone Lighter, MD   30 mg at 03/09/15 0835  . levothyroxine (SYNTHROID, LEVOTHROID) tablet 112 mcg  112 mcg Oral QAC breakfast Gladstone Lighter, MD   112 mcg at 03/09/15 0837  . LORazepam (ATIVAN) injection 0.5 mg  0.5 mg Intravenous Q4H PRN Gladstone Lighter, MD   2 mg at 03/06/15 0428  . multivitamin with minerals tablet 1 tablet  1 tablet Oral Daily Gladstone Lighter, MD   1 tablet at 03/09/15 0836  . olopatadine (PATANOL) 0.1 % ophthalmic solution 1 drop  1 drop Both Eyes Daily Gladstone Lighter, MD   1 drop at 03/09/15 0835  . ondansetron (ZOFRAN) tablet 4 mg  4 mg Oral Q6H PRN Gladstone Lighter, MD       Or  . ondansetron (ZOFRAN) injection 4 mg  4 mg Intravenous Q6H PRN Gladstone Lighter, MD      . oxybutynin (DITROPAN) tablet 5 mg  5 mg Oral BID Theodoro Grist, MD   5 mg at 03/09/15 0835  . pantoprazole (PROTONIX) EC tablet 40 mg  40 mg Oral QAC breakfast Gladstone Lighter, MD   40 mg at 03/09/15 0836  . polyethylene glycol (MIRALAX / GLYCOLAX) packet 17 g  17 g Oral Daily PRN Gladstone Lighter, MD      . sodium bicarbonate tablet 650 mg  650 mg Oral BID Gladstone Lighter, MD   650 mg at 03/09/15 0837  . Vitamin D (Ergocalciferol) (DRISDOL) capsule 50,000 Units  50,000 Units Oral Q7 days Gladstone Lighter, MD   50,000 Units at 03/05/15 2000     Discharge Medications: Please see  discharge summary for a list of discharge medications.  Relevant Imaging Results:  Relevant Lab Results:   Additional Information  (SSN: [id number])  Loralyn Freshwater, LCSW

## 2015-03-09 NOTE — Progress Notes (Signed)
Pharmacy Antibiotic Follow-up Note  Paige James is a 82 y.o. year-old female admitted on 03/05/2015.    Assessment/Plan: Urine culture results 1/9 indicated ESBL UTI. Spoke with Dr. Clayton Bibles, treated with ertapenem '500mg'$  IV q24hrs due to renal function. Patient received one dose.   Follow up with Lab indicates ESBL report was in error. Patient's UCx is E.coli with sensitivity to unasyn, zosyn, ampicillin.   Per Dr. Clayton Bibles, changed patient to cephalexin '250mg'$  IV q12hr based on patient's renal function. Pharmacy will continue to monitor for changes in renal function that would require dose change.  Temp (24hrs), Avg:97.2 F (36.2 C), Min:95 F (35 C), Max:98.7 F (37.1 C)   Recent Labs Lab 03/05/15 1137 03/06/15 0401 03/07/15 0509 03/08/15 0407  WBC 8.6 7.9 6.7 6.8     Recent Labs Lab 03/05/15 1137 03/06/15 0401 03/07/15 0509 03/08/15 0407 03/09/15 0458  CREATININE 3.41* 3.15* 3.00* 2.68* 2.41*   Estimated Creatinine Clearance: 15 mL/min (by C-G formula based on Cr of 2.41).    No Known Allergies  Antimicrobials this admission: Ceftriaxone 1g  1/6 >> 1/9 Ertapenem '500mg'$   1/9 >> 1/10 Cephalexin 1/10 >>  Microbiology results: 1/6 BCx: 2 out of 2 NGTD 1/6 UCx: E. Coli only resistant to cipro  Thank you for allowing pharmacy to be a part of this patient's care.  Roe Coombs, PharmD Pharmacy Resident 03/09/2015

## 2015-03-10 DIAGNOSIS — F0391 Unspecified dementia with behavioral disturbance: Secondary | ICD-10-CM

## 2015-03-10 DIAGNOSIS — D638 Anemia in other chronic diseases classified elsewhere: Secondary | ICD-10-CM

## 2015-03-10 DIAGNOSIS — N10 Acute tubulo-interstitial nephritis: Secondary | ICD-10-CM | POA: Diagnosis not present

## 2015-03-10 DIAGNOSIS — M6281 Muscle weakness (generalized): Secondary | ICD-10-CM | POA: Diagnosis not present

## 2015-03-10 DIAGNOSIS — I151 Hypertension secondary to other renal disorders: Secondary | ICD-10-CM | POA: Diagnosis not present

## 2015-03-10 DIAGNOSIS — I4891 Unspecified atrial fibrillation: Secondary | ICD-10-CM | POA: Diagnosis not present

## 2015-03-10 DIAGNOSIS — R4182 Altered mental status, unspecified: Secondary | ICD-10-CM | POA: Diagnosis not present

## 2015-03-10 DIAGNOSIS — G9341 Metabolic encephalopathy: Secondary | ICD-10-CM | POA: Diagnosis not present

## 2015-03-10 DIAGNOSIS — E870 Hyperosmolality and hypernatremia: Secondary | ICD-10-CM | POA: Diagnosis not present

## 2015-03-10 DIAGNOSIS — R339 Retention of urine, unspecified: Secondary | ICD-10-CM

## 2015-03-10 DIAGNOSIS — I5022 Chronic systolic (congestive) heart failure: Secondary | ICD-10-CM

## 2015-03-10 DIAGNOSIS — M25561 Pain in right knee: Secondary | ICD-10-CM | POA: Diagnosis not present

## 2015-03-10 DIAGNOSIS — R1310 Dysphagia, unspecified: Secondary | ICD-10-CM

## 2015-03-10 DIAGNOSIS — D508 Other iron deficiency anemias: Secondary | ICD-10-CM | POA: Diagnosis not present

## 2015-03-10 DIAGNOSIS — F03918 Unspecified dementia, unspecified severity, with other behavioral disturbance: Secondary | ICD-10-CM

## 2015-03-10 DIAGNOSIS — Z5189 Encounter for other specified aftercare: Secondary | ICD-10-CM | POA: Diagnosis not present

## 2015-03-10 DIAGNOSIS — I890 Lymphedema, not elsewhere classified: Secondary | ICD-10-CM | POA: Diagnosis not present

## 2015-03-10 DIAGNOSIS — E860 Dehydration: Secondary | ICD-10-CM

## 2015-03-10 DIAGNOSIS — K210 Gastro-esophageal reflux disease with esophagitis: Secondary | ICD-10-CM | POA: Diagnosis not present

## 2015-03-10 DIAGNOSIS — N179 Acute kidney failure, unspecified: Secondary | ICD-10-CM | POA: Diagnosis not present

## 2015-03-10 DIAGNOSIS — I509 Heart failure, unspecified: Secondary | ICD-10-CM | POA: Diagnosis not present

## 2015-03-10 DIAGNOSIS — A498 Other bacterial infections of unspecified site: Secondary | ICD-10-CM

## 2015-03-10 DIAGNOSIS — R1312 Dysphagia, oropharyngeal phase: Secondary | ICD-10-CM | POA: Diagnosis not present

## 2015-03-10 DIAGNOSIS — J441 Chronic obstructive pulmonary disease with (acute) exacerbation: Secondary | ICD-10-CM | POA: Diagnosis not present

## 2015-03-10 DIAGNOSIS — M1712 Unilateral primary osteoarthritis, left knee: Secondary | ICD-10-CM | POA: Diagnosis not present

## 2015-03-10 DIAGNOSIS — E1122 Type 2 diabetes mellitus with diabetic chronic kidney disease: Secondary | ICD-10-CM | POA: Diagnosis not present

## 2015-03-10 DIAGNOSIS — E039 Hypothyroidism, unspecified: Secondary | ICD-10-CM | POA: Diagnosis not present

## 2015-03-10 LAB — BASIC METABOLIC PANEL
ANION GAP: 8 (ref 5–15)
BUN: 62 mg/dL — ABNORMAL HIGH (ref 6–20)
CALCIUM: 7.6 mg/dL — AB (ref 8.9–10.3)
CO2: 29 mmol/L (ref 22–32)
Chloride: 99 mmol/L — ABNORMAL LOW (ref 101–111)
Creatinine, Ser: 2.31 mg/dL — ABNORMAL HIGH (ref 0.44–1.00)
GFR, EST AFRICAN AMERICAN: 20 mL/min — AB (ref >60)
GFR, EST NON AFRICAN AMERICAN: 17 mL/min — AB (ref >60)
GLUCOSE: 195 mg/dL — AB (ref 65–99)
POTASSIUM: 3.6 mmol/L (ref 3.5–5.1)
SODIUM: 136 mmol/L (ref 135–145)

## 2015-03-10 LAB — GLUCOSE, CAPILLARY
GLUCOSE-CAPILLARY: 184 mg/dL — AB (ref 65–99)
GLUCOSE-CAPILLARY: 153 mg/dL — AB (ref 65–99)

## 2015-03-10 MED ORDER — CEPHALEXIN 250 MG/5ML PO SUSR: 250.0000 mg | Freq: Two times a day (BID) | ORAL | Status: DC

## 2015-03-10 MED ORDER — DOCUSATE SODIUM 100 MG PO CAPS: 100.0000 mg | ORAL_CAPSULE | Freq: Two times a day (BID) | ORAL | Status: AC

## 2015-03-10 MED ORDER — DILTIAZEM HCL 30 MG PO TABS: 30.0000 mg | ORAL_TABLET | Freq: Four times a day (QID) | ORAL | Status: AC

## 2015-03-10 MED ORDER — POLYETHYLENE GLYCOL 3350 17 G PO PACK: 17.0000 g | PACK | Freq: Every day | ORAL | Status: AC | PRN

## 2015-03-10 NOTE — Discharge Planning (Signed)
Report called to [facility] nurse Annette Stable

## 2015-03-10 NOTE — Clinical Social Work Note (Signed)
Pt is ready for discharge today to [facility]. Pt's guardian was given choice and [facility] was chosen. CSW sent discharge information and is ready to admit pt. Guardian will be completing admission paperwork. RN will call report and EMS will provide transportation. CSW is signing off as no further needs identified.   Darden Dates, MSW, LCSW Clinical Social Worker  [phone]

## 2015-03-10 NOTE — Progress Notes (Signed)
Nurse tech reported no urinary output for hours, bladder scanned revealed 430cc, Dr. Rexanne Mano on unit at the time, reported to her, order In and out cath. Cath 400cc.

## 2015-03-10 NOTE — Discharge Summary (Signed)
[facility] NAME: Paige James    MR#:  [id number]  DATE OF BIRTH:  1921/01/29  DATE OF ADMISSION:  03/05/2015 ADMITTING PHYSICIAN: Gladstone Lighter, MD  DATE OF DISCHARGE: No discharge date for patient encounter.  PRIMARY CARE PHYSICIAN: Pcp Not In System     ADMISSION DIAGNOSIS:  Weakness [R53.1] UTI (lower urinary tract infection) [N39.0] Foot pain, right [M79.671]  DISCHARGE DIAGNOSIS:  Principal Problem:   Acute pyelonephritis Active Problems:   Acute on chronic renal failure (HCC)   Hypernatremia   Weakness   Escherichia coli (E. coli) infection   Dehydration   Dysphagia   Dementia with behavioral disturbance   Urinary retention   Chronic systolic CHF (congestive heart failure) (HCC)   Anemia of chronic disease   SECONDARY DIAGNOSIS:   Past Medical History  Diagnosis Date  . Renal disorder     CKD stage 4  . Diabetes mellitus without complication ([place])   . Thyroid disease     Hypothyroidism  . Senile dementia   . Hypertension   . Atopic dermatitis   . Osteoarthritis   . Depressive disorder   . Atrial fibrillation ([place])   . Breast cancer [facility])     s/p right mastectomy  . Congestive heart failure (CHF) (HCC)     Ejection fraction of 45-50%    .pro HOSPITAL COURSE:   The patient is 82 year old African-American female with asthma, history significant for history of CKD, stage IV, atrial fibrillation, osteoarthritis, chronic systolic CHF with ejection fraction of 45%, dementia with behavioral disturbance who presents to the hospital with worsening confusion and weakness. She also smelled of urine and was incontinent. Urinalysis was significant for pyuria. Patient's labs revealed acute on chronic renal failure with creatinine of 3.15, and estimated GFR of 14, Hypernatremia with sodium level of 146, anemia, but not leukocytosis Patient was admitted to the hospital for further evaluation. She was  given IV fluids, antibiotics and blood cultures as well as urinary cultures were checked. . , blood cultures showed no growth in 4 days. Urinary cultures revealed Escherichia coli, more than 100,000 colony-forming units, sensitive to ampicillin, sulbactam, ceftriaxone, gentamicin and imipenem, nitrofurantoin, Zosyn, trimethoprim/sulfamethoxazole .  patient was continued on Rocephin while she was in the hospital and changed to Keflex upon discharge . With conservative therapy. Her condition improved. She however had intermittent agitation, which was attributed to infection and underlying dementia. Patient was rehydrated and her kidney function improved . Her creatinine improved to 2.31  and estimated GFR of 20 . It is recommended to push oral fluids in this patient and consult palliative care in skilled nursing facility , since patient herself is not able to ensure hydration , unless she is helped with feedings and hydration. Patient's hemoglobin A1c was checked, was found to be 7.0. Patient was seen by speech therapist and recommended dysphagia 1 with nectar thick liquid diet.  Discussion by problem  1. Acute pyelonephritis due to Escherichia coli , continue Keflex orally for 12 days. Blood cultures are negative.  2. Acute on chronic renal failure , patient was continued on IV fluids while she was in the hospital and her creatinine has improved, kidney ultrasound revealed increased renal cortical echogenicity and 5 mm stone in her left kidney, no hydronephrosis. Patient was seen by nephrologist , appreciate input. It is recommended to push oral fluids and continue hydration orally as much as possible, it is recommended to follow patient's kidney function as  outpatient closely since she  tends to get dehydrated quickly due to history of dysphagia and diabetes.  3. Hypernatremia , resolved on IV fluids , follow as outpatient consult palliative care to follow patient in skilled nursing facility  4. Anemia of  chronic disease, patient was given one dose of Neupogen ,  this is recommended to follow hemoglobin level as outpatient. Patient may benefit from a follow-up with nephrologist and administration of Procrit intermittently.   5. Metabolic encephalopathy due to infection in the setting of dementipatient's mental status stabilized. She is more alert and able to converse and intermittently follow commands , which is likely her baseline  6. 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 Urinary retention, likely infection related, in and out catheterization as needed every 8 hours is recommended while in the facility 8. Atrial fibrillation, continue Coreg, change Cardizem CD to short-acting Cardizem due to patient's blood pressure running low, following blood pressure and heart rate readings closely.   DISCHARGE CONDITIONS:   Stable  CONSULTS OBTAINED:  Treatment Team:  Lavonia Dana, MD  DRUG ALLERGIES:  No Known Allergies  DISCHARGE MEDICATIONS:   Current Discharge Medication List    START taking these medications   Details  cephALEXin (KEFLEX) 250 MG/5ML suspension Take 5 mLs (250 mg total) by mouth every 12 (twelve) hours. Qty: 100 mL, Refills: 0    diltiazem (CARDIZEM) 30 MG tablet Take 1 tablet (30 mg total) by mouth 4 (four) times daily. Qty: 120 tablet, Refills: 6    docusate sodium (COLACE) 100 MG capsule Take 1 capsule (100 mg total) by mouth 2 (two) times daily. Qty: 10 capsule, Refills: 0    polyethylene glycol (MIRALAX / GLYCOLAX) packet Take 17 g by mouth daily as needed for mild constipation. Qty: 14 each, Refills: 0      CONTINUE these medications which have NOT CHANGED   Details  carvedilol (COREG) 3.125 MG tablet Take 3.125 mg by mouth 2 (two) times daily.     clobetasol cream (TEMOVATE) 0.09 % Apply 1 application topically 2 (two) times daily as needed (for  irritation). Pt applies to legs and feet.    FLUoxetine (PROZAC) 10 MG capsule Take 10 mg by mouth daily.    glipiZIDE (GLUCOTROL) 5 MG tablet Take 5 mg by mouth 2 (two) times daily with a meal.    hydrALAZINE (APRESOLINE) 25 MG tablet Take 25 mg by mouth 3 (three) times daily.    isosorbide mononitrate (IMDUR) 30 MG 24 hr tablet Take 1 tablet (30 mg total) by mouth daily. Qty: 30 tablet, Refills: 6    levocetirizine (XYZAL) 5 MG tablet Take 5 mg by mouth daily.    levothyroxine (SYNTHROID, LEVOTHROID) 112 MCG tablet Take 112 mcg by mouth daily.    LORazepam (ATIVAN) 0.5 MG tablet Take 0.5 mg by mouth 2 (two) times daily as needed (for agitation).    Multiple Vitamin (MULTIVITAMIN WITH MINERALS) TABS tablet Take 1 tablet by mouth daily.    Olopatadine HCl (PAZEO) 0.7 % SOLN Place 1 drop into both eyes daily.    oxybutynin (DITROPAN-XL) 10 MG 24 hr tablet Take 10 mg by mouth daily.    pantoprazole (PROTONIX) 40 MG tablet Take 40 mg by mouth daily.    sodium bicarbonate 650 MG tablet Take 650 mg by mouth 2 (two) times daily.    Vitamin D, Ergocalciferol, (DRISDOL) 50000 UNITS CAPS capsule Take 50,000  Units by mouth every 7 (seven) days. Pt takes on Wednesday.    albuterol (PROVENTIL) (2.5 MG/3ML) 0.083% nebulizer solution Take 3 mLs (2.5 mg total) by nebulization every 6 (six) hours as needed for wheezing or shortness of breath. Qty: 75 mL, Refills: 12    oxyCODONE (OXY IR/ROXICODONE) 5 MG immediate release tablet Take 1 tablet (5 mg total) by mouth every 6 (six) hours as needed for moderate pain. Qty: 20 tablet, Refills: 0      STOP taking these medications     diltiazem (CARDIZEM CD) 120 MG 24 hr capsule      cefUROXime (CEFTIN) 250 MG tablet          DISCHARGE INSTRUCTIONS:    Patient is to follow-up with primary care physician, nephrologist as outpatient,  If you experience worsening of your admission symptoms, develop shortness of breath, life threatening  emergency, suicidal or homicidal thoughts you must seek medical attention immediately by calling 911 or calling your MD immediately  if symptoms less severe.  You Must read complete instructions/literature along with all the possible adverse reactions/side effects for all the Medicines you take and that have been prescribed to you. Take any new Medicines after you have completely understood and accept all the possible adverse reactions/side effects.   Please note  You were cared for by a hospitalist during your hospital stay. If you have any questions about your discharge medications or the care you received while you were in the hospital after you are discharged, you can call the unit and asked to speak with the hospitalist on call if the hospitalist that took care of you is not available. Once you are discharged, your primary care physician will handle any further medical issues. Please note that NO REFILLS for any discharge medications will be authorized once you are discharged, as it is imperative that you return to your primary care physician (or establish a relationship with a primary care physician if you do not have one) for your aftercare needs so that they can reassess your need for medications and monitor your lab values.    Today   CHIEF COMPLAINT:   Chief Complaint  Patient presents with  . Back Pain  . Difficulty Walking    HISTORY OF PRESENT ILLNESS:  Sitlali Koerner  is a 82 y.o. female with a known history of CKD, stage IV, atrial fibrillation, osteoarthritis, chronic systolic CHF with ejection fraction of 45%, dementia with behavioral disturbance who presents to the hospital with worsening confusion and weakness. She also smelled of urine and was incontinent. Urinalysis was significant for pyuria. Patient's labs revealed acute on chronic renal failure with creatinine of 3.15, and estimated GFR of 14, Hypernatremia with sodium level of 146, anemia, but not leukocytosis Patient was  admitted to the hospital for further evaluation. She was given IV fluids, antibiotics and blood cultures as well as urinary cultures were checked. . , blood cultures showed no growth in 4 days. Urinary cultures revealed Escherichia coli, more than 100,000 colony-forming units, sensitive to ampicillin, sulbactam, ceftriaxone, gentamicin and imipenem, nitrofurantoin, Zosyn, trimethoprim/sulfamethoxazole .  patient was continued on Rocephin while she was in the hospital and changed to Keflex upon discharge . With conservative therapy. Her condition improved. She however had intermittent agitation, which was attributed to infection and underlying dementia. Patient was rehydrated and her kidney function improved . Her creatinine improved to 2.31  and estimated GFR of 20 . It is recommended to push oral fluids in this patient and  consult palliative care in skilled nursing facility , since patient herself is not able to ensure hydration , unless she is helped with feedings and hydration. Patient's hemoglobin A1c was checked, was found to be 7.0. Patient was seen by speech therapist and recommended dysphagia 1 with nectar thick liquid diet.  Discussion by problem  1. Acute pyelonephritis due to Escherichia coli , continue Keflex orally for 12 days. Blood cultures are negative.  2. Acute on chronic renal failure , patient was continued on IV fluids while she was in the hospital and her creatinine has improved, kidney ultrasound revealed increased renal cortical echogenicity and 5 mm stone in her left kidney, no hydronephrosis. Patient was seen by nephrologist , appreciate input. It is recommended to push oral fluids and continue hydration orally as much as possible, it is recommended to follow patient's kidney function as outpatient closely since she  tends to get dehydrated quickly due to history of dysphagia and diabetes.  3. Hypernatremia , resolved on IV fluids , follow as outpatient consult palliative care to  follow patient in skilled nursing facility  4. Anemia of chronic disease, patient was given one dose of Neupogen ,  this is recommended to follow hemoglobin level as outpatient. Patient may benefit from a follow-up with nephrologist and administration of Procrit intermittently.   5. Metabolic encephalopathy due to infection in the setting of dementipatient's mental status stabilized. She is more alert and able to converse and intermittently follow commands , which is likely her baseline  6. 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 Urinary retention, likely infection related, in and out catheterization as needed every 8 hours is recommended while in the facility 8. Atrial fibrillation, continue Coreg, change Cardizem CD to short-acting Cardizem due to patient's blood pressure running low, following blood pressure and heart rate readings closely.     VITAL SIGNS:  Blood pressure 126/47, pulse 84, temperature 98.1 F (36.7 C), temperature source Oral, resp. rate 19, height '5\' 4"'$  (1.626 m), weight 78.019 kg (172 lb), SpO2 100 %.  I/O:    Intake/Output Summary (Last 24 hours) at 03/10/15 1255 Last data filed at 03/10/15 1111  Gross per 24 hour  Intake 1258.33 ml  Output    830 ml  Net 428.33 ml    PHYSICAL EXAMINATION:  GENERAL:  82 y.o.-year-old patient lying in the bed with no acute distress. Mumbles by herself.  intermittently follows commands, answers some questions EYES: Pupils equal, round, reactive to light and accommodation. No scleral icterus. Extraocular muscles intact.  HEENT: Head atraumatic, normocephalic. Oropharynx and nasopharynx clear.  NECK:  Supple, no jugular venous distention. No thyroid enlargement, no tenderness.  LUNGS: Normal breath sounds bilaterally, no wheezing, rales,rhonchi or crepitation. No use of accessory muscles of respiration.  CARDIOVASCULAR: S1, S2  normal. No murmurs, rubs, or gallops.  ABDOMEN: Soft, non-tender, non-distended. Bowel sounds present. No organomegaly or mass.  EXTREMITIES: No pedal edema, cyanosis, or clubbing.  NEUROLOGIC: Cranial nerves II through XII difficult to evaluate . Muscle strength difficulty at evaluate in all extremities. Due to dementia as well as agitation. Sensation not able to evaluate. Gait not checked. Right knee ankylosis PSYCHIATRIC: The patient is alert and not able to assess orientation .  SKIN: No obvious rash, lesion, or ulcer.   DATA REVIEW:   CBC  Recent Labs Lab 03/08/15 0407 03/09/15 0458  WBC 6.8  --   HGB  7.3* 7.0*  HCT 22.3*  --   PLT 215  --     Chemistries   Recent Labs Lab 03/05/15 1137  03/10/15 0319  NA 144  < > 136  K 3.2*  < > 3.6  CL 100*  < > 99*  CO2 30  < > 29  GLUCOSE 162*  < > 195*  BUN 64*  < > 62*  CREATININE 3.41*  < > 2.31*  CALCIUM 8.1*  < > 7.6*  AST 44*  --   --   ALT 22  --   --   ALKPHOS 96  --   --   BILITOT 0.9  --   --   < > = values in this interval not displayed.  Cardiac Enzymes No results for input(s): TROPONINI in the last 168 hours.  Microbiology Results  Results for orders placed or performed during the hospital encounter of 03/05/15  Urine culture     Status: None   Collection Time: 03/05/15 11:38 AM  Result Value Ref Range Status   Specimen Description URINE, RANDOM  Final   Special Requests NONE  Final   Culture   Final    >=100,000 COLONIES/mL ESCHERICHIA COLI CRITICAL RESULT CALLED TO, READ BACK BY AND VERIFIED WITH: Rhae Hammock @ [id number] 03/08/15 by [facility]    Report Status 03/08/2015 FINAL  Final   Organism ID, Bacteria ESCHERICHIA COLI  Final      Susceptibility   Escherichia coli - MIC*    Extended ESBL Value in next row Sensitive      NEGATIVECORRECTED ON 03/09/2015 at 1055: PREVIOUSLY REPORTED AS POS    AMPICILLIN/SULBACTAM Value in next row Sensitive      SENSITIVE<=2    PIP/TAZO Value in next row Sensitive       SENSITIVE<=4    CEFTRIAXONE Value in next row Sensitive      SENSITIVE<=1    IMIPENEM Value in next row Sensitive      SENSITIVE0.5    GENTAMICIN Value in next row Sensitive      SENSITIVE<=1    CIPROFLOXACIN Value in next row Resistant      RESISTANT>=4    NITROFURANTOIN Value in next row Sensitive      SENSITIVE<=16    TRIMETH/SULFA Value in next row Sensitive      SENSITIVE<=20    * >=100,000 COLONIES/mL ESCHERICHIA COLI  CULTURE, BLOOD (ROUTINE X 2) w Reflex to PCR ID Panel     Status: None (Preliminary result)   Collection Time: 03/05/15  6:44 PM  Result Value Ref Range Status   Specimen Description BLOOD LEFT ASSIST CONTROL  Final   Special Requests BOTTLES DRAWN AEROBIC AND ANAEROBIC 4CC  Final   Culture NO GROWTH 4 DAYS  Final   Report Status PENDING  Incomplete  CULTURE, BLOOD (ROUTINE X 2) w Reflex to PCR ID Panel     Status: None (Preliminary result)   Collection Time: 03/05/15  7:54 PM  Result Value Ref Range Status   Specimen Description BLOOD LEFT ASSIST CONTROL  Final   Special Requests BOTTLES DRAWN AEROBIC AND ANAEROBIC 4CC  Final   Culture NO GROWTH 4 DAYS  Final   Report Status PENDING  Incomplete    RADIOLOGY:  No results found.  EKG:   Orders placed or performed during the hospital encounter of 12/08/14  . EKG 12-Lead  . EKG 12-Lead  . ED EKG  . ED EKG  . EKG      Management  plans discussed with the patient, family and they are in agreement.  CODE STATUS:     Code Status Orders        Start     Ordered   03/05/15 1700  Full code   Continuous     03/05/15 1659    Code Status History    Date Active Date Inactive Code Status Order ID Comments User Context   12/21/2014  5:16 PM 12/22/2014  4:06 PM DNR [id number]  Gladstone Lighter, MD ED   12/08/2014  8:31 PM 12/18/2014  8:53 PM DNR [id number]  Max Sane, MD Inpatient   12/08/2014  4:40 PM 12/08/2014  8:31 PM DNR [id number]  Max Sane, MD ED      TOTAL TIME TAKING CARE OF THIS PATIENT:  40 minutes.    Theodoro Grist M.D on 03/10/2015 at 12:55 PM  Between 7am to 6pm - Pager - [phone]  After 6pm go to [email or web address] - password [email or web address] [facility]  Office  [phone]  CC: Primary care physician; Pcp Not In System

## 2015-03-10 NOTE — Progress Notes (Signed)
Spoke with Jenny Reichmann, [facility] rep at [phone], to notify of non-emergent EMS transport.  Auth notification reference given as [id number].   Service date range good from 03/10/15 - 06/08/15.   Gap exception requested to determine if services can be considered at an in-network level.

## 2015-03-10 NOTE — Care Management Important Message (Signed)
Important Message  Patient Details  Name: Paige James MRN: [id number] Date of Birth: 11-03-1920   Medicare Important Message Given:  Yes    Marshell Garfinkel, RN 03/10/2015, 9:47 AM

## 2015-03-10 NOTE — Progress Notes (Signed)
Speech Language Pathology Treatment: Dysphagia  Patient Details Name: DENETRIA LUEVANOS MRN: [id number] DOB: 06/30/1920 Today's Date: 03/10/2015 Time: 1030-1100 SLP Time Calculation (min) (ACUTE ONLY): 30 min  Assessment / Plan / Recommendation Clinical Impression  Pt seen for po toleration as well as trials of thin liquids to upgrade to liquids again. Pt appeared more fatigued and less engaged than at eval. Pt exhibited increased distraction and required mod-max cues for oral stim to accept po trials. Pt then exhibited increased oral phase time w/ trials exhibiting munching/chewing motions w/ intermittent oral holding noted. Pt was given verbal and tactile cues to encourage swallowing and given time she finally cleared. This increased oral phase dysphagia greatly increases risk for pharyngeal phase deficits and aspiration. Pt only accepted few tsp trials, and 1 cup trial w/ liquids, put to her mouth. Pt accepted 2-3 Nectar consistency liquid trials but again exhibited oral phase dysphagia.  Pt appears to present w/ increased oral phase dysphagia thus increasing risk for pharyngeal phase dysphagia and aspiration. Pt's Cognitive status appears more declined and pt requires more cues when taking po's; more monitoring. Rec. Continue w/ current diet and aspiration precautions; meds in puree. Rec. F/u post d/c as indicated for any trials to upgrade diet. NSG updated.     HPI HPI: Pt is a 82 y.o. female with a known history of dementia, systolic CHF with EF of 78%, CK D stage IV, atrial fibrillation not on anticoagulation, osteoarthritis who is from [facility] was brought in secondary to worsening confusion. She usually ambulates with the help of a walker, but was very weak and confused and was unable to get out of bed today. EMS was called, she was complaining of pain in her feet. She also smells of urine and was incontinent. No fevers reported. No elevation in white count. urine analysis significant  for UTI. NSG reported pt has been hitting but w/ redirection, pt participated briefly in tx session and accepted few po trials w/ SLP. Pt intermittently verbal and responded to a few basic questions about herself but mostly nonverbal. She did not readily follow commands. Pt appeared more disheveled and fatigued. Pt's liquid consistency was changed to a Nectar consistency sec. to difficulty managing the liquids one morning w/ NSG. Suspect as pt's Cognitive status has declined so has her safe toleration of liquids/po's.       SLP Plan  Continue with current plan of care     Recommendations  Diet recommendations: Dysphagia 1 (puree);Nectar-thick liquid Liquids provided via: Cup;Straw;Teaspoon Medication Administration: Crushed with puree Supervision: Full supervision/cueing for compensatory strategies;Staff to assist with self feeding;Trained caregiver to feed patient Compensations: Minimize environmental distractions;Slow rate;Small sips/bites;Follow solids with liquid (check for oral clearing b/t bites) Postural Changes and/or Swallow Maneuvers: Seated upright 90 degrees              Oral Care Recommendations: Oral care BID;Staff/trained caregiver to provide oral care Follow up Recommendations: Skilled Nursing facility (TBD) Plan: Continue with current plan of care    Orinda Kenner, [place], CCC-SLP  Corbet Hanley 03/10/2015, 4:40 PM

## 2015-03-10 NOTE — Clinical Social Work Placement (Signed)
   CLINICAL SOCIAL WORK PLACEMENT  NOTE  Date:  03/10/2015  Patient Details  Name: Paige James MRN: [id number] Date of Birth: March 16, 1920  Clinical Social Work is seeking post-discharge placement for this patient at the [place] level of care (*CSW will initial, date and re-position this form in  chart as items are completed):  Yes   Patient/family provided with [facility] Work Department's list of facilities offering this level of care within the geographic area requested by the patient (or if unable, by the patient's family).  Yes   Patient/family informed of their freedom to choose among providers that offer the needed level of care, that participate in Medicare, Medicaid or managed care program needed by the patient, have an available bed and are willing to accept the patient.  Yes   Patient/family informed of [facility]'s ownership interest in [facility] and [facility], as well as of the fact that they are under no obligation to receive care at these facilities.  PASRR submitted to EDS on       PASRR number received on       Existing PASRR number confirmed on 03/09/15     FL2 transmitted to all facilities in geographic area requested by pt/family on 03/09/15     FL2 transmitted to all facilities within larger geographic area on       Patient informed that his/her managed care company has contracts with or will negotiate with certain facilities, including the following:        Yes   Patient/family informed of bed offers received.  Patient chooses bed at [facility]     Physician recommends and patient chooses bed at  [facility])    Patient to be transferred to [facility] on 03/10/15.  Patient to be transferred to facility by [facility]     Patient family notified on 03/10/15 of transfer.  Name of family member notified:  Kirke Shaggy, DSS Guardian.      PHYSICIAN       Additional Comment:     _______________________________________________ Darden Dates, LCSW 03/10/2015, 3:19 PM

## 2015-03-11 LAB — CULTURE, BLOOD (ROUTINE X 2)
CULTURE: NO GROWTH
CULTURE: NO GROWTH

## 2015-03-19 DIAGNOSIS — E1122 Type 2 diabetes mellitus with diabetic chronic kidney disease: Secondary | ICD-10-CM | POA: Diagnosis not present

## 2015-03-19 DIAGNOSIS — J441 Chronic obstructive pulmonary disease with (acute) exacerbation: Secondary | ICD-10-CM | POA: Diagnosis not present

## 2015-03-19 DIAGNOSIS — I890 Lymphedema, not elsewhere classified: Secondary | ICD-10-CM | POA: Diagnosis not present

## 2015-03-19 DIAGNOSIS — M1712 Unilateral primary osteoarthritis, left knee: Secondary | ICD-10-CM | POA: Diagnosis not present

## 2015-03-23 DIAGNOSIS — I4891 Unspecified atrial fibrillation: Secondary | ICD-10-CM | POA: Diagnosis not present

## 2015-03-23 DIAGNOSIS — I509 Heart failure, unspecified: Secondary | ICD-10-CM | POA: Diagnosis not present

## 2015-03-23 DIAGNOSIS — K210 Gastro-esophageal reflux disease with esophagitis: Secondary | ICD-10-CM | POA: Diagnosis not present

## 2015-04-09 ENCOUNTER — Inpatient Hospital Stay
Admission: EM | Admit: 2015-04-09 | Discharge: 2015-04-28 | DRG: 193 | Disposition: E | Payer: Medicare Other | Attending: Internal Medicine | Admitting: Internal Medicine

## 2015-04-09 ENCOUNTER — Emergency Department: Payer: Medicare Other

## 2015-04-09 ENCOUNTER — Encounter: Payer: Self-pay | Admitting: Emergency Medicine

## 2015-04-09 ENCOUNTER — Inpatient Hospital Stay: Payer: Medicare Other

## 2015-04-09 DIAGNOSIS — I469 Cardiac arrest, cause unspecified: Secondary | ICD-10-CM | POA: Diagnosis present

## 2015-04-09 DIAGNOSIS — D696 Thrombocytopenia, unspecified: Secondary | ICD-10-CM | POA: Diagnosis present

## 2015-04-09 DIAGNOSIS — J181 Lobar pneumonia, unspecified organism: Secondary | ICD-10-CM

## 2015-04-09 DIAGNOSIS — J189 Pneumonia, unspecified organism: Secondary | ICD-10-CM | POA: Diagnosis present

## 2015-04-09 DIAGNOSIS — Z9011 Acquired absence of right breast and nipple: Secondary | ICD-10-CM

## 2015-04-09 DIAGNOSIS — Z853 Personal history of malignant neoplasm of breast: Secondary | ICD-10-CM

## 2015-04-09 DIAGNOSIS — N179 Acute kidney failure, unspecified: Secondary | ICD-10-CM | POA: Diagnosis present

## 2015-04-09 DIAGNOSIS — Z9889 Other specified postprocedural states: Secondary | ICD-10-CM

## 2015-04-09 DIAGNOSIS — R001 Bradycardia, unspecified: Secondary | ICD-10-CM | POA: Diagnosis present

## 2015-04-09 DIAGNOSIS — J9621 Acute and chronic respiratory failure with hypoxia: Secondary | ICD-10-CM

## 2015-04-09 DIAGNOSIS — Z66 Do not resuscitate: Secondary | ICD-10-CM | POA: Diagnosis present

## 2015-04-09 DIAGNOSIS — Z8042 Family history of malignant neoplasm of prostate: Secondary | ICD-10-CM | POA: Diagnosis not present

## 2015-04-09 DIAGNOSIS — I5022 Chronic systolic (congestive) heart failure: Secondary | ICD-10-CM | POA: Diagnosis present

## 2015-04-09 DIAGNOSIS — Z79899 Other long term (current) drug therapy: Secondary | ICD-10-CM

## 2015-04-09 DIAGNOSIS — E1122 Type 2 diabetes mellitus with diabetic chronic kidney disease: Secondary | ICD-10-CM | POA: Diagnosis present

## 2015-04-09 DIAGNOSIS — I130 Hypertensive heart and chronic kidney disease with heart failure and stage 1 through stage 4 chronic kidney disease, or unspecified chronic kidney disease: Secondary | ICD-10-CM | POA: Diagnosis present

## 2015-04-09 DIAGNOSIS — E039 Hypothyroidism, unspecified: Secondary | ICD-10-CM | POA: Diagnosis present

## 2015-04-09 DIAGNOSIS — Z800 Family history of malignant neoplasm of digestive organs: Secondary | ICD-10-CM

## 2015-04-09 DIAGNOSIS — Z806 Family history of leukemia: Secondary | ICD-10-CM

## 2015-04-09 DIAGNOSIS — Z515 Encounter for palliative care: Secondary | ICD-10-CM | POA: Diagnosis present

## 2015-04-09 DIAGNOSIS — I482 Chronic atrial fibrillation: Secondary | ICD-10-CM | POA: Diagnosis present

## 2015-04-09 DIAGNOSIS — J90 Pleural effusion, not elsewhere classified: Secondary | ICD-10-CM

## 2015-04-09 DIAGNOSIS — F0390 Unspecified dementia without behavioral disturbance: Secondary | ICD-10-CM | POA: Diagnosis present

## 2015-04-09 DIAGNOSIS — I959 Hypotension, unspecified: Secondary | ICD-10-CM | POA: Diagnosis present

## 2015-04-09 DIAGNOSIS — R4189 Other symptoms and signs involving cognitive functions and awareness: Secondary | ICD-10-CM

## 2015-04-09 DIAGNOSIS — N184 Chronic kidney disease, stage 4 (severe): Secondary | ICD-10-CM | POA: Diagnosis present

## 2015-04-09 DIAGNOSIS — R4182 Altered mental status, unspecified: Secondary | ICD-10-CM

## 2015-04-09 DIAGNOSIS — D649 Anemia, unspecified: Secondary | ICD-10-CM | POA: Diagnosis present

## 2015-04-09 LAB — CBC WITH DIFFERENTIAL/PLATELET
BASOS PCT: 0 %
Band Neutrophils: 2 %
Basophils Absolute: 0 10*3/uL (ref 0–0.1)
Blasts: 0 %
EOS PCT: 2 %
Eosinophils Absolute: 0.1 10*3/uL (ref 0–0.7)
HEMATOCRIT: 22.3 % — AB (ref 35.0–47.0)
Hemoglobin: 7.2 g/dL — ABNORMAL LOW (ref 12.0–16.0)
LYMPHS ABS: 0.6 10*3/uL — AB (ref 1.0–3.6)
LYMPHS PCT: 12 %
MCH: 30.7 pg (ref 26.0–34.0)
MCHC: 32.2 g/dL (ref 32.0–36.0)
MCV: 95.5 fL (ref 80.0–100.0)
MONO ABS: 0.3 10*3/uL (ref 0.2–0.9)
Metamyelocytes Relative: 0 %
Monocytes Relative: 5 %
Myelocytes: 0 %
NEUTROS PCT: 79 %
NRBC: 3 /100{WBCs} — AB
Neutro Abs: 4.2 10*3/uL (ref 1.4–6.5)
OTHER: 0 %
PROMYELOCYTES ABS: 0 %
Platelets: 110 10*3/uL — ABNORMAL LOW (ref 150–440)
RBC: 2.33 MIL/uL — ABNORMAL LOW (ref 3.80–5.20)
RDW: 21.7 % — AB (ref 11.5–14.5)
WBC: 5.2 10*3/uL (ref 3.6–11.0)

## 2015-04-09 LAB — COMPREHENSIVE METABOLIC PANEL
ALBUMIN: 2.2 g/dL — AB (ref 3.5–5.0)
ALT: 27 U/L (ref 14–54)
ANION GAP: 9 (ref 5–15)
AST: 30 U/L (ref 15–41)
Alkaline Phosphatase: 91 U/L (ref 38–126)
BILIRUBIN TOTAL: 0.7 mg/dL (ref 0.3–1.2)
BUN: 49 mg/dL — ABNORMAL HIGH (ref 6–20)
CO2: 27 mmol/L (ref 22–32)
Calcium: 8 mg/dL — ABNORMAL LOW (ref 8.9–10.3)
Chloride: 109 mmol/L (ref 101–111)
Creatinine, Ser: 4.05 mg/dL — ABNORMAL HIGH (ref 0.44–1.00)
GFR calc non Af Amer: 9 mL/min — ABNORMAL LOW (ref >60)
GFR, EST AFRICAN AMERICAN: 10 mL/min — AB (ref >60)
GLUCOSE: 93 mg/dL (ref 65–99)
POTASSIUM: 3.7 mmol/L (ref 3.5–5.1)
Sodium: 145 mmol/L (ref 135–145)
TOTAL PROTEIN: 6 g/dL — AB (ref 6.5–8.1)

## 2015-04-09 LAB — BODY FLUID CELL COUNT WITH DIFFERENTIAL
EOS FL: 0 %
LYMPHS FL: 62 %
MONOCYTE-MACROPHAGE-SEROUS FLUID: 24 %
Neutrophil Count, Fluid: 14 %
OTHER CELLS FL: 0 %
Total Nucleated Cell Count, Fluid: 36 cu mm

## 2015-04-09 LAB — URINALYSIS COMPLETE WITH MICROSCOPIC (ARMC ONLY)
BACTERIA UA: NONE SEEN
BILIRUBIN URINE: NEGATIVE
GLUCOSE, UA: NEGATIVE mg/dL
Hgb urine dipstick: NEGATIVE
KETONES UR: NEGATIVE mg/dL
NITRITE: NEGATIVE
Protein, ur: 100 mg/dL — AB
RBC / HPF: NONE SEEN RBC/hpf (ref 0–5)
Specific Gravity, Urine: 1.019 (ref 1.005–1.030)
pH: 5 (ref 5.0–8.0)

## 2015-04-09 LAB — PROTEIN, BODY FLUID

## 2015-04-09 LAB — GLUCOSE, SEROUS FLUID: GLUCOSE FL: 97 mg/dL

## 2015-04-09 LAB — MRSA PCR SCREENING: MRSA BY PCR: POSITIVE — AB

## 2015-04-09 LAB — TROPONIN I: TROPONIN I: 0.03 ng/mL (ref <0.031)

## 2015-04-09 MED ORDER — VANCOMYCIN HCL IN DEXTROSE 750-5 MG/150ML-% IV SOLN
750.0000 mg | INTRAVENOUS | Status: DC
  Filled 2015-04-09: qty 150

## 2015-04-09 MED ORDER — PIPERACILLIN-TAZOBACTAM 3.375 G IVPB
3.3750 g | Freq: Two times a day (BID) | INTRAVENOUS | Status: DC
  Filled 2015-04-09: qty 50

## 2015-04-09 MED ORDER — ONDANSETRON HCL 4 MG/2ML IJ SOLN: 4.0000 mg | Freq: Four times a day (QID) | INTRAMUSCULAR | Status: DC | PRN

## 2015-04-09 MED ORDER — SODIUM CHLORIDE 0.9 % IV BOLUS (SEPSIS)
1000.0000 mL | Freq: Once | INTRAVENOUS | Status: AC
  Administered 2015-04-09: 1000 mL via INTRAVENOUS

## 2015-04-09 MED ORDER — SODIUM CHLORIDE 0.9 % IV SOLN
INTRAVENOUS | Status: DC
  Administered 2015-04-09: 1000 mL via INTRAVENOUS

## 2015-04-09 MED ORDER — LEVOFLOXACIN IN D5W 500 MG/100ML IV SOLN: 500.0000 mg | INTRAVENOUS | Status: DC

## 2015-04-09 MED ORDER — INSULIN ASPART 100 UNIT/ML ~~LOC~~ SOLN: 0.0000 [IU] | Freq: Three times a day (TID) | SUBCUTANEOUS | Status: DC

## 2015-04-09 MED ORDER — ATROPINE SULFATE 0.1 MG/ML IJ SOLN
INTRAMUSCULAR | Status: AC
  Administered 2015-04-09: 0.5 mg via INTRAVENOUS
  Filled 2015-04-09: qty 20

## 2015-04-09 MED ORDER — SODIUM CHLORIDE 0.9 % IV BOLUS (SEPSIS): 500.0000 mL | Freq: Once | INTRAVENOUS | Status: DC

## 2015-04-09 MED ORDER — ATROPINE SULFATE 0.1 MG/ML IJ SOLN
INTRAMUSCULAR | Status: AC
  Filled 2015-04-09: qty 10

## 2015-04-09 MED ORDER — PIPERACILLIN-TAZOBACTAM 3.375 G IVPB
3.3750 g | Freq: Once | INTRAVENOUS | Status: AC
  Administered 2015-04-09: 3.375 g via INTRAVENOUS
  Filled 2015-04-09: qty 50

## 2015-04-09 MED ORDER — SODIUM CHLORIDE 0.9% FLUSH: 3.0000 mL | Freq: Two times a day (BID) | INTRAVENOUS | Status: DC

## 2015-04-09 MED ORDER — VANCOMYCIN HCL IN DEXTROSE 1-5 GM/200ML-% IV SOLN
1000.0000 mg | Freq: Once | INTRAVENOUS | Status: AC
  Administered 2015-04-09: 1000 mg via INTRAVENOUS
  Filled 2015-04-09: qty 200

## 2015-04-09 MED ORDER — LORAZEPAM 2 MG/ML IJ SOLN
0.5000 mg | INTRAMUSCULAR | Status: DC | PRN
  Administered 2015-04-09 – 2015-04-10 (×3): 0.5 mg via INTRAVENOUS
  Filled 2015-04-09 (×4): qty 1

## 2015-04-09 MED ORDER — HEPARIN SODIUM (PORCINE) 5000 UNIT/ML IJ SOLN: 5000.0000 [IU] | Freq: Three times a day (TID) | INTRAMUSCULAR | Status: DC

## 2015-04-09 MED ORDER — ATROPINE SULFATE 0.1 MG/ML IJ SOLN
0.5000 mg | Freq: Once | INTRAMUSCULAR | Status: AC
  Administered 2015-04-09: 0.5 mg via INTRAVENOUS

## 2015-04-09 MED ORDER — GLYCOPYRROLATE 0.2 MG/ML IJ SOLN
0.1000 mg | Freq: Four times a day (QID) | INTRAMUSCULAR | Status: DC | PRN
  Administered 2015-04-11: 09:00:00 0.1 mg via INTRAVENOUS
  Filled 2015-04-09 (×2): qty 0.5

## 2015-04-09 MED ORDER — MORPHINE SULFATE (PF) 2 MG/ML IV SOLN
2.0000 mg | INTRAVENOUS | Status: DC | PRN
  Administered 2015-04-09 – 2015-04-10 (×3): 2 mg via INTRAVENOUS
  Filled 2015-04-09 (×3): qty 1

## 2015-04-09 MED ORDER — LEVOFLOXACIN IN D5W 750 MG/150ML IV SOLN: 750.0000 mg | Freq: Once | INTRAVENOUS | Status: DC

## 2015-04-09 NOTE — ED Notes (Signed)
Family provided with update per Dr. Clayton Bibles at this time

## 2015-04-09 NOTE — ED Notes (Signed)
Pt in via EMS from [facility]; facility called out due to "unresponsive episode" in bath this morning and decreased LOC since then.  Pt baseline LOC unknown.  Pt alert, eyes opening to voice, nonverbal.  Pt bradycardic upon arrival, other vitals WDL.

## 2015-04-09 NOTE — Consult Note (Signed)
Pharmacy Antibiotic Note  Paige James is a 82 y.o. female admitted on 04/07/2015 with pneumonia.  Pharmacy has been consulted for Vancomycin and Zosyn dosing.  Plan: Vancomycin '750mg'$  IV every 48 hours.  Goal trough 15-20 mcg/mL. Zosyn 3.375gm EIV q12hrs.   Adjusted BW= 64 kg Patient's estimated CrCl is ~38m/min.  Ke= 0.012 hr-1 T1/2= 57 hrs Vd= 54.6 L Cpk= 26.79, Ctr= 15.24 (expect some accumulation due to longer half life)  Ordered Vancomycin stacked dosing 7 hours after first dose. Will check vancomycin random level prior to 3rd dose on 2/13 at 2330.   Pharmacy will continue to monitor for renal function changes requires dose adjustment.     Temp (24hrs), Avg:86.4 F (30.2 C), Min:86.4 F (30.2 C), Max:86.4 F (30.2 C)   Recent Labs Lab 04/22/2015 1315  WBC 5.2  CREATININE 4.05*    CrCl cannot be calculated (Unknown ideal weight.).    No Known Allergies  Anti-infectives    Start     Dose/Rate Route Frequency Ordered Stop   04/10/15 0300  piperacillin-tazobactam (ZOSYN) IVPB 3.375 g     3.375 g 12.5 mL/hr over 240 Minutes Intravenous Every 12 hours 04/08/2015 1639     04/10/15 0000  vancomycin (VANCOCIN) IVPB 750 mg/150 ml premix     750 mg 150 mL/hr over 60 Minutes Intravenous Every 48 hours 04/16/2015 1824     04/20/2015 1600  levofloxacin (LEVAQUIN) IVPB 500 mg     500 mg 100 mL/hr over 60 Minutes Intravenous Every 24 hours 04/13/2015 1603     04/14/2015 1500  vancomycin (VANCOCIN) IVPB 1000 mg/200 mL premix     1,000 mg 200 mL/hr over 60 Minutes Intravenous  Once 04/24/2015 1454 04/25/2015 1819   04/08/2015 1500  piperacillin-tazobactam (ZOSYN) IVPB 3.375 g     3.375 g 12.5 mL/hr over 240 Minutes Intravenous  Once 03/31/2015 1454 04/12/2015 1532   04/04/2015 1500  levofloxacin (LEVAQUIN) IVPB 750 mg     750 mg 100 mL/hr over 90 Minutes Intravenous  Once 04/21/2015 1454        Dose adjustments this admission:   Results for orders placed or performed during the hospital  encounter of 03/05/15  Urine culture     Status: None   Collection Time: 03/05/15 11:38 AM  Result Value Ref Range Status   Specimen Description URINE, RANDOM  Final   Special Requests NONE  Final   Culture   Final    >=100,000 COLONIES/mL ESCHERICHIA COLI CRITICAL RESULT CALLED TO, READ BACK BY AND VERIFIED WITH: [email or web address] [email or web address]@ [id number] [facility]   Report Status 03/08/2015 FINAL  Final   Organism ID, Bacteria ESCHERICHIA COLI  Final      Susceptibility   Escherichia coli - MIC*    Extended ESBL Value in next row Sensitive      NEGATIVECORRECTED ON 03/09/2015 at 1055: PREVIOUSLY REPORTED AS POS    AMPICILLIN/SULBACTAM Value in next row Sensitive      SENSITIVE<=2    PIP/TAZO Value in next row Sensitive      SENSITIVE<=4    CEFTRIAXONE Value in next row Sensitive      SENSITIVE<=1    IMIPENEM Value in next row Sensitive      SENSITIVE0.5    GENTAMICIN Value in next row Sensitive      SENSITIVE<=1    CIPROFLOXACIN Value in next row Resistant      RESISTANT>=4    NITROFURANTOIN Value in next row Sensitive  SENSITIVE<=16    TRIMETH/SULFA Value in next row Sensitive      SENSITIVE<=20    * >=100,000 COLONIES/mL ESCHERICHIA COLI  CULTURE, BLOOD (ROUTINE X 2) w Reflex to PCR ID Panel     Status: None   Collection Time: 03/05/15  6:44 PM  Result Value Ref Range Status   Specimen Description BLOOD LEFT ASSIST CONTROL  Final   Special Requests BOTTLES DRAWN AEROBIC AND ANAEROBIC 4CC  Final   Culture NO GROWTH 6 DAYS  Final   Report Status 03/11/2015 FINAL  Final  CULTURE, BLOOD (ROUTINE X 2) w Reflex to PCR ID Panel     Status: None   Collection Time: 03/05/15  7:54 PM  Result Value Ref Range Status   Specimen Description BLOOD LEFT ASSIST CONTROL  Final   Special Requests BOTTLES DRAWN AEROBIC AND ANAEROBIC 4CC  Final   Culture NO GROWTH 6 DAYS  Final   Report Status 03/11/2015 FINAL  Final     Thank you for allowing pharmacy to be a part of this patient's  care.  Vena Rua 04/02/2015 6:25 PM

## 2015-04-09 NOTE — ED Notes (Signed)
Family updated.

## 2015-04-09 NOTE — Progress Notes (Signed)
Comfort care explained in length with daughter, granddaughter and grandson-at that point they agreed they wanted to initiate comfort care.

## 2015-04-09 NOTE — Procedures (Signed)
Right US guided thoracentesis  Complications:  None  Blood Loss: none  See dictation in canopy pacs

## 2015-04-09 NOTE — ED Notes (Signed)
Patient transported to Ultrasound; for thoracentesis procedure.

## 2015-04-09 NOTE — ED Notes (Signed)
Attempted to call report; notified of "protected time between [place].  Will call back.

## 2015-04-09 NOTE — ED Notes (Signed)
Patient transported to CT 

## 2015-04-09 NOTE — ED Provider Notes (Signed)
[facility] Emergency Department Provider Note  ____________________________________________  Time seen: Approximately 1:39 PM  I have reviewed the triage vital signs and the nursing notes.   HISTORY  Chief Complaint Altered Mental Status  Caveat - history of present illness review of systems Limited due to the patient's altered mental status. All information is obtained from staff at her living facility.  HPI Paige James is a 82 y.o. female with CKD IV, HTN, dementia, CHF, afib who presents for evaluation of altered mental status today. According to her staff at her living facility, she has been treated with doxycycline over the past 3 days for pneumonia. Today while they were bathing her she had an unresponsive episode. She's had no vomiting or diarrhea, no recent falls. She has been nonverbal today which is not her baseline.   Past Medical History  Diagnosis Date  . Renal disorder     CKD stage 4  . Diabetes mellitus without complication ([place])   . Thyroid disease     Hypothyroidism  . Senile dementia   . Hypertension   . Atopic dermatitis   . Osteoarthritis   . Depressive disorder   . Atrial fibrillation ([place])   . Breast cancer [facility])     s/p right mastectomy  . Congestive heart failure (CHF) (H[facility])     Ejection fraction of 45-50%    Patient Active Problem List   Diagnosis Date Noted  . Escherichia coli (E. coli) infection 03/10/2015  . Dehydration 03/10/2015  . Dysphagia 03/10/2015  . Dementia with behavioral disturbance 03/10/2015  . Chronic systolic CHF (congestive heart failure) ([place]) 03/10/2015  . Anemia of chronic disease 03/10/2015  . Urinary retention 03/10/2015  . Acute on chronic renal failure ([place])   . Hypernatremia   . Weakness   . Acute pyelonephritis 03/05/2015  . Knee pain 12/21/2014  . Encephalopathy, metabolic 14/43/1540  . Acute delirium 12/17/2014  . Acute respiratory failure with hypoxia ([place]) 12/17/2014  . COPD  exacerbation ([place]) 12/17/2014  . Acute bronchitis 12/17/2014  . Pleural effusion 12/17/2014  . Chronic kidney disease (CKD), stage IV (severe) ([place])   . Acute pulmonary edema (HCC)   . Acute on chronic combined systolic and diastolic CHF (congestive heart failure) ([place])   . Hypoxia   . Persistent atrial fibrillation ([place])   . SOB (shortness of breath)   . Wheezing   . Atrial fibrillation with RVR ([place])   . CHF (congestive heart failure) ([place]) 12/08/2014    Past Surgical History  Procedure Laterality Date  . Cataract surgery    . Mastectomy      right sided mastectomy  . Hip surgery    . Right knee surgery    . Left knee surgery      Current Outpatient Rx  Name  Route  Sig  Dispense  Refill  . acetaminophen (TYLENOL) 325 MG tablet   Oral   Take 650 mg by mouth 3 (three) times daily.         Marland Kitchen albuterol (PROVENTIL) (2.5 MG/3ML) 0.083% nebulizer solution   Nebulization   Take 2.5 mg by nebulization every 6 (six) hours as needed for shortness of breath.         . carvedilol (COREG) 3.125 MG tablet   Oral   Take 3.125 mg by mouth 2 (two) times daily.          . clobetasol cream (TEMOVATE) 0.05 %   Topical   Apply 1 application topically 2 (two) times  daily as needed (for irritation). Pt applies to legs and feet.         . diltiazem (CARDIZEM) 30 MG tablet   Oral   Take 1 tablet (30 mg total) by mouth 4 (four) times daily.   120 tablet   6     Please hold for systolic blood pressure below 120  ...   . docusate sodium (COLACE) 100 MG capsule   Oral   Take 1 capsule (100 mg total) by mouth 2 (two) times daily.   10 capsule   0   . doxycycline (VIBRA-TABS) 100 MG tablet   Oral   Take 100 mg by mouth 2 (two) times daily.         . ferrous sulfate 325 (65 FE) MG tablet   Oral   Take 325 mg by mouth 3 (three) times daily.         Marland Kitchen FLUoxetine (PROZAC) 10 MG capsule   Oral   Take 10 mg by mouth daily.         Marland Kitchen glipiZIDE (GLUCOTROL) 5 MG tablet    Oral   Take 5 mg by mouth 2 (two) times daily with a meal.         . hydrALAZINE (APRESOLINE) 25 MG tablet   Oral   Take 25 mg by mouth 3 (three) times daily.         . isosorbide mononitrate (IMDUR) 30 MG 24 hr tablet   Oral   Take 1 tablet (30 mg total) by mouth daily.   30 tablet   6   . levocetirizine (XYZAL) 5 MG tablet   Oral   Take 5 mg by mouth daily.         Marland Kitchen levothyroxine (SYNTHROID, LEVOTHROID) 137 MCG tablet   Oral   Take 137 mcg by mouth daily before breakfast.         . LORazepam (ATIVAN) 0.5 MG tablet   Oral   Take 0.5 mg by mouth 2 (two) times daily as needed (for agitation).         . Multiple Vitamin (MULTIVITAMIN WITH MINERALS) TABS tablet   Oral   Take 1 tablet by mouth daily.         . Olopatadine HCl (PAZEO) 0.7 % SOLN   Both Eyes   Place 1 drop into both eyes daily.         Marland Kitchen oxybutynin (DITROPAN-XL) 10 MG 24 hr tablet   Oral   Take 10 mg by mouth daily.         Marland Kitchen oxyCODONE (OXY IR/ROXICODONE) 5 MG immediate release tablet   Oral   Take 5 mg by mouth every 6 (six) hours as needed for moderate pain.         . pantoprazole (PROTONIX) 40 MG tablet   Oral   Take 40 mg by mouth daily.         . polyethylene glycol (MIRALAX / GLYCOLAX) packet   Oral   Take 17 g by mouth daily as needed for mild constipation.   14 each   0   . sodium bicarbonate 650 MG tablet   Oral   Take 650 mg by mouth 2 (two) times daily.         . Vitamin D, Ergocalciferol, (DRISDOL) 50000 UNITS CAPS capsule   Oral   Take 50,000 Units by mouth every 7 (seven) days. Pt takes on Wednesday.           Allergies Review  of patient's allergies indicates no known allergies.  Family History  Problem Relation Age of Onset  . Cancer - Prostate Brother   . Colon cancer Daughter   . Leukemia Daughter     Social History Social History  Substance Use Topics  . Smoking status: Never Smoker   . Smokeless tobacco: None  . Alcohol Use: No     Review of Systems  Caveat - history of present illness review of systems Limited due to the patient's altered mental status. All information is obtained from staff at her living facility. ____________________________________________   PHYSICAL EXAM:  VITAL SIGNS: ED Triage Vitals  Enc Vitals Group     BP 04/08/2015 1315 108/47 mmHg     Pulse Rate 04/08/2015 1315 48     Resp --      Temp --      Temp src --      SpO2 03/31/2015 1315 91 %     Weight --      Height --      Head Cir --      Peak Flow --      Pain Score --      Pain Loc --      Pain Edu? --      Excl. in [place]? --     Constitutional: Leaving but awakens to voice and light touch. Appears intermittently mildly agitated. Does not follow commands, does not verbalize. Eyes: Conjunctivae are normal. PERRL. EOMI. Head: Atraumatic. Nose: No congestion/rhinnorhea. Mouth/Throat: Mucous membranes are dry.  Oropharynx non-erythematous. Neck: No stridor. Appears supple without meningismus. Cardiovascular: mildly bradycarduc rate, regular rhythm. Grossly normal heart sounds.  Good peripheral circulation. Respiratory: Normal respiratory effort.  No retractions. Diminished breath sounds in the left lung fields. Gastrointestinal: Soft and nontender. No distention. No CVA tenderness. Genitourinary: deferred Musculoskeletal: No lower extremity tenderness nor edema.  No joint effusions. Neurologic:  No verbalization. The patient appears to move all extremities equally however she does not cooperate with formal neurological testing. Skin:  Skin is warm, dry. Chronic small ulceration on the right heel. Psychiatric: Unable to assess.  ____________________________________________   LABS (all labs ordered are listed, but only abnormal results are displayed)  Labs Reviewed  CBC WITH DIFFERENTIAL/PLATELET - Abnormal; Notable for the following:    RBC 2.33 (*)    Hemoglobin 7.2 (*)    HCT 22.3 (*)    RDW 21.7 (*)    Platelets 110  (*)    Lymphs Abs 0.6 (*)    All other components within normal limits  COMPREHENSIVE METABOLIC PANEL - Abnormal; Notable for the following:    BUN 49 (*)    Creatinine, Ser 4.05 (*)    Calcium 8.0 (*)    Total Protein 6.0 (*)    Albumin 2.2 (*)    GFR calc non Af Amer 9 (*)    GFR calc Af Amer 10 (*)    All other components within normal limits  CULTURE, BLOOD (ROUTINE X 2)  CULTURE, BLOOD (ROUTINE X 2)  TROPONIN I  URINALYSIS COMPLETEWITH MICROSCOPIC ([facility] ONLY)   ____________________________________________  EKG  ED ECG REPORT I, Joanne Gavel, the attending physician, personally viewed and interpreted this ECG.   Date: 04/08/2015  EKG Time: 14:42  Rate: 78  Rhythm: normal sinus rhythm with PACs  Axis: left  Intervals:right bundle branch block  ST&T Change: No acute ST elevation.  ____________________________________________  RADIOLOGY  CXR  IMPRESSION: Moderate right pleural effusion. Underlying consolidation right mid  to lower lung zone could represent pneumonia or atelectasis.  Mild but increased left lower lobe opacity which could represent atelectasis or pneumonia.  CT head IMPRESSION: Atrophy with periventricular small vessel disease, stable. No intracranial mass, hemorrhage, or extra-axial fluid. No evidence of acute appearing infarct. There are areas of paranasal sinus disease.  ____________________________________________   PROCEDURES  Procedure(s) performed: None  Critical Care performed: Yes. Total critical care time spent 30 minutes.  ____________________________________________   INITIAL IMPRESSION / ASSESSMENT AND PLAN / ED COURSE  Pertinent labs & imaging results that were available during my care of the patient were reviewed by me and considered in my medical decision making (see chart for details).  Paige James is a 82 y.o. female with CKD IV, HTN, dementia, CHF, afib who presents for evaluation of altered mental status  today. On exam she is mildly bradycardic but maintaining adequate blood pressure with systolic in the low 277A. She awakens to voice and light touch but she does not follow commands or verbalize. Plan for screening labs, chest x-ray, CT head, urinalysis, anticipate admission.  ----------------------------------------- 2:46 PM on 04/22/2015 -----------------------------------------  I was called call to the bedside because the patient experience bradycardia, heart rate in the 20s, without a pulse. She is a DO NOT RESUSCITATE according to the MOST form which accompanies her. Heart rate improved to the 70s with atropine. Currently sinus bradycardia on EKG. chest x-ray shows worsening left lower lobe opacity. IV vancomycin, Zosyn, Levaquin ordered for treatment of healthcare associated pneumonia given recent admission 3 months ago. He is elevated at 4.05, IV fluids given. Negative troponin. CBC notable for anemia, hemoglobin 7.2 which appears to be her baseline. I discussed her care extensively with her healthcare power of attorney Ms. Maxwell Caul who confirms that the patient is DO NOT RESUSCITATE, DO NOT INTUBATE, she reports patient would not want a pacemaker or any invasive intervention  and was very clear that "when it was her time to go she wanted to go peacefully". Case discussed with hospitalist for admission. ____________________________________________   FINAL CLINICAL IMPRESSION(S) / ED DIAGNOSES  Final diagnoses:  Altered mental status, unspecified altered mental status type  Bradycardia  Healthcare-associated pneumonia      Joanne Gavel, MD 04/01/2015 [phone]

## 2015-04-09 NOTE — H&P (Addendum)
[place] [facility] [place] NAME: Paige James    MR#:  [id number]  DATE OF BIRTH:  1921-01-09  DATE OF ADMISSION:  04/07/2015  PRIMARY CARE PHYSICIAN: Pcp Not In System   REQUESTING/REFERRING PHYSICIAN:   CHIEF COMPLAINT:   Chief Complaint  Patient presents with  . Altered Mental Status    HISTORY OF PRESENT ILLNESS: Paige James  is a 82 y.o. female with a known history of chronic renal failure. CK D stage IV, diabetes, metastatic. 2. Hypothyroidism. Dementia, recent admission for urinary tract infection, acute on chronic renal failure, acute urinary retention requiring in and out catheterization intermittently presents back to the hospital with unresponsive episode in the bathtub in the the facility. On arrival to the hospital, she was alert to voice and to call her was agitated labs revealed acute on chronic renal failure. Prescribe now 4.05 as compared to 2.31on  January 10th, chest x-ray showed right pleural effusion. While in the emergency room, patient became unresponsive and had an episode of asystole. Atropine was administered and patient regained pulse. Hospitalist services were contacted for admission. Patient herself is not able to provide review of systems  PAST MEDICAL HISTORY:   Past Medical History  Diagnosis Date  . Renal disorder     CKD stage 4  . Diabetes mellitus without complication ([place])   . Thyroid disease     Hypothyroidism  . Senile dementia   . Hypertension   . Atopic dermatitis   . Osteoarthritis   . Depressive disorder   . Atrial fibrillation ([place])   . Breast cancer [facility])     s/p right mastectomy  . Congestive heart failure (CHF) (HCC)     Ejection fraction of 45-50%    PAST SURGICAL HISTORY: Past Surgical History  Procedure Laterality Date  . Cataract surgery    . Mastectomy      right sided mastectomy  . Hip surgery    . Right knee surgery    . Left knee surgery      SOCIAL HISTORY:  Social  History  Substance Use Topics  . Smoking status: Never Smoker   . Smokeless tobacco: Not on file  . Alcohol Use: No    FAMILY HISTORY:  Family History  Problem Relation Age of Onset  . Cancer - Prostate Brother   . Colon cancer Daughter   . Leukemia Daughter     DRUG ALLERGIES: No Known Allergies  Review of Systems  Unable to perform ROS: medical condition    MEDICATIONS AT HOME:  Prior to Admission medications   Medication Sig Start Date End Date Taking? Authorizing Provider  acetaminophen (TYLENOL) 325 MG tablet Take 650 mg by mouth 3 (three) times daily.   Yes Historical Provider, MD  albuterol (PROVENTIL) (2.5 MG/3ML) 0.083% nebulizer solution Take 2.5 mg by nebulization every 6 (six) hours as needed for shortness of breath.   Yes Historical Provider, MD  carvedilol (COREG) 3.125 MG tablet Take 3.125 mg by mouth 2 (two) times daily.    Yes Historical Provider, MD  clobetasol cream (TEMOVATE) 1.66 % Apply 1 application topically 2 (two) times daily as needed (for irritation). Pt applies to legs and feet.   Yes Historical Provider, MD  diltiazem (CARDIZEM) 30 MG tablet Take 1 tablet (30 mg total) by mouth 4 (four) times daily. 03/10/15  Yes Theodoro Grist, MD  docusate sodium (COLACE) 100 MG capsule Take 1 capsule (100 mg total) by mouth 2 (two) times  daily. 03/10/15  Yes Theodoro Grist, MD  doxycycline (VIBRA-TABS) 100 MG tablet Take 100 mg by mouth 2 (two) times daily. 04/06/15 04/12/15 Yes Historical Provider, MD  ferrous sulfate 325 (65 FE) MG tablet Take 325 mg by mouth 3 (three) times daily.   Yes Historical Provider, MD  FLUoxetine (PROZAC) 10 MG capsule Take 10 mg by mouth daily.   Yes Historical Provider, MD  glipiZIDE (GLUCOTROL) 5 MG tablet Take 5 mg by mouth 2 (two) times daily with a meal.   Yes Historical Provider, MD  hydrALAZINE (APRESOLINE) 25 MG tablet Take 25 mg by mouth 3 (three) times daily.   Yes Historical Provider, MD  isosorbide mononitrate (IMDUR) 30 MG 24 hr  tablet Take 1 tablet (30 mg total) by mouth daily. 12/18/14  Yes Theodoro Grist, MD  levocetirizine (XYZAL) 5 MG tablet Take 5 mg by mouth daily.   Yes Historical Provider, MD  levothyroxine (SYNTHROID, LEVOTHROID) 137 MCG tablet Take 137 mcg by mouth daily before breakfast.   Yes Historical Provider, MD  LORazepam (ATIVAN) 0.5 MG tablet Take 0.5 mg by mouth 2 (two) times daily as needed (for agitation).   Yes Historical Provider, MD  Multiple Vitamin (MULTIVITAMIN WITH MINERALS) TABS tablet Take 1 tablet by mouth daily.   Yes Historical Provider, MD  Olopatadine HCl (PAZEO) 0.7 % SOLN Place 1 drop into both eyes daily.   Yes Historical Provider, MD  oxybutynin (DITROPAN-XL) 10 MG 24 hr tablet Take 10 mg by mouth daily.   Yes Historical Provider, MD  oxyCODONE (OXY IR/ROXICODONE) 5 MG immediate release tablet Take 5 mg by mouth every 6 (six) hours as needed for moderate pain.   Yes Historical Provider, MD  pantoprazole (PROTONIX) 40 MG tablet Take 40 mg by mouth daily.   Yes Historical Provider, MD  polyethylene glycol (MIRALAX / GLYCOLAX) packet Take 17 g by mouth daily as needed for mild constipation. 03/10/15  Yes Theodoro Grist, MD  sodium bicarbonate 650 MG tablet Take 650 mg by mouth 2 (two) times daily.   Yes Historical Provider, MD  Vitamin D, Ergocalciferol, (DRISDOL) 50000 UNITS CAPS capsule Take 50,000 Units by mouth every 7 (seven) days. Pt takes on Wednesday.   Yes Historical Provider, MD      PHYSICAL EXAMINATION:   VITAL SIGNS: Blood pressure 123/71, pulse 66, resp. rate 19, SpO2 88 %.  GENERAL:  82 y.o.-year-old patient lying in the bed in moderate distress, restless, somewhat tachypneic, uncomfortable. Able to halfway open eyes but nonverbal, intermittently follows commands .  EYES: Pupils equal, round, reactive to light and accommodation. No scleral icterus. Extraocular muscles intact.  HEENT: Head atraumatic, normocephalic. Oropharynx and nasopharynx clear.  NECK:  Supple, no  jugular venous distention. No thyroid enlargement, no tenderness.  LUNGS Markedly diminished breath sounds on the right, good air entrance on the left with some rhonchi. Rales , coarse sounds, no wheezing,.  Using accessory  muscles of respiration, especially when agitated, restless   CARDIOVASCULAR: S1, S2 normal, bradycardic, distant. No murmurs, rubs, or gallops.  ABDOMEN: Soft, nontender, nondistended. Bowel sounds present. No organomegaly or mass.  EXTREMITIES: Tace pedal edema, no nosis, or clubbing.  heel ulceration was noted on the right with some serous discharge  NEUROLOGIC: Cranial nerves II through XII are grossly  intact. Muscle strength  not able to assess. However, patient is able to move all  Extremities, although right-sided extremities are weaker Sensation able to assess . Gait not checked.  PSYCHIATRIC: The patient is mnolent, not  able to assess orientation,  nonverbal, intermittently follows commands ,  SKIN: No obvious rash, lesion, or ulcer, , except right heel ulceration   LABORATORY PANEL:   CBC  Recent Labs Lab 04/23/2015 1315  WBC 5.2  HGB 7.2*  HCT 22.3*  PLT 110*  MCV 95.5  MCH 30.7  MCHC 32.2  RDW 21.7*  LYMPHSABS 0.6*  MONOABS 0.3  EOSABS 0.1  BASOSABS 0.0   ------------------------------------------------------------------------------------------------------------------  Chemistries   Recent Labs Lab 04/17/2015 1315  NA 145  K 3.7  CL 109  CO2 27  GLUCOSE 93  BUN 49*  CREATININE 4.05*  CALCIUM 8.0*  AST 30  ALT 27  ALKPHOS 91  BILITOT 0.7   ------------------------------------------------------------------------------------------------------------------  Cardiac Enzymes  Recent Labs Lab 04/23/2015 1315  TROPONINI 0.03   ------------------------------------------------------------------------------------------------------------------  RADIOLOGY: Ct Head Wo Contrast  04/17/2015  CLINICAL DATA:  Unresponsive episode earlier today  with altered level of consciousness since this episode. EXAM: CT HEAD WITHOUT CONTRAST TECHNIQUE: Contiguous axial images were obtained from the base of the skull through the vertex without intravenous contrast. COMPARISON:  April 06, 2013 FINDINGS: Moderate diffuse atrophy is stable. There is no intracranial mass, hemorrhage, extra-axial fluid collection, or midline shift. There is patchy small vessel disease throughout the centra semiovale bilaterally, stable. There is no new gray-white compartment lesion. No acute infarct is evident. The bony calvarium appears intact. The mastoid air cells are clear. No intraorbital lesions are apparent. There is calcification in the cavernous carotid artery regions bilaterally. There is left medial and right lateral sphenoid sinus disease. There is mucosal thickening in several ethmoid air cells bilaterally. IMPRESSION: Atrophy with periventricular small vessel disease, stable. No intracranial mass, hemorrhage, or extra-axial fluid. No evidence of acute appearing infarct. There are areas of paranasal sinus disease. Electronically Signed   By: Lowella Grip III M.D.   On: 04/14/2015 14:02   Dg Chest Portable 1 View  04/02/2015  CLINICAL DATA:  Bradycardia congestive heart failure atrial fibrillation dementia breast cancer EXAM: PORTABLE CHEST 1 VIEW COMPARISON:  03/06/2015 FINDINGS: Moderately to severely enlarged cardiac silhouette stable. Central line has been removed. There is vascular congestion. There is a moderate right pleural effusions similar to the prior study. There is underlying consolidation which may represent atelectasis or pneumonia. There is retrocardiac opacity on the left, mildly increased in conspicuity and severity. There is no definite evidence of pulmonary edema IMPRESSION: Moderate right pleural effusion. Underlying consolidation right mid to lower lung zone could represent pneumonia or atelectasis. Mild but increased left lower lobe opacity which  could represent atelectasis or pneumonia. Electronically Signed   By: Skipper Cliche M.D.   On: 04/03/2015 14:25    EKG: Orders placed or performed during the hospital encounter of 04/10/2015  . ED EKG  . ED EKG  . EEG reveals sinus rhythm at 70 bpm, left axis deviation, right bundle-branch block, nonspecific ECG changes   IMPRESSION AND PLAN:  Active Problems:   Acute on chronic respiratory failure with hypoxia (HCC)   Asystole (HCC)   Pleural effusion, right   Unresponsive episode   Left lower lobe pneumonia   Anemia   Thrombocytopenia (HCC)  #1 acute on chronic respiratory failure with hypoxia , likely due to congestive heart failure, pleural effusion, worse on the right , patient would benefit from a thoracentesis . I discussed patient's case was power of attorney for medical care. DSS Mrs. Maxwell Caul , who is going to discuss this family and make decisions #2 asystole. I'm  concerned that patient's asystole could have been related to pleural effusion, we'll admit patient to telemetry and but would not administer atropine.  Patient is DO NOT RESUSCITATE  #3. Left lower lobe pneumonia, question aspiration versus healthcare associated pneumonia. Continue patient on levofloxacin, vancomycin and Zosyn ,  #4. Acute on chronic renal failure , Place Foley catheter , follow ins and outs as well as patient's weight , initiate IV fluids as soon as thoracentesis is performed   #5. Episode of unresponsiveness, could be related to the same episodes of asystole in the emergency room   #6. Anemia, etiology is unclear. Could be  Anemia of chronic disease, transfuse as needed, if power of attorney for medical care s agreeable #7. Thrombocytopenia, likely consumption, follow labs in am   All the records are reviewed and case discussed with ED provider. Management plans discussed with the patient, family and they are in agreement.  CODE STATUS: Code Status History    Date Active Date Inactive Code  Status Order ID Comments User Context   03/05/2015  5:00 PM 03/10/2015 10:10 PM Full Code [id number]  Gladstone Lighter, MD Inpatient   12/21/2014  5:16 PM 12/22/2014  4:06 PM DNR [id number]  Gladstone Lighter, MD ED   12/08/2014  8:31 PM 12/18/2014  8:53 PM DNR [id number]  Max Sane, MD Inpatient   12/08/2014  4:40 PM 12/08/2014  8:31 PM DNR [id number]  Max Sane, MD ED    Advance Directive Documentation        Most Recent Value   Type of Advance Directive  Healthcare Power of [place], Living will, Out of facility DNR (pink MOST or yellow form)   Pre-existing out of facility DNR order (yellow form or pink MOST form)  Pink MOST form placed in chart (order not valid for inpatient use)   "MOST" Form in Place?         TOTAL TIME TAKING CARE OF THIS PATIENT: 110 minutes This is patient's daughter, grandson, DSS, power of attorney for medical care. Mrs. Osborne telephone number [phone]  Discussed again his family about palliative care measures, family decided against it and try IV fluids for hypotension and renal failure, IV fluid order is placed.  Discussed again with power of attorney for medical care DSS. Mrs. Maxwell Caul, who authorized patient's comfort care measures due to acute renal failure, not responsive to IV fluid administration Trixy Loyola M.D on 04/21/2015 at 3:43 PM  Between 7am to 6pm - Pager - [phone] After 6pm go to [email or web address] - password [email or web address] [place] [facility]  Office  [phone]  CC: Primary care physician; Pcp Not In System

## 2015-04-09 NOTE — ED Notes (Signed)
Temperature foley placed. Monitor reading 85.1. Rectal temp taken with reading of 86.4. Admitting Dr made aware. Patient placed on [facility] at this time. Will continue to monitor.

## 2015-04-09 NOTE — ED Notes (Signed)
Attempting to in/out cath pt.  Pt went asystole momentarily, unable to feel carotid pulse, unable to auscultate heart sounds. MD notified and at bedside.  Pt regained sinus brady heart rate in the 30's.  0.5 atropine given per Dr. Edd Fabian.  Pt MOST form at bedside during this incident.

## 2015-04-09 NOTE — ED Notes (Signed)
Admitting MD made aware of pt rectal temp.  Bear hugger placed.

## 2015-04-10 NOTE — Progress Notes (Signed)
[place] [place] NAME: Paige James    MR#:  [id number]  DATE OF BIRTH:  09-05-1920  SUBJECTIVE:  Patient unable to provide information given mental status and daughter present at bedside, no complaints this morning, made comfort measures yesterday afternoon  REVIEW OF SYSTEMS:  Patient unable to provide information given mental status  DRUG ALLERGIES:  No Known Allergies  VITALS:  Blood pressure 89/41, pulse 44, temperature 86.4 F (30.2 C), temperature source Rectal, resp. rate 16, height '5\' 6"'$  (1.676 m), weight 84.142 kg (185 lb 8 oz), SpO2 100 %.  PHYSICAL EXAMINATION:   VITAL SIGNS: Filed Vitals:   04/12/2015 2033 04/10/15 0530  BP: 106/78 89/41  Pulse: 54 44  Temp:    Resp: 70 16   GENERAL:82 y.o.female resting comfortably HEAD: Normocephalic, atraumatic.  EYES: Pupils equal, round, reactive to light. Unable to assess extraocular muscles given mental status/medical condition. No scleral icterus.  MOUTH: Dry mucosal membrane. Dentition intact. No abscess noted.  EAR, NOSE, THROAT: Clear without exudates. No external lesions.  NECK: Supple. No thyromegaly. No nodules. No JVD.  PULMONARY: Clear to ascultation, without wheeze rails or rhonci. No use of accessory muscles, Good respiratory effort. good air entry bilaterally CHEST: Nontender to palpation.  CARDIOVASCULAR: S1 and S2. Regular rate and rhythm. No murmurs, rubs, or gallops. No edema. Pedal pulses 2+ bilaterally.  GASTROINTESTINAL: Soft, nontender, nondistended. No masses. Positive bowel sounds. No hepatosplenomegaly.  MUSCULOSKELETAL: No swelling, clubbing, or edema. Range of motion full in all extremities.  NEUROLOGIC: Unable to assess given mental status/medical condition SKIN: No ulceration, lesions, rashes, or cyanosis. Skin warm and dry. Turgor intact.  PSYCHIATRIC: Unable to assess given mental status/medical condition        LABORATORY PANEL:    CBC  Recent Labs Lab 04/01/2015 1315  WBC 5.2  HGB 7.2*  HCT 22.3*  PLT 110*   ------------------------------------------------------------------------------------------------------------------  Chemistries   Recent Labs Lab 04/15/2015 1315  NA 145  K 3.7  CL 109  CO2 27  GLUCOSE 93  BUN 49*  CREATININE 4.05*  CALCIUM 8.0*  AST 30  ALT 27  ALKPHOS 91  BILITOT 0.7   ------------------------------------------------------------------------------------------------------------------  Cardiac Enzymes  Recent Labs Lab 04/23/2015 1315  TROPONINI 0.03   ------------------------------------------------------------------------------------------------------------------  RADIOLOGY:  Dg Chest 1 View  04/08/2015  CLINICAL DATA:  82 year old female status post right thoracentesis for pleural effusion. EXAM: CHEST 1 VIEW COMPARISON:  04/13/2015 and prior radiographs FINDINGS: A right pleural effusion has decreased. There is no evidence of pneumothorax. This is a low volume film with continued bilateral lower lung consolidation/atelectasis. A small left pleural effusion is likely. A defibrillator pad overlying the chest is noted. No other changes identified. IMPRESSION: Decreased right pleural effusion following thoracentesis. No evidence of pneumothorax. Continued bilateral lower lung consolidation/atelectasis and probable small left effusion. Electronically Signed   By: Margarette Canada M.D.   On: 04/21/2015 17:31   Ct Head Wo Contrast  04/18/2015  CLINICAL DATA:  Unresponsive episode earlier today with altered level of consciousness since this episode. EXAM: CT HEAD WITHOUT CONTRAST TECHNIQUE: Contiguous axial images were obtained from the base of the skull through the vertex without intravenous contrast. COMPARISON:  April 06, 2013 FINDINGS: Moderate diffuse atrophy is stable. There is no intracranial mass, hemorrhage, extra-axial fluid collection, or midline shift. There is patchy small  vessel disease throughout the centra semiovale bilaterally, stable. There is no new gray-white compartment lesion. No acute infarct is  evident. The bony calvarium appears intact. The mastoid air cells are clear. No intraorbital lesions are apparent. There is calcification in the cavernous carotid artery regions bilaterally. There is left medial and right lateral sphenoid sinus disease. There is mucosal thickening in several ethmoid air cells bilaterally. IMPRESSION: Atrophy with periventricular small vessel disease, stable. No intracranial mass, hemorrhage, or extra-axial fluid. No evidence of acute appearing infarct. There are areas of paranasal sinus disease. Electronically Signed   By: Lowella Grip III M.D.   On: 04/21/2015 14:02   Dg Chest Portable 1 View  04/23/2015  CLINICAL DATA:  Bradycardia congestive heart failure atrial fibrillation dementia breast cancer EXAM: PORTABLE CHEST 1 VIEW COMPARISON:  03/06/2015 FINDINGS: Moderately to severely enlarged cardiac silhouette stable. Central line has been removed. There is vascular congestion. There is a moderate right pleural effusions similar to the prior study. There is underlying consolidation which may represent atelectasis or pneumonia. There is retrocardiac opacity on the left, mildly increased in conspicuity and severity. There is no definite evidence of pulmonary edema IMPRESSION: Moderate right pleural effusion. Underlying consolidation right mid to lower lung zone could represent pneumonia or atelectasis. Mild but increased left lower lobe opacity which could represent atelectasis or pneumonia. Electronically Signed   By: Skipper Cliche M.D.   On: 04/05/2015 14:25   US Thoracentesis Asp Pleural Space W/img Guide  04/27/2015  INDICATION: Right pleural effusion EXAM: ULTRASOUND GUIDED THORACENTESIS MEDICATIONS: None. ANESTHESIA/SEDATION: None COMPLICATIONS: None immediate. PROCEDURE: An ultrasound guided thoracentesis was thoroughly discussed  with the patient's power of attorney and questions answered. The benefits, risks, alternatives and complications were also discussed. The power of attorney understands and wishes to proceed with the procedure. Written consent was obtained. Ultrasound was performed to localize and mark an adequate pocket of fluid in the right chest. The area was then prepped and draped in the normal sterile fashion. 1% Lidocaine was used for local anesthesia. Under ultrasound guidance a 6 French thoracentesis catheter was introduced. Thoracentesis was performed. The catheter was removed and a dressing applied. FINDINGS: A total of approximately 700 mL of clear yellow fluid was removed. A fluid sample was sent for laboratory analysis. IMPRESSION: Successful ultrasound guided right thoracentesis yielding 700 mL of pleural fluid. Electronically Signed   By: Inez Catalina M.D.   On: 04/06/2015 17:10    EKG:   Orders placed or performed during the hospital encounter of 04/08/2015  . ED EKG  . ED EKG    ASSESSMENT AND PLAN:   82 year old African-American female history of dementia, atrial fibrillation chronic, congestive heart failure systolic who was noted to the hospital unresponsive state. She actually required atropine in the emergency department when she lost pulse. She has multiple medical issues has been made comfort measures  1. Acute on Chronic renal failure 2. Acute on chronic respiratory failure with hypoxia 3. Disposition: The patient has been made comfort measures only continue with as needed medications, will consult palliative care as well as social work to aid in hospice placement     All the records are reviewed and case discussed with Care Management/Social Workerr. Management plans discussed with the patient, family and they are in agreement.  CODE STATUS: DO NOT RESUSCITATE/comfort measures only  TOTAL TIME TAKING CARE OF THIS PATIENT: 33 minutes.   POSSIBLE D/C IN 1-2 DAYS, DEPENDING ON  CLINICAL CONDITION.   Hower,  Karenann Cai.D on 04/10/2015 at 11:22 AM  Between 7am to 6pm - Pager - [phone]  After 6pm: House  Pager: - [place] [facility]  Office  [phone]  CC: Primary care physician; Pcp Not In System

## 2015-04-10 NOTE — Progress Notes (Signed)
Patient brought into ED asystole from [facility]. Patient's legal guardian per report is at [place], Ms. Osborne. Patient has an order for DNR. Family is at bedside requesting that the patient be given pain medication and kept comfortable. Dr. [name] notified that family is wanting patient to be kept comfortable, new orders obtained for comfort care; see new orders. Also notified Dr. [name] that unable to get oral or axillary temp on pt, no new orders as patient is now comfort care. The Agricultural consultant and myself spoke with family concerning comfort care and provided education for the family.

## 2015-04-10 NOTE — Progress Notes (Signed)
Dr. Darvin Neighbours notified PCR positive for MRSA. Clarified with Dr. Darvin Neighbours that patient is now comfort care and also on contact isolation for ESBL in the urine, no new orders at this time.

## 2015-04-10 NOTE — Progress Notes (Signed)
Nutrition Brief Note  Chart reviewed. Pt now transitioning to comfort care.  Pt continues to be NPO. No further nutrition interventions warranted at this time.  Please re-consult if change in nutritional poc.  Dwyane Luo, RD, LDN Pager [phone] Weekend/On-Call Pager [phone]

## 2015-04-12 LAB — BLOOD CULTURE ID PANEL (REFLEXED)
Acinetobacter baumannii: NOT DETECTED
CANDIDA ALBICANS: NOT DETECTED
CANDIDA GLABRATA: NOT DETECTED
Candida krusei: NOT DETECTED
Candida parapsilosis: NOT DETECTED
Candida tropicalis: NOT DETECTED
Carbapenem resistance: NOT DETECTED
ENTEROBACTER CLOACAE COMPLEX: NOT DETECTED
ENTEROBACTERIACEAE SPECIES: NOT DETECTED
ENTEROCOCCUS SPECIES: NOT DETECTED
Escherichia coli: NOT DETECTED
HAEMOPHILUS INFLUENZAE: NOT DETECTED
KLEBSIELLA PNEUMONIAE: NOT DETECTED
Klebsiella oxytoca: NOT DETECTED
LISTERIA MONOCYTOGENES: NOT DETECTED
Methicillin resistance: NOT DETECTED
NEISSERIA MENINGITIDIS: NOT DETECTED
PSEUDOMONAS AERUGINOSA: NOT DETECTED
Proteus species: NOT DETECTED
STAPHYLOCOCCUS AUREUS BCID: NOT DETECTED
STAPHYLOCOCCUS SPECIES: NOT DETECTED
STREPTOCOCCUS AGALACTIAE: NOT DETECTED
STREPTOCOCCUS PNEUMONIAE: NOT DETECTED
STREPTOCOCCUS PYOGENES: NOT DETECTED
STREPTOCOCCUS SPECIES: NOT DETECTED
Serratia marcescens: NOT DETECTED
VANCOMYCIN RESISTANCE: NOT DETECTED

## 2015-04-12 NOTE — Progress Notes (Signed)
Orpah Clinton at [facility] notified that patient has expired.

## 2015-04-12 NOTE — Progress Notes (Signed)
[facility] notified of patients death. Per Magda Paganini, patient ruled out for donation due to age. Referral number [id number].

## 2015-04-12 NOTE — Progress Notes (Signed)
[facility] notified that patient has expired.

## 2015-04-13 LAB — CYTOLOGY - NON PAP

## 2015-04-14 LAB — CULTURE, BLOOD (ROUTINE X 2): CULTURE: NO GROWTH

## 2015-04-16 LAB — BODY FLUID CULTURE: CULTURE: NO GROWTH

## 2015-04-28 NOTE — Discharge Summary (Signed)
[place] [facility] [place] NAME: Paige James    MR#:  [id number]  DATE OF BIRTH:  [birth year]  DATE OF ADMISSION:  04/21/2015 ADMITTING PHYSICIAN: Vaughan Basta, MD  DATE OF DISCHARGE: 04-23-2015  9:42 PM  PRIMARY CARE PHYSICIAN: Pcp Not In System    ADMISSION DIAGNOSIS:  Acute on chronic respiratory failure with hypoxia  Community acquired pneumonia  DISCHARGE DIAGNOSIS:  1. Acute on Chronic renal failure - with likely uremia 2. Acute on chronic respiratory failure with hypoxia 3. comfort measures only  4. deceased  SECONDARY DIAGNOSIS:   Past Medical History  Diagnosis Date  . Renal disorder     CKD stage 4  . Diabetes mellitus without complication ([place])   . Thyroid disease     Hypothyroidism  . Senile dementia   . Hypertension   . Atopic dermatitis   . Osteoarthritis   . Depressive disorder   . Atrial fibrillation ([place])   . Breast cancer [facility])     s/p right mastectomy  . Congestive heart failure (CHF) (HCC)     Ejection fraction of 45-50%    HOSPITAL COURSE:  Paige James  is a 82 y.o. female admitted 04/24/2015 with chief complaint of altered mental status. Please see H&P performed by Dr Daine Gip for further details. She was admitted to the hospital with lethargy/solumnence and found to have worsening renal function, hypoxia secondary to pleural effusion and pneumonia. Started on broad spectrum antibiotics but after much discussion the decision was made to stop antibiotics and focus on her comfort level. DNR orders placed, made comfort measures only. She died 04/23/15 21:42  DISCHARGE CONDITIONS:  deceased   CODE STATUS:  Code Status History    Date Active Date Inactive Code Status Order ID Comments User Context   04/04/2015  8:05 PM 04/12/2015  5:32 AM DNR [id number]  Theodoro Grist, MD Inpatient   03/05/2015  5:00 PM 03/10/2015 10:10 PM Full Code [id number]  Gladstone Lighter, MD Inpatient   12/21/2014  5:16 PM  12/22/2014  4:06 PM DNR [id number]  Gladstone Lighter, MD ED   12/08/2014  8:31 PM 12/18/2014  8:53 PM DNR [id number]  Max Sane, MD Inpatient   12/08/2014  4:40 PM 12/08/2014  8:31 PM DNR [id number]  Max Sane, MD ED    Questions for Most Recent Historical Code Status (Order [id number])    Question Answer Comment   In the event of cardiac or respiratory ARREST Do not call a "code blue"    In the event of cardiac or respiratory ARREST Do not perform Intubation, CPR, defibrillation or ACLS    In the event of cardiac or respiratory ARREST Use medication by any route, position, wound care, and other measures to relive pain and suffering. May use oxygen, suction and manual treatment of airway obstruction as needed for comfort.     Advance Directive Documentation        Most Recent Value   Type of Advance Directive  Healthcare Power of Attorney, Living will, Out of facility DNR (pink MOST or yellow form)   Pre-existing out of facility DNR order (yellow form or pink MOST form)  Pink MOST form placed in chart (order not valid for inpatient use)   "MOST" Form in Place?        TOTAL TIME TAKING CARE OF THIS PATIENT: 28 minutes.    Hower,  Karenann Cai.D on 04/14/2015 at 3:15 PM  Between 7am to 6pm - Pager - [phone]  After 6pm go to [email or web address] - password [email or web address] [facility]  Office  [phone]  CC: Primary care physician; Pcp Not In System

## 2015-04-28 NOTE — Progress Notes (Addendum)
Patient without pulse or respirations at 2142. Verified by 2nd RN, De Hollingshead, RN. Family is at bedside. Cha[place]in paged.

## 2015-04-28 NOTE — Progress Notes (Signed)
Pt without pulse or respirations

## 2015-04-28 NOTE — Progress Notes (Signed)
I had a lengthy discussion with multiple family members today regarding goals of care and prognosis Paige James. We once again discussed comfort measures, they were pleased to see comfort and symptom control for upfront. We discussed issues regarding renal failure, uremia I also brought that palliative care/social work will likely visit the patient tomorrow morning in regards for placement hospice however, given current blood pressure (hypotension) she may actually be too ill to be placed. If that is the case she will stay in hospital and ultimately die of her illnesses.  The family was pleased to have a lengthy discussion and have all questions answered Time spent 30 mins

## 2015-04-28 NOTE — Progress Notes (Signed)
[place] [facility] [place] NAME: Paige James    MR#:  [id number]  DATE OF BIRTH:  September 22, 1920  SUBJECTIVE:  Comfort measures only-noted to be hypotensive  Resting comfortably Unable to provide complaints given mental status medical condition  REVIEW OF SYSTEMS:  Unable to obtain due to patient's mental status medical condition  DRUG ALLERGIES:  No Known Allergies  VITALS:  Blood pressure 67/45, pulse 45, temperature 86.4 F (30.2 C), temperature source Rectal, resp. rate 16, height '5\' 6"'$  (1.676 m), weight 84.142 kg (185 lb 8 oz), SpO2 100 %.  PHYSICAL EXAMINATION:   VITAL SIGNS: Filed Vitals:   04/10/15 0530 2015-04-16 0526  BP: 89/41 67/45  Pulse: 44 45  Temp:    Resp: 17 16   GENERAL:82 y.o.female moderate distress given mental status.  HEAD: Normocephalic, atraumatic.  EYES: Pupils equal, round, reactive to light. Unable to assess extraocular muscles given mental status/medical condition. No scleral icterus.  MOUTH: Dry mucosal membrane. Dentition poor. No abscess noted.  EAR, NOSE, THROAT: Clear without exudates. No external lesions.  NECK: Supple. No thyromegaly. No nodules. No JVD.  PULMONARY: Clear to ascultation, without wheeze rails or rhonci. No use of accessory muscles, Good respiratory effort. good air entry bilaterally CHEST: Nontender to palpation.  CARDIOVASCULAR: S1 and S2. Regular rate and rhythm. No murmurs, rubs, or gallops. No edema. Pedal pulses 2+ bilaterally.  GASTROINTESTINAL: Soft, nontender, nondistended. No masses. Positive bowel sounds. No hepatosplenomegaly.  MUSCULOSKELETAL: No swelling, clubbing, or edema. Range of motion full in all extremities.  NEUROLOGIC: Unable to assess given mental status/medical condition SKIN: No ulceration, lesions, rashes, or cyanosis. Skin warm and dry. Turgor intact.  PSYCHIATRIC: Unable to assess given mental status/medical condition       LABORATORY PANEL:    CBC  Recent Labs Lab 03/31/2015 1315  WBC 5.2  HGB 7.2*  HCT 22.3*  PLT 110*   ------------------------------------------------------------------------------------------------------------------  Chemistries   Recent Labs Lab 04/04/2015 1315  NA 145  K 3.7  CL 109  CO2 27  GLUCOSE 93  BUN 49*  CREATININE 4.05*  CALCIUM 8.0*  AST 30  ALT 27  ALKPHOS 91  BILITOT 0.7   ------------------------------------------------------------------------------------------------------------------  Cardiac Enzymes  Recent Labs Lab 04/27/2015 1315  TROPONINI 0.03   ------------------------------------------------------------------------------------------------------------------  RADIOLOGY:  Dg Chest 1 View  04/17/2015  CLINICAL DATA:  82 year old female status post right thoracentesis for pleural effusion. EXAM: CHEST 1 VIEW COMPARISON:  04/26/2015 and prior radiographs FINDINGS: A right pleural effusion has decreased. There is no evidence of pneumothorax. This is a low volume film with continued bilateral lower lung consolidation/atelectasis. A small left pleural effusion is likely. A defibrillator pad overlying the chest is noted. No other changes identified. IMPRESSION: Decreased right pleural effusion following thoracentesis. No evidence of pneumothorax. Continued bilateral lower lung consolidation/atelectasis and probable small left effusion. Electronically Signed   By: Margarette Canada M.D.   On: 04/18/2015 17:31   Ct Head Wo Contrast  04/04/2015  CLINICAL DATA:  Unresponsive episode earlier today with altered level of consciousness since this episode. EXAM: CT HEAD WITHOUT CONTRAST TECHNIQUE: Contiguous axial images were obtained from the base of the skull through the vertex without intravenous contrast. COMPARISON:  April 06, 2013 FINDINGS: Moderate diffuse atrophy is stable. There is no intracranial mass, hemorrhage, extra-axial fluid collection, or midline shift. There is patchy small  vessel disease throughout the centra semiovale bilaterally, stable. There is no new gray-white compartment lesion. No acute infarct  is evident. The bony calvarium appears intact. The mastoid air cells are clear. No intraorbital lesions are apparent. There is calcification in the cavernous carotid artery regions bilaterally. There is left medial and right lateral sphenoid sinus disease. There is mucosal thickening in several ethmoid air cells bilaterally. IMPRESSION: Atrophy with periventricular small vessel disease, stable. No intracranial mass, hemorrhage, or extra-axial fluid. No evidence of acute appearing infarct. There are areas of paranasal sinus disease. Electronically Signed   By: Lowella Grip III M.D.   On: 04/08/2015 14:02   Dg Chest Portable 1 View  04/08/2015  CLINICAL DATA:  Bradycardia congestive heart failure atrial fibrillation dementia breast cancer EXAM: PORTABLE CHEST 1 VIEW COMPARISON:  03/06/2015 FINDINGS: Moderately to severely enlarged cardiac silhouette stable. Central line has been removed. There is vascular congestion. There is a moderate right pleural effusions similar to the prior study. There is underlying consolidation which may represent atelectasis or pneumonia. There is retrocardiac opacity on the left, mildly increased in conspicuity and severity. There is no definite evidence of pulmonary edema IMPRESSION: Moderate right pleural effusion. Underlying consolidation right mid to lower lung zone could represent pneumonia or atelectasis. Mild but increased left lower lobe opacity which could represent atelectasis or pneumonia. Electronically Signed   By: Skipper Cliche M.D.   On: 04/26/2015 14:25   US Thoracentesis Asp Pleural Space W/img Guide  03/31/2015  INDICATION: Right pleural effusion EXAM: ULTRASOUND GUIDED THORACENTESIS MEDICATIONS: None. ANESTHESIA/SEDATION: None COMPLICATIONS: None immediate. PROCEDURE: An ultrasound guided thoracentesis was thoroughly discussed  with the patient's power of attorney and questions answered. The benefits, risks, alternatives and complications were also discussed. The power of attorney understands and wishes to proceed with the procedure. Written consent was obtained. Ultrasound was performed to localize and mark an adequate pocket of fluid in the right chest. The area was then prepped and draped in the normal sterile fashion. 1% Lidocaine was used for local anesthesia. Under ultrasound guidance a 6 French thoracentesis catheter was introduced. Thoracentesis was performed. The catheter was removed and a dressing applied. FINDINGS: A total of approximately 700 mL of clear yellow fluid was removed. A fluid sample was sent for laboratory analysis. IMPRESSION: Successful ultrasound guided right thoracentesis yielding 700 mL of pleural fluid. Electronically Signed   By: Inez Catalina M.D.   On: 04/15/2015 17:10    EKG:   Orders placed or performed during the hospital encounter of 04/10/2015  . ED EKG  . ED EKG    ASSESSMENT AND PLAN:    82 year old African-American female history of dementia, atrial fibrillation chronic, congestive heart failure systolic who was noted to the hospital unresponsive state. She actually required atropine in the emergency department when she lost pulse. She has multiple medical issues has been made comfort measures  1. Acute on Chronic renal failure - with likely uremia 2. Acute on chronic respiratory failure with hypoxia 3. Disposition: The patient has been made comfort measures only continue with as needed medications, will consult palliative care as well as social work to aid in hospice placement      All the records are reviewed and case discussed with Care Management/Social Workerr. Management plans discussed with the patient, family and they are in agreement.  CODE STATUS: DO NOT RESUSCITATE/comfort measures  TOTAL TIME TAKING CARE OF THIS PATIENT: 28 minutes.   POSSIBLE D/C IN 1 DAYS,  DEPENDING ON CLINICAL CONDITION.   Shandreka Dante,  Karenann Cai.D on 04-29-2015 at 11:49 AM  Between 7am to 6pm - Pager -  [phone]  After 6pm: House Pager: - [phone]  [facility]  Office  [phone]  CC: Primary care physician; Pcp Not In System

## 2015-04-28 DEATH — deceased

## 2016-10-30 IMAGING — US US RENAL
1 series · 14 of 22 positions shown · non-contrast
Comparison: PET-CT dated 06/12/2011 and abdominal ultrasound dated
08/25/2011

CLINICAL DATA: Chronic renal failure.

EXAM:
RENAL / URINARY TRACT ULTRASOUND COMPLETE

[Series 1: us renal · 0.22mm/px · 14 of 22 slices shown]
[im 1/22]
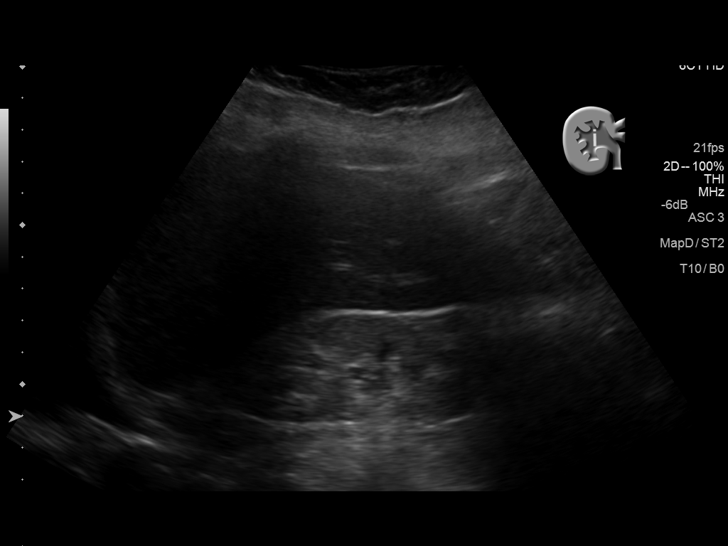
[im 3/22]
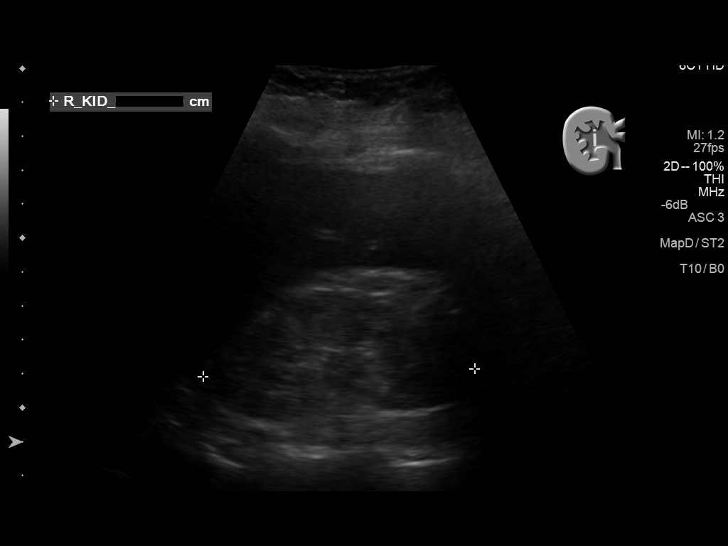
[im 4/22]
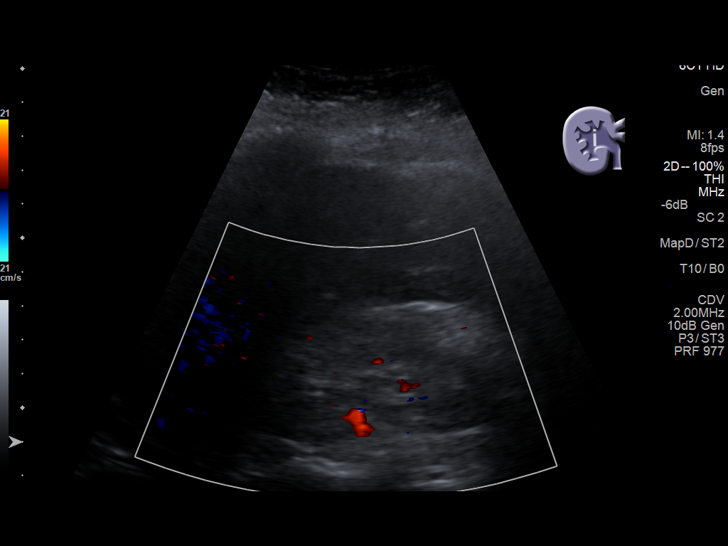
[im 6/22]
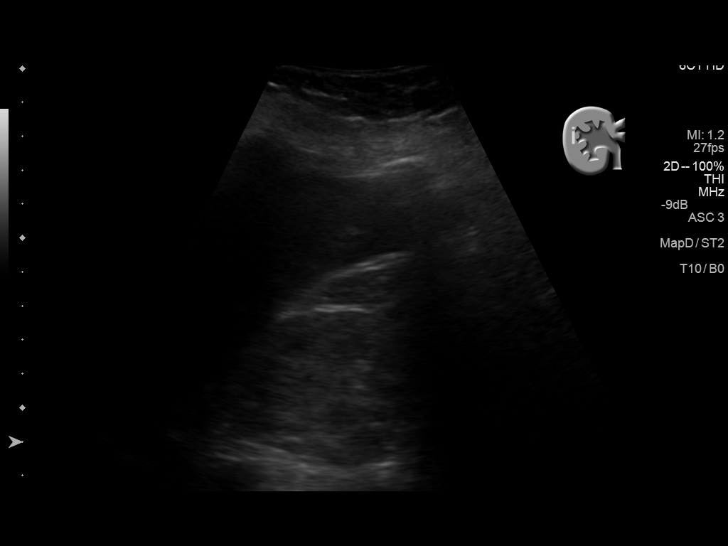
[im 8/22]
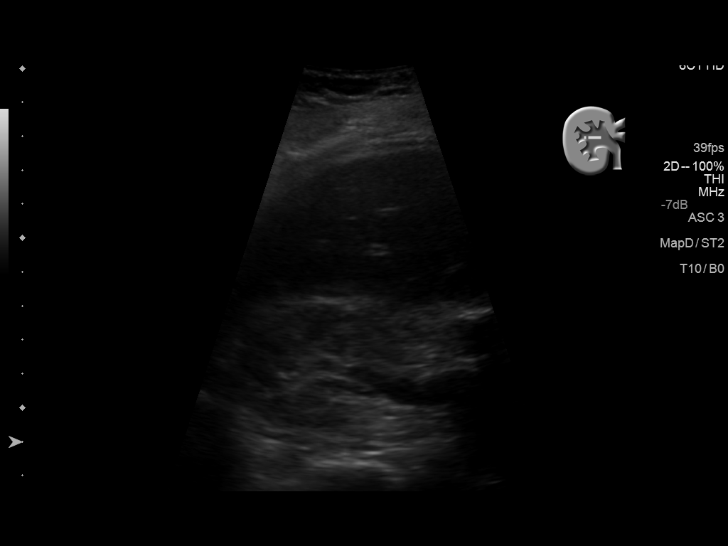
[im 9/22]
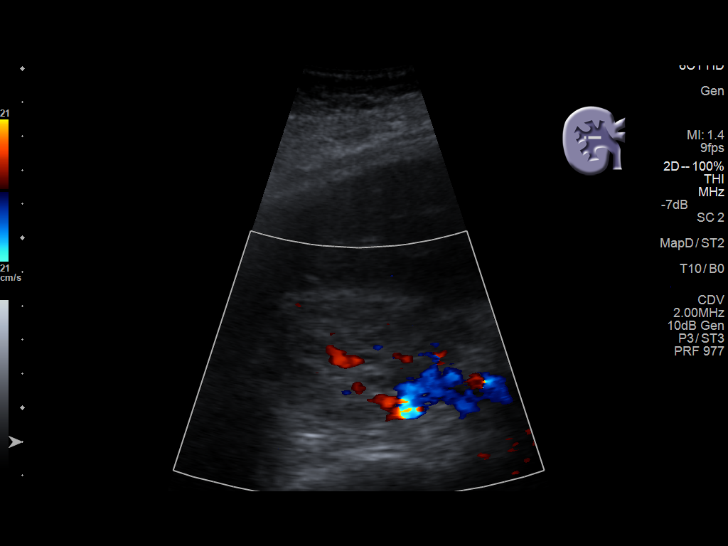
[im 11/22]
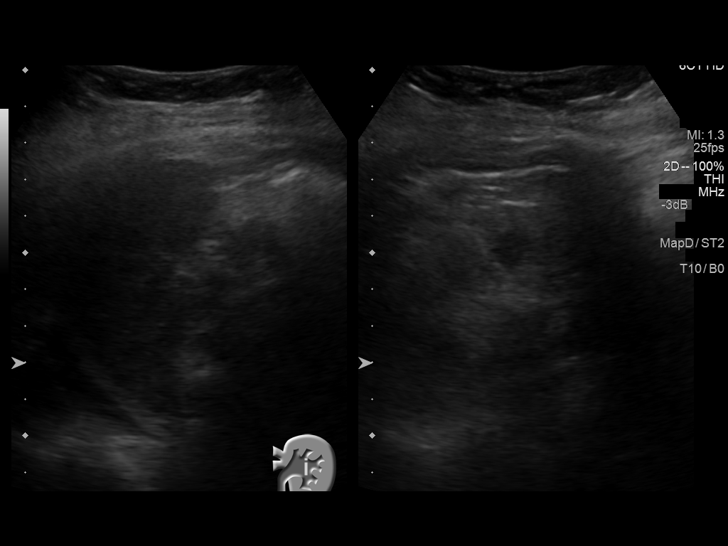
[im 12/22]
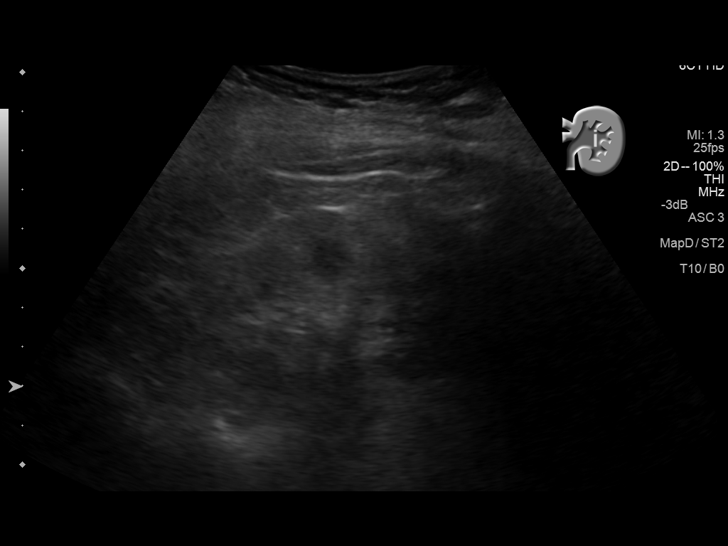
[im 14/22]
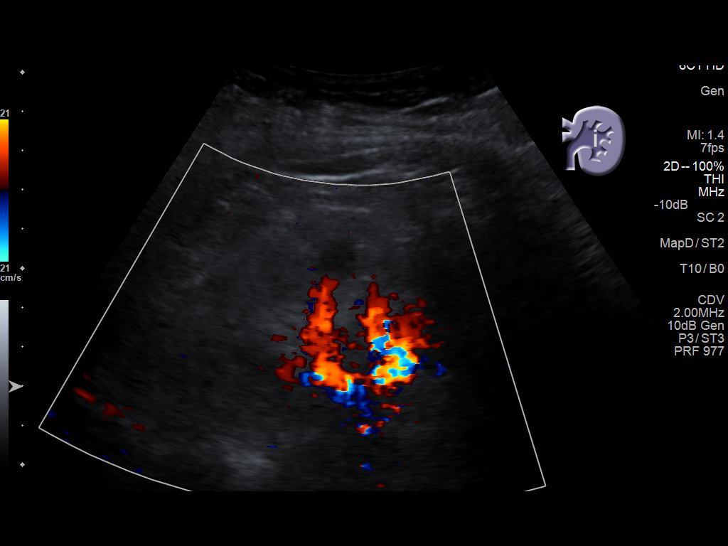
[im 15/22]
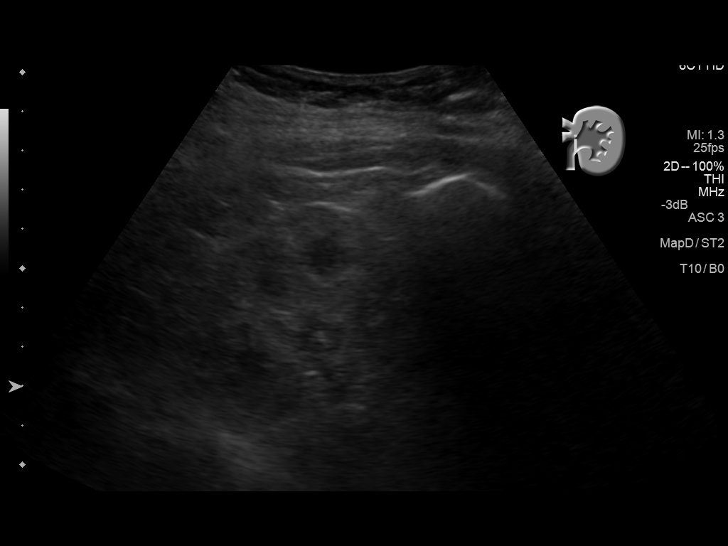
[im 17/22]
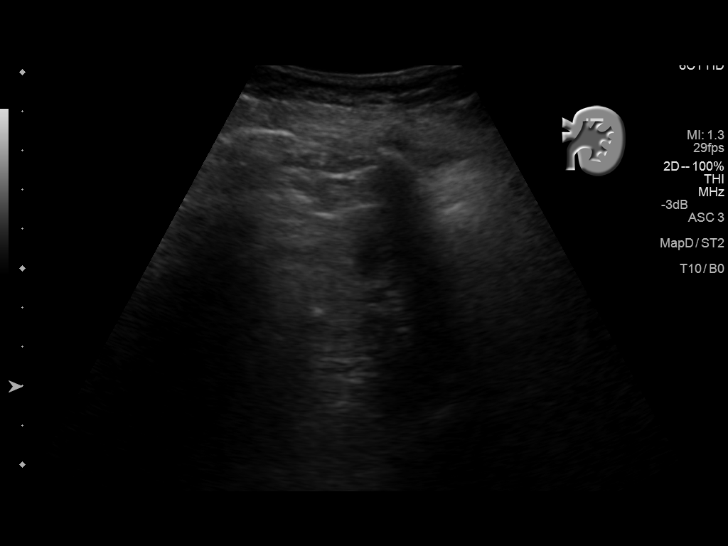
[im 19/22]
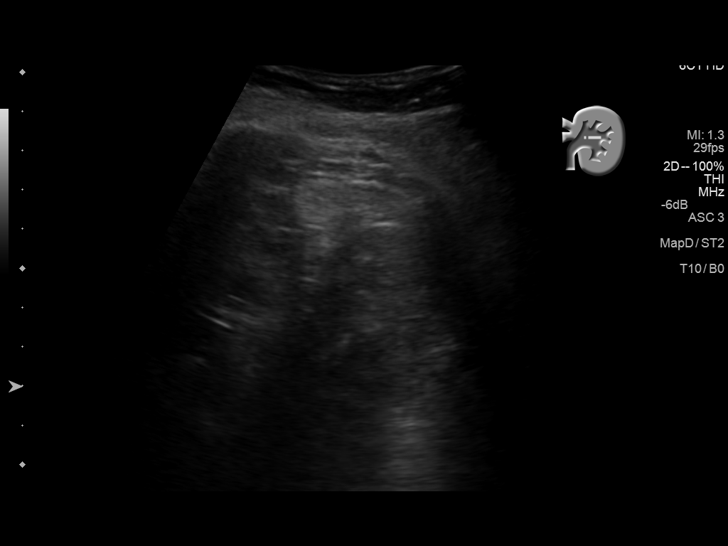
[im 20/22]
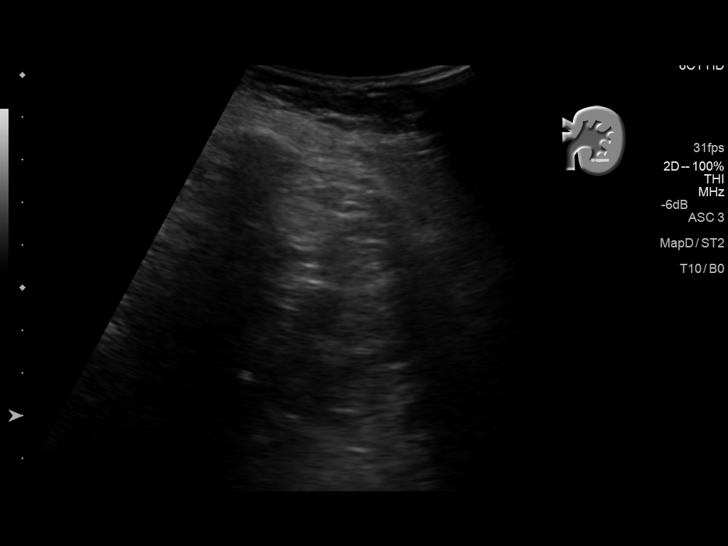
[im 22/22]
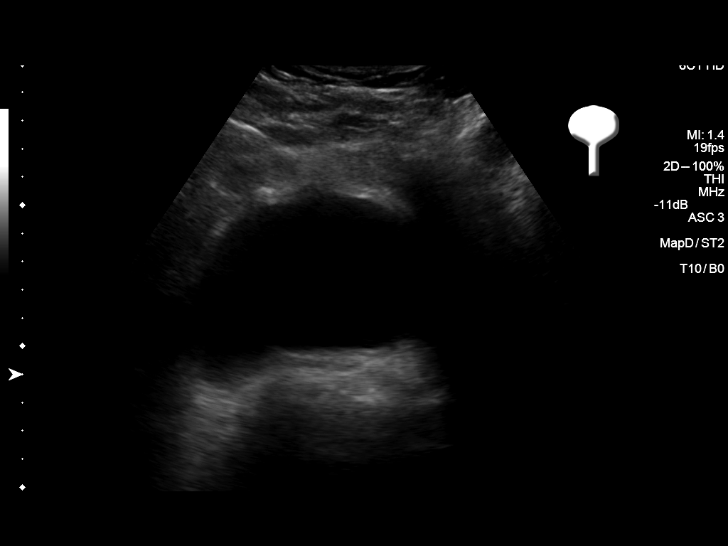

[14 of 22 positions shown; findings below may reference images not displayed]

FINDINGS: Right Kidney:

Length: 8.0 cm. Increased echogenicity of the renal parenchyma. No
hydronephrosis or mass lesion.

Left Kidney:

Length: 8.4 cm.. Increased echogenicity of the renal parenchyma. No
hydronephrosis or mass lesion.

Bladder:

Appears normal for degree of bladder distention.
IMPRESSION: Bilateral echogenic kidneys consistent with renal medical disease.
No obstruction.

## 2017-03-22 IMAGING — CR DG CHEST 1V PORT
1 series · 1 of 1 positions shown · non-contrast
Comparison: 12/10/2014

CLINICAL DATA: Dyspnea

EXAM:
PORTABLE CHEST 1 VIEW

[portable]
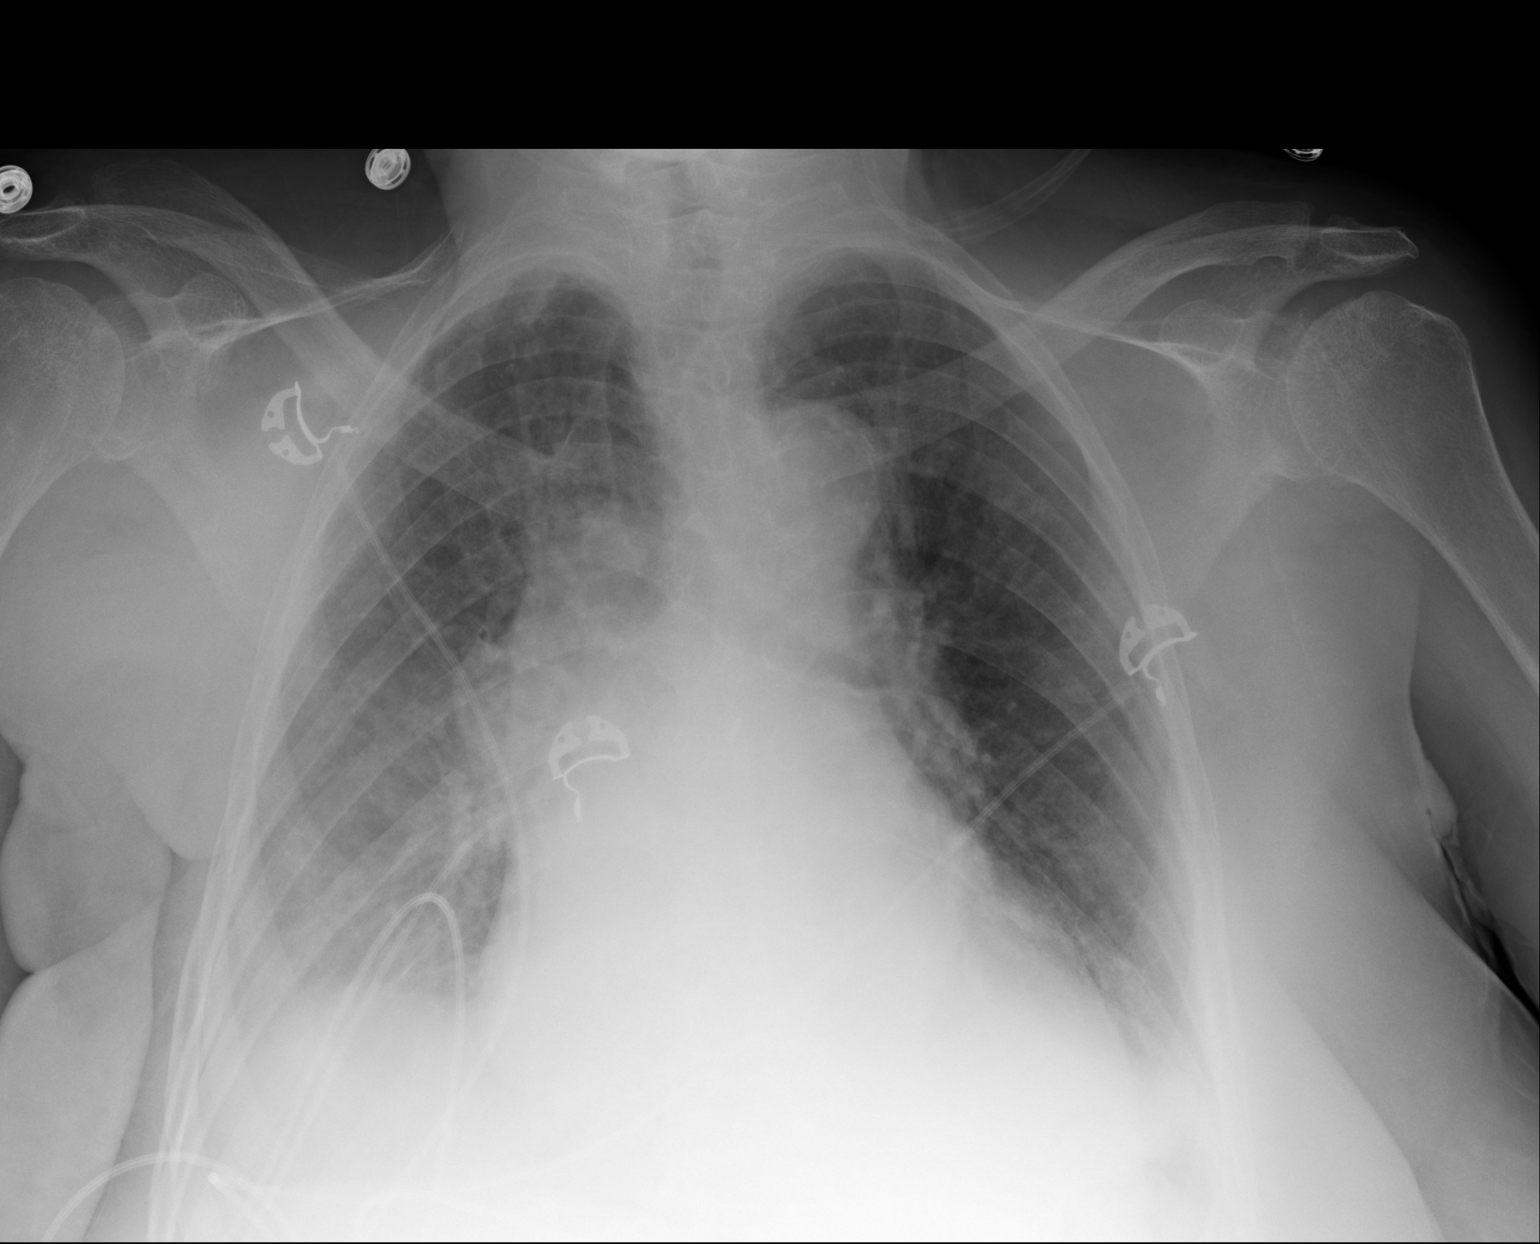

[1 of 1 positions shown; findings below may reference images not displayed]

FINDINGS: Cardiomegaly. There is small right pleural effusion with right lower
lobe atelectasis or infiltrate. Mild left basilar atelectasis. No
pulmonary edema.
IMPRESSION: Small right pleural effusion with right lower lobe atelectasis or
infiltrate. Mild left basilar atelectasis. No pulmonary edema.

## 2017-03-24 IMAGING — CR DG CHEST 1V
1 series · 1 of 1 positions shown · non-contrast
Comparison: Prior chest x-ray 12/15/2014

CLINICAL DATA: [AGE] female status post right-sided
thoracentesis

EXAM:
CHEST 1 VIEW

[chest ap]
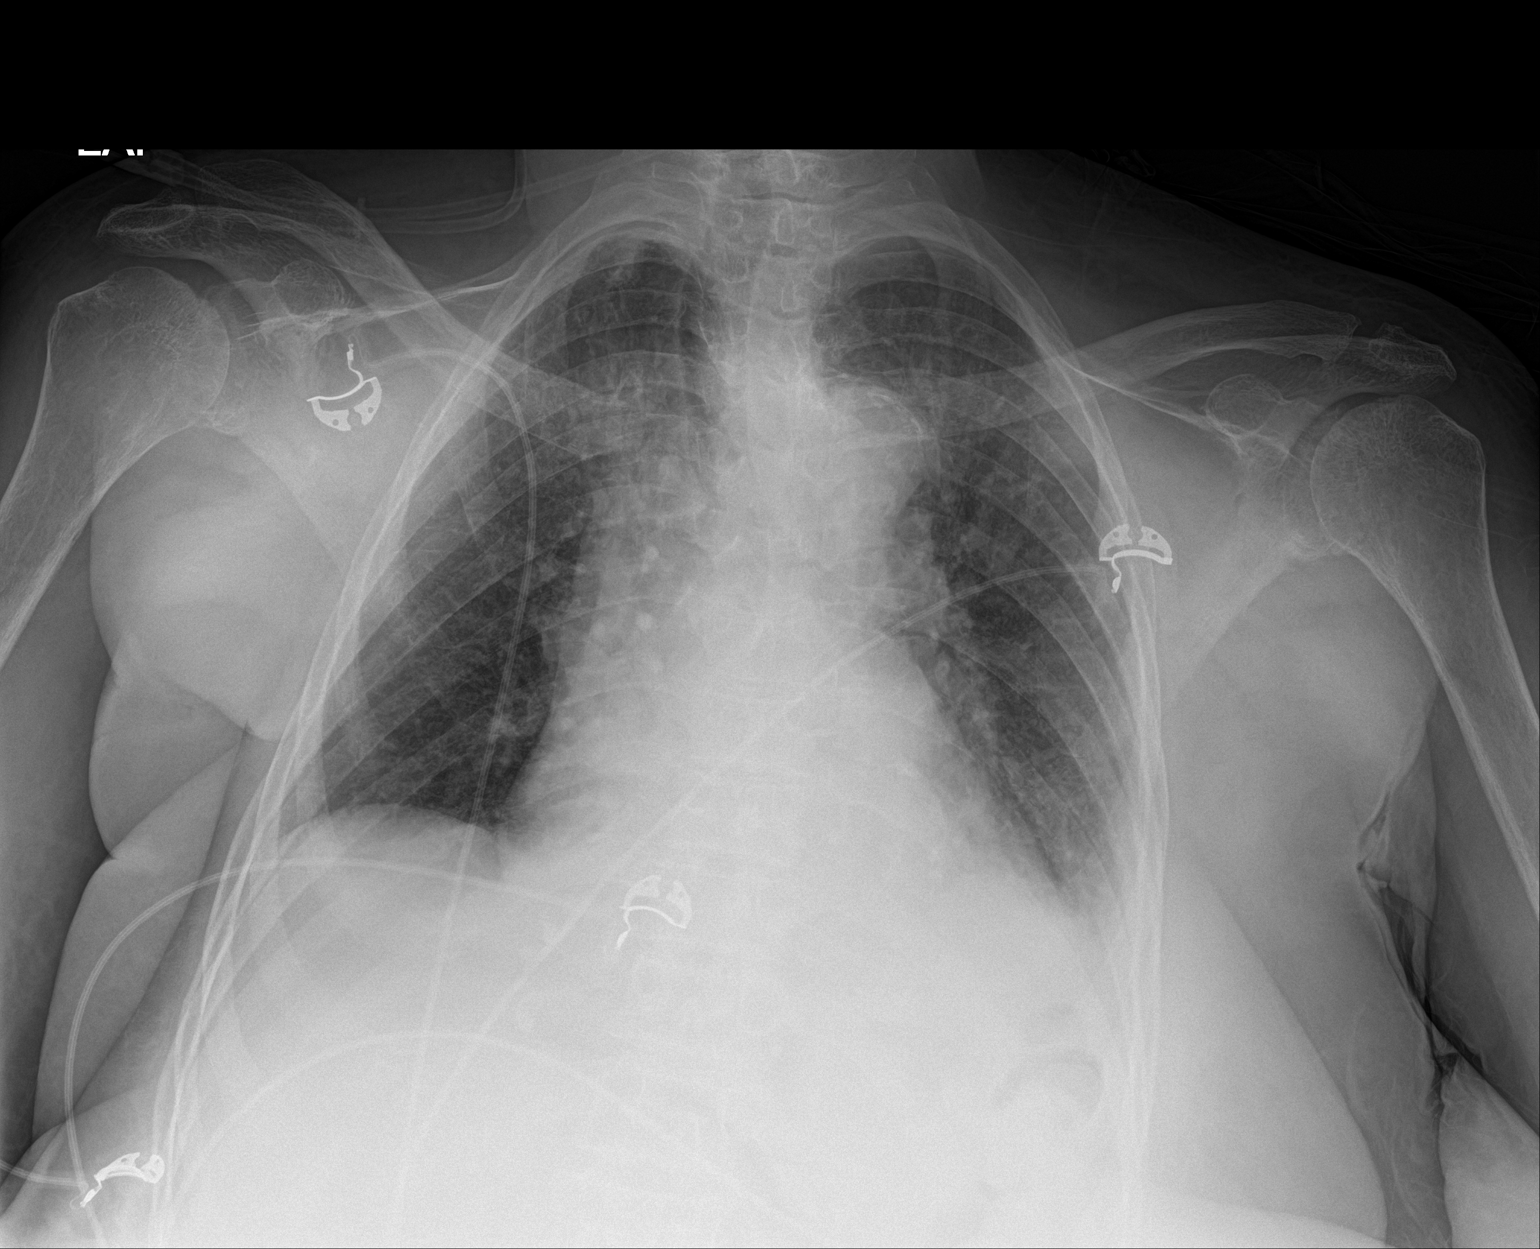

[1 of 1 positions shown; findings below may reference images not displayed]

FINDINGS: Stable cardiomegaly. Aortic atherosclerosis. No evidence of
right-sided pneumothorax. Total interval resolution of right pleural
effusion. Persistent small left pleural effusion with associated
basilar atelectasis.
IMPRESSION: Interval resolution of right-sided pleural effusion following
thoracentesis.

No evidence of pneumothorax.

Persistent small left layering effusion and associated basilar
atelectasis.

## 2017-03-28 IMAGING — CR DG HIP (WITH OR WITHOUT PELVIS) 2-3V*L*
1 series · 3 of 3 positions shown · non-contrast
Comparison: None.

CLINICAL DATA: Pain and inability to stand

EXAM:
DG HIP (WITH OR WITHOUT PELVIS) 2-3V LEFT

[Series 1: dg hip unilat w or w/o pelvis 2-3 views  · non-contrast · 0.14mm/px · 3 of 3 slices shown]
[im 1/3]
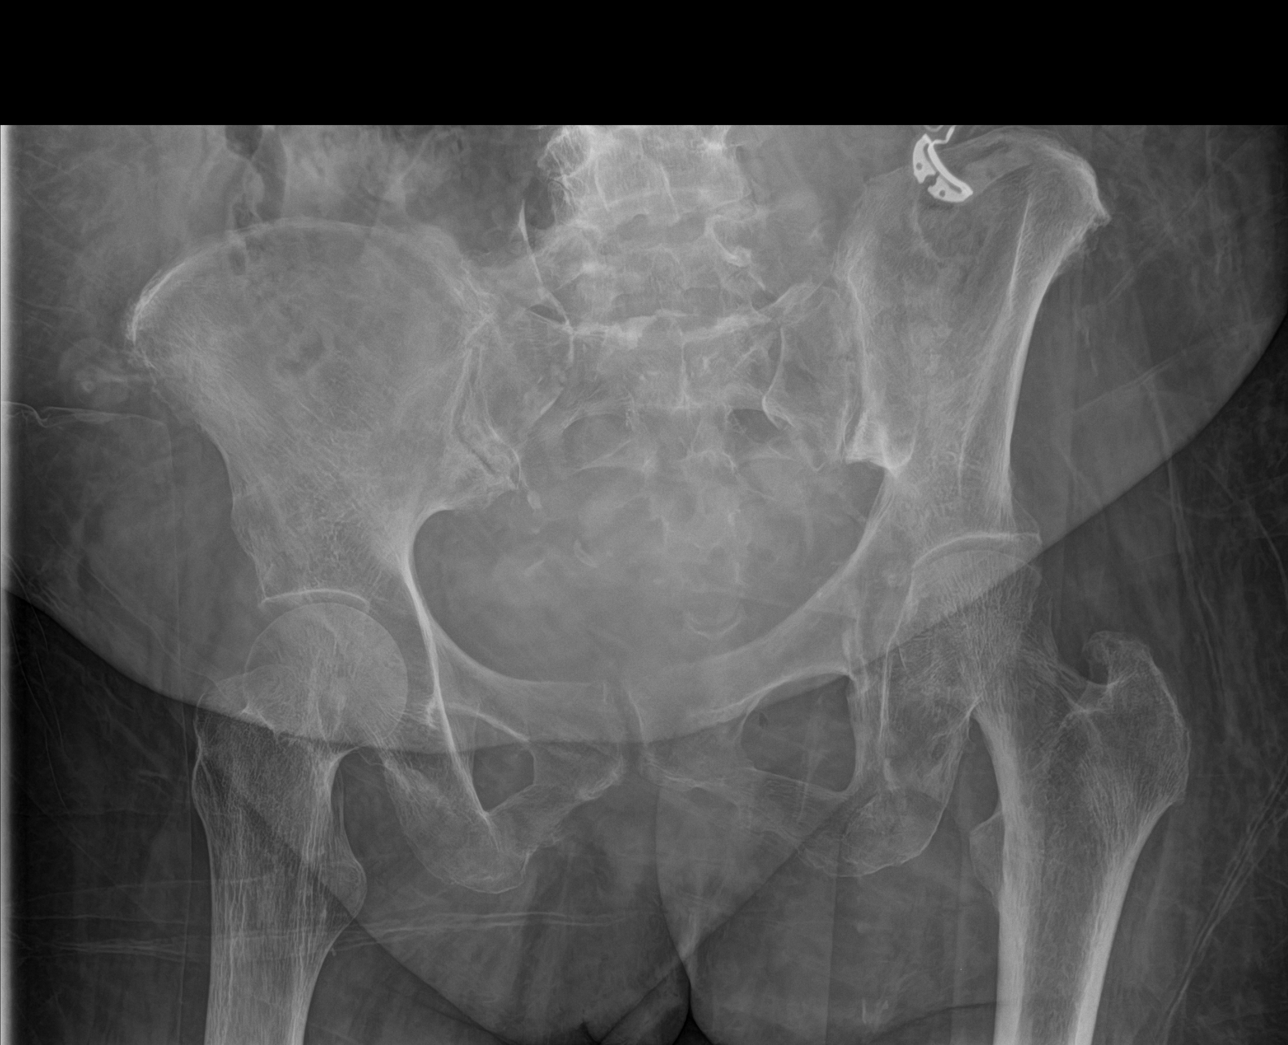
[im 2/3]
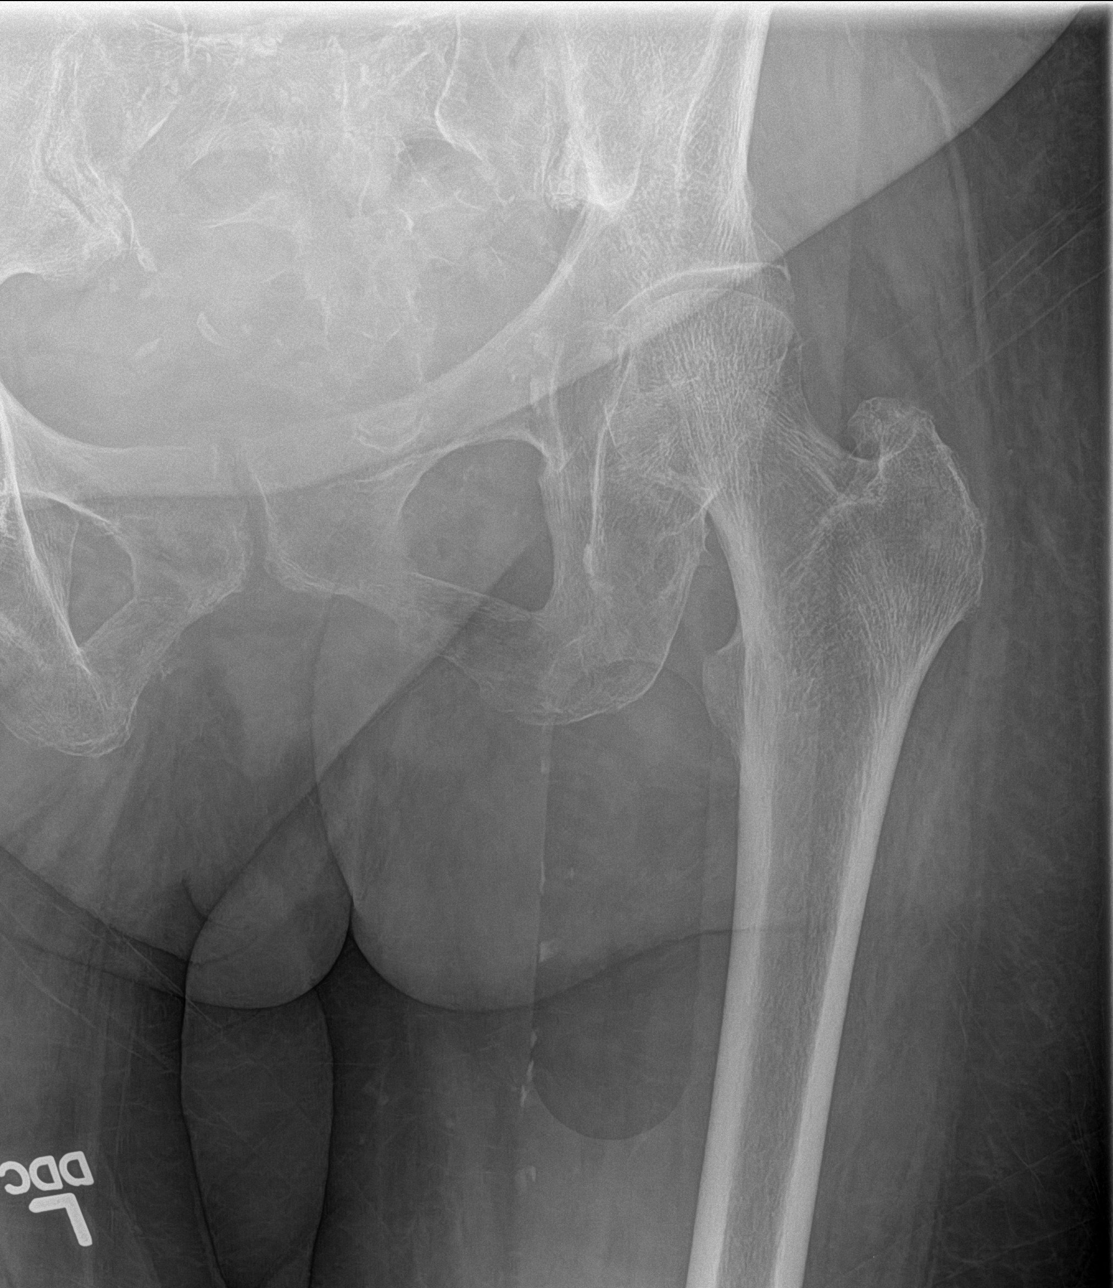
[im 3/3]
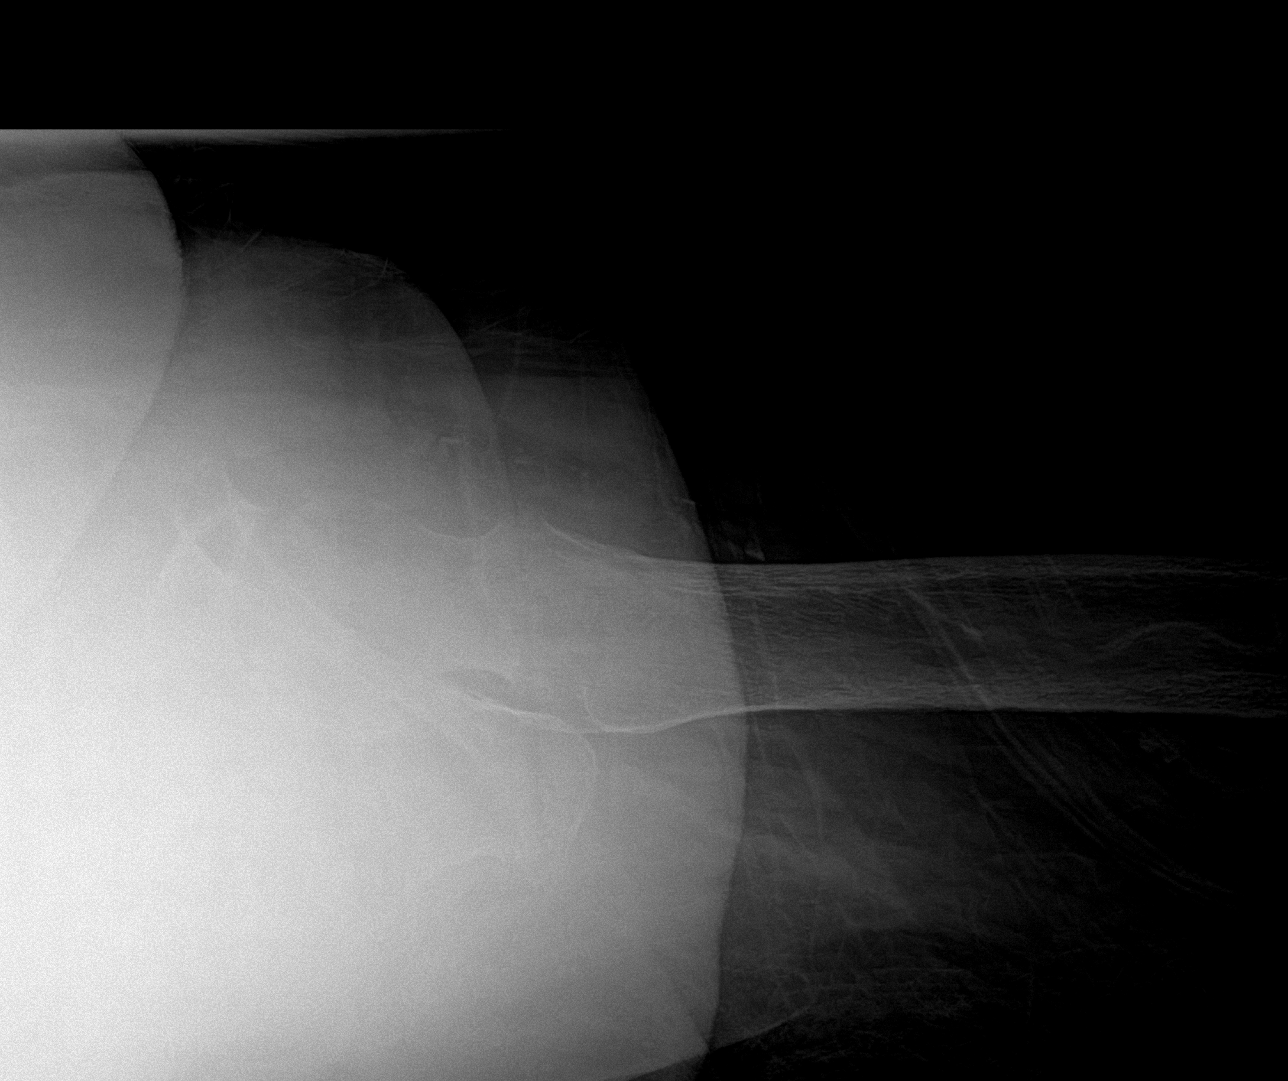

[3 of 3 positions shown; findings below may reference images not displayed]

FINDINGS: Frontal pelvis as well as frontal and lateral left hip images were
obtained. Bones are osteoporotic. There is no demonstrable fracture
or dislocation. There is mild symmetric narrowing of both hip
joints. No erosive change. There are scattered foci of arterial
vascular calcification. There is evidence of an aneurysm of the
distal common iliac artery on the right measuring 3.3 cm.
IMPRESSION: Distal right common iliac artery aneurysm. No fracture or
dislocation. Bones osteoporotic. Narrowing both hip joints,
symmetric.

## 2017-03-28 IMAGING — CR DG CHEST 1V
1 series · 1 of 1 positions shown · non-contrast
Comparison: 12/17/2014.

CLINICAL DATA: Hip pain.  Renal disorder.

EXAM:
CHEST 1 VIEW

[dg chest 2 view]
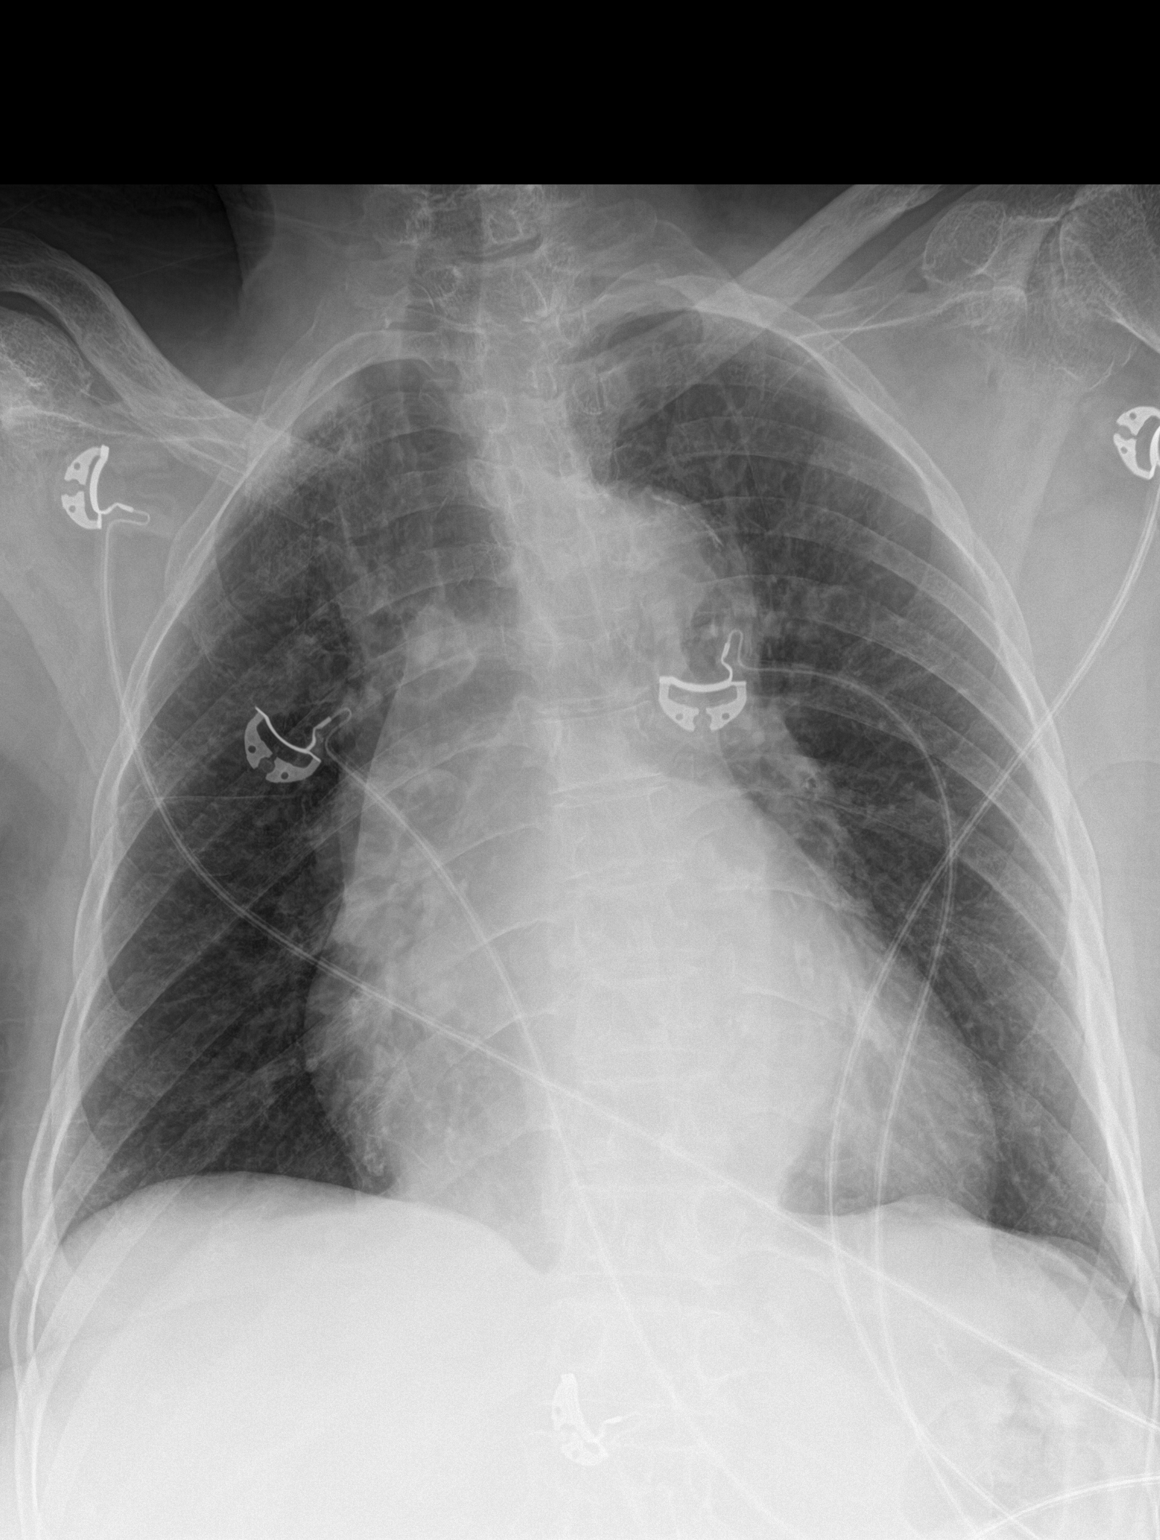

[1 of 1 positions shown; findings below may reference images not displayed]

FINDINGS: Improved inspiration. Progressive enlargement of the cardiac
silhouette. Stable prominence of the pulmonary vasculature and
interstitial markings. No pleural fluid. Mild scoliosis. Diffuse
osteopenia.
IMPRESSION: 1. Progressive cardiomegaly.
2. Improved inspiration with grossly stable mild pulmonary vascular
congestion and chronic interstitial lung disease.
